# Patient Record
Sex: Female | Born: 1980 | Hispanic: Yes | Marital: Married | Smoking: Never smoker
Health system: Southern US, Community
[De-identification: ages and names within clinical notes are randomized; demographics above are authoritative.]

## PROBLEM LIST (undated history)

## (undated) DIAGNOSIS — K219 Gastro-esophageal reflux disease without esophagitis: Secondary | ICD-10-CM

## (undated) DIAGNOSIS — D649 Anemia, unspecified: Secondary | ICD-10-CM

## (undated) DIAGNOSIS — D219 Benign neoplasm of connective and other soft tissue, unspecified: Secondary | ICD-10-CM

## (undated) DIAGNOSIS — O26649 Intrahepatic cholestasis of pregnancy, unspecified trimester: Secondary | ICD-10-CM

## (undated) DIAGNOSIS — B20 Human immunodeficiency virus [HIV] disease: Secondary | ICD-10-CM

## (undated) DIAGNOSIS — O24419 Gestational diabetes mellitus in pregnancy, unspecified control: Secondary | ICD-10-CM

## (undated) DIAGNOSIS — K831 Obstruction of bile duct: Secondary | ICD-10-CM

## (undated) DIAGNOSIS — Z5189 Encounter for other specified aftercare: Secondary | ICD-10-CM

## (undated) DIAGNOSIS — Z21 Asymptomatic human immunodeficiency virus [HIV] infection status: Secondary | ICD-10-CM

## (undated) DIAGNOSIS — O26619 Liver and biliary tract disorders in pregnancy, unspecified trimester: Secondary | ICD-10-CM

## (undated) HISTORY — PX: APPENDECTOMY: SHX54

## (undated) HISTORY — DX: Anemia, unspecified: D64.9

## (undated) HISTORY — DX: Gastro-esophageal reflux disease without esophagitis: K21.9

## (undated) HISTORY — DX: Benign neoplasm of connective and other soft tissue, unspecified: D21.9

## (undated) HISTORY — DX: Encounter for other specified aftercare: Z51.89

---
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
## 2019-01-14 ENCOUNTER — Ambulatory Visit: Payer: Self-pay | Attending: Internal Medicine

## 2019-01-14 DIAGNOSIS — Z20822 Contact with and (suspected) exposure to covid-19: Secondary | ICD-10-CM

## 2019-01-14 DIAGNOSIS — U071 COVID-19: Secondary | ICD-10-CM | POA: Insufficient documentation

## 2019-01-15 LAB — NOVEL CORONAVIRUS, NAA: SARS-CoV-2, NAA: DETECTED — AB

## 2019-03-17 ENCOUNTER — Emergency Department (HOSPITAL_COMMUNITY): Payer: Self-pay

## 2019-03-17 ENCOUNTER — Other Ambulatory Visit: Payer: Self-pay

## 2019-03-17 ENCOUNTER — Encounter (HOSPITAL_COMMUNITY): Payer: Self-pay

## 2019-03-17 ENCOUNTER — Emergency Department (HOSPITAL_COMMUNITY)
Admission: EM | Admit: 2019-03-17 | Discharge: 2019-03-17 | Disposition: A | Discharge status: Home / Self Care | Payer: Self-pay | Attending: Emergency Medicine | Admitting: Emergency Medicine

## 2019-03-17 DIAGNOSIS — Z79899 Other long term (current) drug therapy: Secondary | ICD-10-CM | POA: Insufficient documentation

## 2019-03-17 DIAGNOSIS — D259 Leiomyoma of uterus, unspecified: Secondary | ICD-10-CM | POA: Insufficient documentation

## 2019-03-17 DIAGNOSIS — R1030 Lower abdominal pain, unspecified: Secondary | ICD-10-CM | POA: Insufficient documentation

## 2019-03-17 LAB — URINALYSIS, ROUTINE W REFLEX MICROSCOPIC
Bilirubin Urine: NEGATIVE
Glucose, UA: NEGATIVE mg/dL
Hgb urine dipstick: NEGATIVE
Ketones, ur: NEGATIVE mg/dL
Leukocytes,Ua: NEGATIVE
Nitrite: POSITIVE — AB
Protein, ur: NEGATIVE mg/dL
Specific Gravity, Urine: 1.013 (ref 1.005–1.030)
pH: 5 (ref 5.0–8.0)

## 2019-03-17 LAB — CBC
HCT: 41.3 % (ref 36.0–46.0)
Hemoglobin: 12.9 g/dL (ref 12.0–15.0)
MCH: 26.3 pg (ref 26.0–34.0)
MCHC: 31.2 g/dL (ref 30.0–36.0)
MCV: 84.1 fL (ref 80.0–100.0)
Platelets: 233 10*3/uL (ref 150–400)
RBC: 4.91 MIL/uL (ref 3.87–5.11)
RDW: 14.6 % (ref 11.5–15.5)
WBC: 4.4 10*3/uL (ref 4.0–10.5)
nRBC: 0 % (ref 0.0–0.2)

## 2019-03-17 LAB — COMPREHENSIVE METABOLIC PANEL
ALT: 113 U/L — ABNORMAL HIGH (ref 0–44)
AST: 96 U/L — ABNORMAL HIGH (ref 15–41)
Albumin: 3.8 g/dL (ref 3.5–5.0)
Alkaline Phosphatase: 59 U/L (ref 38–126)
Anion gap: 8 (ref 5–15)
BUN: 11 mg/dL (ref 6–20)
CO2: 20 mmol/L — ABNORMAL LOW (ref 22–32)
Calcium: 8.9 mg/dL (ref 8.9–10.3)
Chloride: 108 mmol/L (ref 98–111)
Creatinine, Ser: 0.72 mg/dL (ref 0.44–1.00)
GFR calc Af Amer: >60 mL/min (ref >60)
GFR calc non Af Amer: >60 mL/min (ref >60)
Glucose, Bld: 106 mg/dL — ABNORMAL HIGH (ref 70–99)
Potassium: 4.9 mmol/L (ref 3.5–5.1)
Sodium: 136 mmol/L (ref 135–145)
Total Bilirubin: 0.9 mg/dL (ref 0.3–1.2)
Total Protein: 7.1 g/dL (ref 6.5–8.1)

## 2019-03-17 LAB — WET PREP, GENITAL
Clue Cells Wet Prep HPF POC: NONE SEEN
Sperm: NONE SEEN
Trich, Wet Prep: NONE SEEN
Yeast Wet Prep HPF POC: NONE SEEN

## 2019-03-17 LAB — LIPASE, BLOOD: Lipase: 32 U/L (ref 11–51)

## 2019-03-17 LAB — I-STAT BETA HCG BLOOD, ED (MC, WL, AP ONLY): I-stat hCG, quantitative: <5 m[IU]/mL (ref <5)

## 2019-03-17 LAB — HIV ANTIBODY (ROUTINE TESTING W REFLEX): HIV Screen 4th Generation wRfx: REACTIVE — AB

## 2019-03-17 MED ORDER — IOHEXOL 300 MG/ML  SOLN
100.0000 mL | Freq: Once | INTRAMUSCULAR | Status: AC | PRN
  Administered 2019-03-17: 100 mL via INTRAVENOUS

## 2019-03-17 MED ORDER — MORPHINE SULFATE (PF) 4 MG/ML IV SOLN
4.0000 mg | Freq: Once | INTRAVENOUS | Status: AC
  Administered 2019-03-17: 4 mg via INTRAVENOUS
  Filled 2019-03-17: qty 1

## 2019-03-17 MED ORDER — KETOROLAC TROMETHAMINE 30 MG/ML IJ SOLN
30.0000 mg | Freq: Once | INTRAMUSCULAR | Status: AC
  Administered 2019-03-17: 30 mg via INTRAVENOUS
  Filled 2019-03-17: qty 1

## 2019-03-17 NOTE — ED Provider Notes (Signed)
[place] EMERGENCY DEPARTMENT Provider Note   CSN: [id number] Arrival date & time: 03/17/19  1013     History Chief Complaint  Patient presents with  . Abdominal Pain    Miranda George is a 39 y.o. female presents for evaluation of acute onset, progressively worsening left lower quadrant abdominal pain.  Reports that symptoms began a couple of months ago but were intermittent, over the last 2 weeks the pain has been constant and severely worsened last night.  Pain is sharp, radiates to the lower back.  It worsens with ambulation and certain movements and bending.  Denies fevers, nausea, vomiting, chest pain, shortness of breath, diarrhea or constipation.  No urinary symptoms.  She denies any vaginal itching, bleeding, or discharge out of the ordinary.  Has been taking Tylenol without relief of symptoms.  She is primarily Spanish-speaking and a Optometrist was used throughout the encounter.  The history is provided by the patient. The history is limited by a language barrier. A language interpreter was used.       History reviewed. No pertinent past medical history.  There are no problems to display for this patient.   Past Surgical History:  Procedure Laterality Date  . APPENDECTOMY     18 years ago  . CESAREAN SECTION  2012     OB History   No obstetric history on file.     No family history on file.  Social History   Tobacco Use  . Smoking status: Not on file  Substance Use Topics  . Alcohol use: Not on file  . Drug use: Not on file    Home Medications Prior to Admission medications   Medication Sig Start Date End Date Taking? Authorizing Provider  acetaminophen (TYLENOL) 500 MG tablet Take 500-1,000 mg by mouth every 6 (six) hours as needed for mild pain.   Yes [provider]    Allergies    Patient has no known allergies.  Review of Systems   Review of Systems  Constitutional: Negative for chills and fever.  Respiratory:  Negative for shortness of breath.   Cardiovascular: Negative for chest pain.  Gastrointestinal: Positive for abdominal pain. Negative for constipation, diarrhea, nausea and vomiting.  Genitourinary: Negative for dysuria, frequency, hematuria, urgency, vaginal bleeding, vaginal discharge and vaginal pain.  All other systems reviewed and are negative.   Physical Exam Updated Vital Signs BP 118/66   Pulse 75   Temp (!) 97.5 F (36.4 C) (Oral)   Resp 20   Ht 5' (1.524 m)   Wt 74.8 kg   LMP 02/20/2019   SpO2 99%   BMI 32.22 kg/m   Physical Exam Vitals and nursing note reviewed.  Constitutional:      General: She is not in acute distress.    Appearance: She is well-developed. She is obese.  HENT:     Head: Normocephalic and atraumatic.  Eyes:     General:        Right eye: No discharge.        Left eye: No discharge.     Conjunctiva/sclera: Conjunctivae normal.  Neck:     Vascular: No JVD.     Trachea: No tracheal deviation.  Cardiovascular:     Rate and Rhythm: Normal rate and regular rhythm.  Pulmonary:     Effort: Pulmonary effort is normal.     Breath sounds: Normal breath sounds.  Abdominal:     General: Abdomen is flat. Bowel sounds are normal. There is  no distension.     Palpations: Abdomen is soft.     Tenderness: There is abdominal tenderness in the suprapubic area and left lower quadrant. There is right CVA tenderness, left CVA tenderness and guarding. There is no rebound. Negative signs include Murphy's sign and Rovsing's sign.     Comments: Voluntary guarding noted  Genitourinary:    Uterus: Enlarged. Not tender.      Comments: Examination performed in the presence of a chaperone.  No masses or lesions to the external genitalia.  Moderate amount of thin clear yellow discharge.  No cervical motion tenderness or adnexal tenderness.  Uterus does appear mildly enlarged palpation Skin:    General: Skin is warm and dry.     Findings: No erythema.  Neurological:      Mental Status: She is alert.  Psychiatric:        Behavior: Behavior normal.     ED Results / Procedures / Treatments   Labs (all labs ordered are listed, but only abnormal results are displayed) Labs Reviewed  WET PREP, GENITAL - Abnormal; Notable for the following components:      Result Value   WBC, Wet Prep HPF POC MANY (*)    All other components within normal limits  COMPREHENSIVE METABOLIC PANEL - Abnormal; Notable for the following components:   CO2 20 (*)    Glucose, Bld 106 (*)    AST 96 (*)    ALT 113 (*)    All other components within normal limits  URINALYSIS, ROUTINE W REFLEX MICROSCOPIC - Abnormal; Notable for the following components:   Nitrite POSITIVE (*)    Bacteria, UA RARE (*)    All other components within normal limits  URINE CULTURE  LIPASE, BLOOD  CBC  RPR  HIV ANTIBODY (ROUTINE TESTING W REFLEX)  I-STAT BETA HCG BLOOD, ED ([facility], [facility], [facility] ONLY)  GC/CHLAMYDIA PROBE AMP ([place]) NOT AT [facility]    EKG None  Radiology CT ABDOMEN PELVIS W CONTRAST  Result Date: 03/17/2019 CLINICAL DATA:  Acute on chronic left lower quadrant abdominal pain. EXAM: CT ABDOMEN AND PELVIS WITH CONTRAST TECHNIQUE: Multidetector CT imaging of the abdomen and pelvis was performed using the standard protocol following bolus administration of intravenous contrast. CONTRAST:  135mL OMNIPAQUE IOHEXOL 300 MG/ML  SOLN COMPARISON:  None. FINDINGS: Lower chest: No acute abnormality. Hepatobiliary: No focal liver abnormality is seen. No gallstones, gallbladder wall thickening, or biliary dilatation. Pancreas: Unremarkable. No pancreatic ductal dilatation or surrounding inflammatory changes. Spleen: Normal in size without focal abnormality. Adrenals/Urinary Tract: Adrenal glands are unremarkable. Kidneys are normal, without renal calculi, focal lesion, or hydronephrosis. Bladder is unremarkable. Stomach/Bowel: The stomach appears normal. There is no evidence of bowel obstruction or  inflammation. Patient is status post appendectomy. Vascular/Lymphatic: No significant vascular findings are present. No enlarged abdominal or pelvic lymph nodes. Reproductive: 4 cm uterine fibroid is noted. No significant adnexal abnormality is noted. Other: No abdominal wall hernia or abnormality. No abdominopelvic ascites. Musculoskeletal: No acute or significant osseous findings. IMPRESSION: 4 cm uterine fibroid. No other abnormality seen in the abdomen or pelvis. Electronically Signed   By: Marijo Conception M.D.   On: 03/17/2019 14:47    Procedures Procedures (including critical care time)  Medications Ordered in ED Medications  morphine 4 MG/ML injection 4 mg (4 mg Intravenous Given 03/17/19 1336)  iohexol (OMNIPAQUE) 300 MG/ML solution 100 mL (100 mLs Intravenous Contrast Given 03/17/19 1432)    ED Course  I have reviewed the triage  vital signs and the nursing notes.  Pertinent labs & imaging results that were available during my care of the patient were reviewed by me and considered in my medical decision making (see chart for details).    MDM Rules/Calculators/A&P                      Patient presenting for evaluation of lower abdominal pain intermittently for several months, worsening over the last 2 weeks and severe since yesterday.  She is afebrile, vital signs are stable.  She is nontoxic in appearance.  No peritoneal signs on examination of the abdomen.  Lab work reviewed and interpreted by myself shows no leukocytosis, no anemia, no metabolic derangements, no renal insufficiency.  Her LFTs are mildly elevated though she has no right upper quadrant abdominal pain and her CT scan shows no evidence of any hepatobiliary disease.  I am unsure of the clinical significance but I do not feel that it is contributing to her symptoms.  Her CT scan shows a 4 cm uterine fibroid could certainly be causing her symptoms.  Pelvic examination is not concerning for PID, TOA, ovarian torsion, or ectopic  pregnancy.  Wet prep does not show evidence of yeast or BV.  No evidence of acute surgical abdominal pathology including obstruction, perforation, appendicitis, cholecystitis, dissection, or diverticulitis.  On reevaluation patient resting comfortably in no apparent distress, reports that she has had improvement in her pain.  Serial abdominal examinations remain benign.  Recommend follow-up with OB/GYN for reevaluation of symptoms.  Discussed strict ED return precautions. Patient verbalized understanding of and agreement with plan and is safe for discharge home at this time.  A translator was used throughout the encounter.   Final Clinical Impression(s) / ED Diagnoses Final diagnoses:  Lower abdominal pain  Uterine leiomyoma, unspecified location    Rx / DC Orders ED Discharge Orders    None       Debroah Baller 03/17/19 1620    Truddie Hidden, MD 03/17/19 1622

## 2019-03-17 NOTE — ED Triage Notes (Signed)
Spanish interpreter used for triage:  Pt reports intermittent LLQ pain for weeks now. Denies n/v. Denies vaginal bleeding or discharge.  LMP 2/5.

## 2019-03-17 NOTE — Discharge Instructions (Addendum)
Su tomografa computarizada mostr evidencia de fibromas uterinos. Puede tomar de 1 a 2 tabletas de Tylenol (350 mg-1000 mg dependiendo de la dosis) cada 6 horas segn sea necesario para el dolor. No exceda los 4000 mg de Tylenol al da. Si el dolor persiste, puede tomar una dosis de ibuprofeno entre dosis de Tylenol. Por lo general, recomiendo de 400 a 600 mg de ibuprofeno cada 6 horas. Tmelo con comida para evitar problemas de estmago. Haga un seguimiento con un obstetra / gineclogo para reevaluar sus sntomas. Regrese al departamento de emergencias si se presentan signos o sntomas preocupantes, como fiebre alta, vmitos persistentes o dolor intenso que empeora.  Your CT scan showed evidence of uterine fibroids. You can take 1 to 2 tablets of Tylenol (350mg -1000mg  depending on the dose) every 6 hours as needed for pain.  Do not exceed 4000 mg of Tylenol daily.  If your pain persists you can take a dose of ibuprofen in between doses of Tylenol.  I usually recommend 400 to 600 mg of ibuprofen every 6 hours.  Take this with food to avoid upset stomach issues. Follow-up with OB/GYN for reevaluation of your symptoms.  Return to the emergency department if any concerning signs or symptoms develop such as high fevers, persistent vomiting, or severe worsening pain.

## 2019-03-18 LAB — RPR: RPR Ser Ql: NONREACTIVE

## 2019-03-18 LAB — GC/CHLAMYDIA PROBE AMP (~~LOC~~) NOT AT ARMC
Chlamydia: NEGATIVE
Neisseria Gonorrhea: NEGATIVE

## 2019-03-19 ENCOUNTER — Telehealth: Payer: Self-pay

## 2019-03-19 LAB — URINE CULTURE: Culture: >=100000 — AB

## 2019-03-19 NOTE — Telephone Encounter (Signed)
Spanish patient called , called [place] id# [id number] to assist, patient went to ED on Tuesday 05/17/19, diagnosed with Fibroids.Patioent request follow up appointment to be seen to manage Fibroids. Advised will send request to front desk to call her for appointment. Patient verbalized understanding.

## 2019-03-20 ENCOUNTER — Telehealth: Payer: Self-pay | Admitting: *Deleted

## 2019-03-20 NOTE — Telephone Encounter (Signed)
Post ED Visit - Positive Culture Follow-up  Culture report reviewed by antimicrobial stewardship pharmacist: [place] Team []  Elenor Quinones, Pharm.D. []  Heide Guile, Pharm.D., BCPS AQ-ID []  Parks Neptune, Pharm.D., BCPS []  Alycia Rossetti, Pharm.D., BCPS []  [place], Pharm.D., BCPS, AAHIVP []  Legrand Como, Pharm.D., BCPS, AAHIVP []  Salome Arnt, PharmD, BCPS []  Johnnette Gourd, PharmD, BCPS []  Hughes Better, PharmD, BCPS []  Leeroy Cha, PharmD []  Laqueta Linden, PharmD, BCPS []  Albertina Parr, PharmD  [place] Team []  Leodis Sias, PharmD []  Lindell Spar, PharmD []  Royetta Asal, PharmD []  Graylin Shiver, Rph []  Rema Fendt) Glennon Mac, PharmD []  Arlyn Dunning, PharmD []  Netta Cedars, PharmD []  Dia Sitter, PharmD []  Leone Haven, PharmD []  Gretta Arab, PharmD []  Theodis Shove, PharmD []  Peggyann Juba, PharmD []  Reuel Boom, PharmD   Positive urine culture, reviewed by Alyse Low, PA  Asymptomatic bacteriuria and no further patient follow-up is required at this time.  Harlon Flor [facility] 03/20/2019, 10:11 AM

## 2019-03-24 ENCOUNTER — Telehealth: Payer: Self-pay | Admitting: *Deleted

## 2019-03-24 NOTE — Telephone Encounter (Signed)
Received notice of reactive HIV screen 4th gen with reflex, confirmatory HIV 1/2 AB differentiation lab still pending (not received per [facility]).  Will send to [facility]. Landis Gandy, RN

## 2019-04-20 ENCOUNTER — Telehealth: Payer: Self-pay | Admitting: Family Medicine

## 2019-04-20 NOTE — Telephone Encounter (Signed)
Called patient with spanish interpreter Miranda George, she left patient a message to call our office back about appointment change.

## 2019-04-23 ENCOUNTER — Encounter: Payer: Self-pay | Admitting: Obstetrics and Gynecology

## 2019-05-05 ENCOUNTER — Telehealth: Payer: Self-pay | Admitting: Family Medicine

## 2019-05-05 NOTE — Telephone Encounter (Signed)
Attempted to contact patient w/ Spanish interpreter Verdis Frederickson to give her new appointment information. No answer, Verdis Frederickson left a voicemail for the patient with her new appointment information and address ( 5/6 @8 :15)

## 2019-05-18 ENCOUNTER — Encounter: Payer: Self-pay | Admitting: Obstetrics & Gynecology

## 2019-05-21 ENCOUNTER — Encounter: Payer: Self-pay | Admitting: Family Medicine

## 2019-05-21 NOTE — Progress Notes (Signed)
Patient did not keep appointment today. She may call to reschedule.  

## 2019-06-18 ENCOUNTER — Telehealth: Payer: Self-pay | Admitting: *Deleted

## 2019-06-18 NOTE — Telephone Encounter (Signed)
Re-referred to [facility].  Per Dolan Amen, unable to pull HIV confirmation labs.  DIS will contact patient for follow up testing and referral if appropriate. Landis Gandy, RN

## 2019-07-07 ENCOUNTER — Telehealth: Payer: Self-pay | Admitting: *Deleted

## 2019-07-07 NOTE — Telephone Encounter (Signed)
RN connected to [place] at [facility] to follow up on patient's status.  He states he will reach out to [facility], will update me. Landis Gandy, RN

## 2019-07-29 ENCOUNTER — Telehealth: Payer: Self-pay

## 2019-07-29 NOTE — Telephone Encounter (Signed)
Miranda George called with [facility] to let Miranda Lull, RN know that he could not get in contact with the patient. He states he has tried to call the number provided by our office several times and no answer.  Miranda George Miranda George [place]

## 2019-07-29 NOTE — Telephone Encounter (Signed)
RN spoke with Gwenlyn Perking. He states that they have tried repeatedly to contact this patient but all information that was supplied at her ER visit was inaccurate or no longer valid, including her emergency contact.  Patient and her emergency contact are not present at the reported address and the phone numbers documented in the chart no longer belong to the patient or her emergency contact.   Patient's confirmatory HIV lab was never processed, per Dolan Amen.  [facility] is unable to escalate the patient to [place] without confirmatory lab.  At best, all DIS can offer is surveillance for her name to come up in their local database and the state level database for another STI or HIV confirmation. Landis Gandy, RN

## 2019-11-05 ENCOUNTER — Telehealth: Payer: Self-pay | Admitting: *Deleted

## 2019-11-05 DIAGNOSIS — Z23 Encounter for immunization: Secondary | ICD-10-CM | POA: Diagnosis not present

## 2019-11-05 DIAGNOSIS — O34211 Maternal care for low transverse scar from previous cesarean delivery: Secondary | ICD-10-CM | POA: Diagnosis not present

## 2019-11-05 DIAGNOSIS — Z8759 Personal history of other complications of pregnancy, childbirth and the puerperium: Secondary | ICD-10-CM | POA: Diagnosis not present

## 2019-11-05 DIAGNOSIS — O99019 Anemia complicating pregnancy, unspecified trimester: Secondary | ICD-10-CM | POA: Diagnosis not present

## 2019-11-05 DIAGNOSIS — Z9049 Acquired absence of other specified parts of digestive tract: Secondary | ICD-10-CM | POA: Diagnosis not present

## 2019-11-05 DIAGNOSIS — R81 Glycosuria: Secondary | ICD-10-CM | POA: Diagnosis not present

## 2019-11-05 DIAGNOSIS — O0930 Supervision of pregnancy with insufficient antenatal care, unspecified trimester: Secondary | ICD-10-CM | POA: Diagnosis not present

## 2019-11-05 DIAGNOSIS — H11002 Unspecified pterygium of left eye: Secondary | ICD-10-CM | POA: Diagnosis not present

## 2019-11-05 DIAGNOSIS — L299 Pruritus, unspecified: Secondary | ICD-10-CM | POA: Diagnosis not present

## 2019-11-05 DIAGNOSIS — O09519 Supervision of elderly primigravida, unspecified trimester: Secondary | ICD-10-CM | POA: Diagnosis not present

## 2019-11-05 DIAGNOSIS — Z3482 Encounter for supervision of other normal pregnancy, second trimester: Secondary | ICD-10-CM | POA: Diagnosis not present

## 2019-11-05 DIAGNOSIS — K219 Gastro-esophageal reflux disease without esophagitis: Secondary | ICD-10-CM | POA: Diagnosis not present

## 2019-11-05 DIAGNOSIS — O98719 Human immunodeficiency virus [HIV] disease complicating pregnancy, unspecified trimester: Secondary | ICD-10-CM | POA: Diagnosis not present

## 2019-11-05 DIAGNOSIS — O26842 Uterine size-date discrepancy, second trimester: Secondary | ICD-10-CM | POA: Diagnosis not present

## 2019-11-05 DIAGNOSIS — Z789 Other specified health status: Secondary | ICD-10-CM | POA: Diagnosis not present

## 2019-11-05 LAB — CYTOLOGY - PAP
Drug Screen, Urine: NEGATIVE
Glucose 1 Hour: 107
Pap: NEGATIVE
Urine Culture, OB: NO GROWTH

## 2019-11-05 LAB — DRUG SCREEN, URINE: Drug Screen, Urine: NEGATIVE

## 2019-11-05 LAB — OB RESULTS CONSOLE HEPATITIS B SURFACE ANTIGEN: Hepatitis B Surface Ag: NEGATIVE

## 2019-11-05 LAB — OB RESULTS CONSOLE VARICELLA ZOSTER ANTIBODY, IGG: Varicella: IMMUNE

## 2019-11-05 LAB — OB RESULTS CONSOLE HGB/HCT, BLOOD
HCT: 37 (ref 29–41)
Hemoglobin: 12.2

## 2019-11-05 LAB — OB RESULTS CONSOLE ABO/RH: RH Type: POSITIVE

## 2019-11-05 LAB — OB RESULTS CONSOLE HIV ANTIBODY (ROUTINE TESTING): HIV: REACTIVE

## 2019-11-05 LAB — OB RESULTS CONSOLE GC/CHLAMYDIA
Chlamydia: NEGATIVE
Gonorrhea: NEGATIVE

## 2019-11-05 LAB — OB RESULTS CONSOLE ANTIBODY SCREEN: Antibody Screen: NEGATIVE

## 2019-11-05 LAB — OB RESULTS CONSOLE PLATELET COUNT: Platelets: 265

## 2019-11-05 LAB — HEPATITIS C ANTIBODY
HCV Ab: NEGATIVE
HCV Ab: NEGATIVE

## 2019-11-05 LAB — OB RESULTS CONSOLE RUBELLA ANTIBODY, IGM: Rubella: IMMUNE

## 2019-11-05 LAB — OB RESULTS CONSOLE RPR: RPR: NONREACTIVE

## 2019-11-05 NOTE — Telephone Encounter (Signed)
Patient found by Corene Cornea at [facility], came in to the health department today. Patient had presumptive rapid positive HIV test followed by confirmatory bloodwork (results available early next week).  Corene Cornea is on the phone with her, would like to schedule appointments while he is in contact with her.  RN scheduled appointments, will follow confirmatory test. Landis Gandy, RN

## 2019-11-06 ENCOUNTER — Telehealth: Payer: Self-pay

## 2019-11-06 NOTE — Telephone Encounter (Signed)
[facility] Patient Advocate Encounter ? ?Insurance verification completed.   ? ?The patient is uninsured and will need patient assistance for medication. ? ?We can complete the application and will need to meet with the patient for signatures and income documentation. ? ?Dessire Grimes, CPhT ?Specialty Pharmacy Patient Advocate ?[facility] ?Phone: [phone] ?Fax:  [phone]  ?

## 2019-11-09 ENCOUNTER — Encounter: Payer: Self-pay | Admitting: *Deleted

## 2019-11-09 DIAGNOSIS — O26843 Uterine size-date discrepancy, third trimester: Secondary | ICD-10-CM | POA: Diagnosis not present

## 2019-11-11 ENCOUNTER — Ambulatory Visit: Payer: Medicaid Other | Attending: Family | Admitting: Genetic Counselor

## 2019-11-11 ENCOUNTER — Other Ambulatory Visit: Payer: Self-pay | Admitting: *Deleted

## 2019-11-11 ENCOUNTER — Other Ambulatory Visit: Payer: Self-pay

## 2019-11-11 DIAGNOSIS — O09529 Supervision of elderly multigravida, unspecified trimester: Secondary | ICD-10-CM

## 2019-11-11 DIAGNOSIS — Z315 Encounter for genetic counseling: Secondary | ICD-10-CM

## 2019-11-11 DIAGNOSIS — Z113 Encounter for screening for infections with a predominantly sexual mode of transmission: Secondary | ICD-10-CM

## 2019-11-11 DIAGNOSIS — Z1371 Encounter for nonprocreative screening for genetic disease carrier status: Secondary | ICD-10-CM

## 2019-11-11 DIAGNOSIS — Z36 Encounter for antenatal screening for chromosomal anomalies: Secondary | ICD-10-CM | POA: Diagnosis not present

## 2019-11-11 DIAGNOSIS — B20 Human immunodeficiency virus [HIV] disease: Secondary | ICD-10-CM

## 2019-11-11 DIAGNOSIS — Z79899 Other long term (current) drug therapy: Secondary | ICD-10-CM

## 2019-11-11 NOTE — Progress Notes (Addendum)
11/11/2019  Miranda George 02-26-1980 MRN: [id number] DOV: 11/11/2019  Miranda George presented to the [facility] for a genetics consultation regarding advanced maternal age. Miranda George presented to her appointment alone. This session was facilitated by a [place] interpreter.   Indication for genetic counseling - Advanced maternal age  Prenatal history  Miranda George is a [id number], 39 y.o. female. Per records we originally received, her current pregnancy has completed [redacted]w[redacted]d (Estimated Date of Delivery: 02/04/20). Miranda George has a 37 year old son from her prior marriage. She also had a miscarriage with her ex-husband.   Of note, Miranda George informed me that she had a dating ultrasound at the [facility] Department this week that indicated that she is 12 weeks' gestation, not nearly 28 weeks. She was initially dated by LMP but reported a history of irregular periods. We will request a copy of the ultrasound report and update her EDD accordingly.  Miranda George denied exposure to environmental toxins or chemical agents. She denied the use of alcohol, tobacco or street drugs. She reported taking prenatal vitamins. She denied significant viral illnesses, fevers, and bleeding during the course of her pregnancy. Per records, her son was born via Cesarean section. She reported that she was recently diagnosed with HIV and that her husband has tested negative. Her medical and surgical histories were otherwise noncontributory.  Family History  A three generation pedigree was drafted and reviewed. Both family histories were reviewed and found to be noncontributory for birth defects, intellectual disability, recurrent pregnancy loss, and known genetic conditions. Miranda George had limited information about her partner's family history; thus, risk assessment was limited.  The patient's ancestry is [place]. The father of the pregnancy's ancestry is [place].  Ashkenazi Jewish ancestry and consanguinity were denied. Pedigree will be scanned under Media.  Discussion  Advanced maternal age:  Miranda George was referred to genetic counseling for advanced maternal age, as she will be 39 years old at the time of delivery. We discussed that as a woman ages, the risk for certain chromosomal conditions, such as trisomy 65 (Down syndrome), trisomy 83, and trisomy 18 increases. These conditions often are not inherited, but instead occur due to an error in chromosomal division during the formation of sperm and egg cells in a process called nondisjunction. At her age and during the first trimester, Miranda George has approximately a 1 in 20 (1.5%) chance of having a child with a chromosomal abnormality. Her age-related risk to have a child with Down syndrome specifically is 1 in 132 (0.8%) in the first trimester. We briefly reviewed features associated with Down syndrome, trisomy 18, and trisomy 68.    We reviewed noninvasive prenatal screening (NIPS) as an available screening option for chromosomal aneuploidies. Specifically, we discussed that NIPS analyzes cell free DNA originating from the placenta that is found in the maternal blood circulation during pregnancy. This test is not diagnostic for chromosome conditions, but can provide information regarding the presence or absence of extra fetal DNA for chromosomes 13, 18 21, and the sex chromosomes. Thus, it would not identify or rule out all fetal aneuploidy. The reported detection rate is 91-99% for trisomies 21, 18, 13, and sex chromosome aneuploidies. The false positive rate is reported to be less than 0.1% for any of these conditions. Miranda George indicated that she is interested in pursuing NIPS.    Carrier screening:  Per ACOG recommendation, carrier screening for hemoglobinopathies, cystic fibrosis (CF) and spinal muscular  atrophy (SMA) was discussed including information about the conditions, rationale for testing,  autosomal recessive inheritance, and the option of prenatal diagnosis. Per records, it appears that Miranda George may have had carrier screening for CF and hemoglobinopathies with her OBGYN provider. However, these reports were not available for my review. I offered additional carrier screening SMA, which Miranda George accepted at this time. Miranda George was informed that select hemoglobinopathies, CF, and SMA are included on [place]'s newborn screen. I will request a copy of her CF and hemoglobinopathy carrier screening results.  Diagnostic testing:  Miranda George was also counseled regarding the option of diagnostic testing via chorionic villus sampling (CVS) or amniocentesis. We discussed the technical aspects of each procedure and quoted up to a 1 in 500 (0.2%) risk for spontaneous pregnancy loss or other adverse pregnancy outcomes as a result of either procedure. Cultured cells from either a placental or amniotic fluid sample allow for the visualization of a fetal karyotype, which can detect >99% of large chromosomal aberrations. Chromosomal microarray can also be performed to identify smaller deletions or duplications of fetal chromosomal material. Miranda George was informed that diagnostic testing is the only way to definitively determine if a fetus has a chromosomal aneuploidy prenatally. After careful consideration, Miranda George declined diagnostic testing at this time. She understands that diagnostic testing is available at any point through the end of pregnancy and that she may opt to undergo the procedure at a later date should she change her mind.   Plan:  Miranda George had her blood drawn for MaterniT21 NIPS and SMA carrier screening through [facility] today. Results from [place] will take approximately one week to be returned. Results from carrier screening will take 2-3 weeks to be returned. I will call Miranda George when results become available. We also discussed that it is recommended that Ms.  Ramirez's OBGYN provider refer her to Maternal Fetal Medicine for her anatomy ultrasound around 18-20 weeks' gestation.   I counseled Miranda George regarding the above risks and available options. The approximate face-to-face time with the genetic counselor was 45 minutes.  In summary:  Discussed advanced maternal age options for follow-up testing  1 in 72 (1.5%) chance of having child with chromosomal aneuploidy  Had sample drawn for MaterniT21 NIPS. We will follow results  Discussed carrier screening for cystic fibrosis, spinal muscular atrophy, and hemoglobinopathies  May have had carrier screening for CF and hemoglobinopathies (result not available for my review). Will request copy of reports  Opted to undergo carrier screening for SMA. We will follow results  Offered additional testing and screening  Declined diagnostic testing  Recommend referral to MFM for anatomy ultrasound  Reviewed family history concerns   Buelah Manis, MS, [facility] Genetic Counselor

## 2019-11-11 NOTE — Telephone Encounter (Signed)
Cassie to follow with [facility] on proof of positivity, as this was not resulted before 10/27. Jason's cell - [phone] Landis Gandy, RN

## 2019-11-12 ENCOUNTER — Encounter: Payer: Self-pay | Admitting: *Deleted

## 2019-11-12 ENCOUNTER — Ambulatory Visit: Payer: Medicaid Other

## 2019-11-12 ENCOUNTER — Telehealth: Payer: Self-pay

## 2019-11-12 ENCOUNTER — Other Ambulatory Visit (HOSPITAL_COMMUNITY)
Admission: RE | Admit: 2019-11-12 | Discharge: 2019-11-12 | Disposition: A | Discharge status: Home / Self Care | Payer: Medicaid Other | Source: Ambulatory Visit | Attending: Internal Medicine | Admitting: Internal Medicine

## 2019-11-12 ENCOUNTER — Ambulatory Visit: Payer: Self-pay | Admitting: Genetic Counselor

## 2019-11-12 ENCOUNTER — Other Ambulatory Visit: Payer: Self-pay | Admitting: Pharmacist

## 2019-11-12 ENCOUNTER — Other Ambulatory Visit: Payer: Medicaid Other

## 2019-11-12 ENCOUNTER — Ambulatory Visit (INDEPENDENT_AMBULATORY_CARE_PROVIDER_SITE_OTHER): Payer: Medicaid Other | Admitting: Pharmacist

## 2019-11-12 DIAGNOSIS — B20 Human immunodeficiency virus [HIV] disease: Secondary | ICD-10-CM | POA: Insufficient documentation

## 2019-11-12 DIAGNOSIS — Z79899 Other long term (current) drug therapy: Secondary | ICD-10-CM

## 2019-11-12 DIAGNOSIS — Z113 Encounter for screening for infections with a predominantly sexual mode of transmission: Secondary | ICD-10-CM | POA: Diagnosis not present

## 2019-11-12 LAB — URINALYSIS
Bilirubin Urine: NEGATIVE
Glucose, UA: NEGATIVE
Hgb urine dipstick: NEGATIVE
Ketones, ur: NEGATIVE
Nitrite: NEGATIVE
Protein, ur: NEGATIVE
Specific Gravity, Urine: 1.013 (ref 1.001–1.03)
pH: 7 (ref 5.0–8.0)

## 2019-11-12 MED ORDER — EMTRICITABINE-TENOFOVIR DF 200-300 MG PO TABS: 1.0000 | ORAL_TABLET | Freq: Every day | ORAL | 5 refills | Status: DC

## 2019-11-12 MED ORDER — TIVICAY 50 MG PO TABS: 50.0000 mg | ORAL_TABLET | Freq: Every day | ORAL | 5 refills | Status: DC

## 2019-11-12 NOTE — Progress Notes (Addendum)
HPI: Miranda George is a 39 y.o. female who presents to the [facility] clinic today to initiate care for a newly diagnosed HIV infection.  There are no problems to display for this patient.   Patient's Medications  New Prescriptions   No medications on file  Previous Medications   ACETAMINOPHEN (TYLENOL) 500 MG TABLET    Take 500-1,000 mg by mouth every 6 (six) hours as needed for mild pain.  Modified Medications   No medications on file  Discontinued Medications   No medications on file    Allergies: No Known Allergies  Past Medical History: Past Medical History:  Diagnosis Date  . Fibroid     Social History: Social History   Socioeconomic History  . Marital status: Married    Spouse name: Not on file  . Number of children: Not on file  . Years of education: Not on file  . Highest education level: Not on file  Occupational History  . Not on file  Tobacco Use  . Smoking status: Not on file  Substance and Sexual Activity  . Alcohol use: Not on file  . Drug use: Not on file  . Sexual activity: Not on file  Other Topics Concern  . Not on file  Social History Narrative  . Not on file   Social Determinants of Health   Financial Resource Strain:   . Difficulty of Paying Living Expenses: Not on file  Food Insecurity:   . Worried About Charity fundraiser in the Last Year: Not on file  . Ran Out of Food in the Last Year: Not on file  Transportation Needs:   . Lack of Transportation (Medical): Not on file  . Lack of Transportation (Non-Medical): Not on file  Physical Activity:   . Days of Exercise per Week: Not on file  . Minutes of Exercise per Session: Not on file  Stress:   . Feeling of Stress : Not on file  Social Connections:   . Frequency of Communication with Friends and Family: Not on file  . Frequency of Social Gatherings with Friends and Family: Not on file  . Attends Religious Services: Not on file  . Active Member of Clubs or Organizations: Not on  file  . Attends Archivist Meetings: Not on file  . Marital Status: Not on file    Labs: No results found for: HIV1RNAQUANT, HIV1RNAVL, CD4TABS  RPR and STI Lab Results  Component Value Date   LABRPR NON REACTIVE 03/17/2019    STI Results GC CT  03/17/2019 Negative Negative    Hepatitis B No results found for: HEPBSAB, HEPBSAG, HEPBCAB Hepatitis C No results found for: HEPCAB, HCVRNAPCRQN Hepatitis A No results found for: HAV Lipids: No results found for: CHOL, TRIG, HDL, CHOLHDL, VLDL, LDLCALC  Current HIV Regimen: Treatment naive  Assessment: This visit was conducted with video interpreter Miranda George [phone].  Miranda George is newly diagnosed with HIV and here today to establish care with [facility] and get intake labs drawn. She was initially brought to [facility]'s attention in March 2021, but we were unable to reach her to bring her in. [facility] has been working to track her down and was finally successful.   I met with patient today to initiate medication as she is currently pregnant. She tells me that she is [redacted] weeks pregnant but a recent note states that she is [redacted] weeks pregnant.  I spent time reviewing HIV and the labs associated with the diagnosis. I answered several questions  she had about the disease. We will start her on Tivicay + Truvada.   Counseled to take Tivicay and Truvada together at the same time each day and to never take one without the other. She also takes a prenatal vitamin in the morning, so I asked her to take her HIV medications at night to avoid the drug interaction with Tivicay. Counseled on what to do if a dose is missed and explained any side effects that she may experience.  Discussed that these medications have been shown to be safe for both mother and baby during pregnancy. Counseled the importance of adherence especially as she nears delivery as to not pass on the virus to her newborn. Also discussed that once she delivers, we can simplify her regimen to a one  pill once daily regimen. She had her first OB appointment yesterday.   She states that she has [employer] but we could not get her card to work. Miranda George was able to get her patient assistance through [place] and we will mail her medications to her from [facility]. Hopefully she met with financial assistance today, but if not she will need to do so at her next visit.  She has an appointment scheduled with Dr. Gale Journey on 11/11, but will reschedule her for 4 weeks out with labs beforehand.  Plan: - Start Tivicay and Truvada at night - Mail from [facility] - F/u for repeat labs 11/30 at 945am - F/u with Dr. Gale Journey 12/8 at [facility]. Miranda George, PharmD, BCIDP, AAHIVP, CPP Clinical Pharmacist Practitioner Infectious Diseases [facility] 11/12/2019, 11:05 AM

## 2019-11-12 NOTE — Telephone Encounter (Signed)
[facility] Patient Advocate Encounter  Completed and sent [employer] Advancing Access application for Truvada for this patient who is uninsured.    Patient is approved 11/12/19.  BIN      [id number] PCN    [id number] GRP    [id number] ID        [id number]  Patient was Approved for [place] for temporary PAP for Tivicay.  SFS:[id number] PCN: [id number]  HF:[id number]   , CPhT Specialty Pharmacy Patient [facility] Phone: [phone] Fax:  [phone]

## 2019-11-13 ENCOUNTER — Encounter: Payer: Self-pay | Admitting: *Deleted

## 2019-11-13 DIAGNOSIS — Z21 Asymptomatic human immunodeficiency virus [HIV] infection status: Secondary | ICD-10-CM | POA: Insufficient documentation

## 2019-11-13 DIAGNOSIS — O98719 Human immunodeficiency virus [HIV] disease complicating pregnancy, unspecified trimester: Secondary | ICD-10-CM

## 2019-11-13 DIAGNOSIS — Z98891 History of uterine scar from previous surgery: Secondary | ICD-10-CM | POA: Insufficient documentation

## 2019-11-13 DIAGNOSIS — O0990 Supervision of high risk pregnancy, unspecified, unspecified trimester: Secondary | ICD-10-CM | POA: Insufficient documentation

## 2019-11-13 DIAGNOSIS — O09529 Supervision of elderly multigravida, unspecified trimester: Secondary | ICD-10-CM | POA: Insufficient documentation

## 2019-11-13 DIAGNOSIS — B20 Human immunodeficiency virus [HIV] disease: Secondary | ICD-10-CM | POA: Insufficient documentation

## 2019-11-13 DIAGNOSIS — Z789 Other specified health status: Secondary | ICD-10-CM

## 2019-11-13 LAB — URINE CYTOLOGY ANCILLARY ONLY
Chlamydia: NEGATIVE
Comment: NEGATIVE
Comment: NORMAL
Neisseria Gonorrhea: NEGATIVE

## 2019-11-13 LAB — T-HELPER CELL (CD4) - (RCID CLINIC ONLY)
CD4 % Helper T Cell: 32 % — ABNORMAL LOW (ref 33–65)
CD4 T Cell Abs: 346 /uL — ABNORMAL LOW (ref 400–1790)

## 2019-11-16 ENCOUNTER — Encounter: Payer: Self-pay | Admitting: *Deleted

## 2019-11-17 ENCOUNTER — Telehealth: Payer: Self-pay | Admitting: Lactation Services

## 2019-11-17 ENCOUNTER — Encounter: Payer: Self-pay | Admitting: Internal Medicine

## 2019-11-17 ENCOUNTER — Telehealth: Payer: Self-pay | Admitting: Genetic Counselor

## 2019-11-17 LAB — MATERNIT21 PLUS CORE+SCA
Fetal Fraction: 9
Monosomy X (Turner Syndrome): NOT DETECTED
Result (T21): NEGATIVE
Trisomy 13 (Patau syndrome): NEGATIVE
Trisomy 18 (Edwards syndrome): NEGATIVE
Trisomy 21 (Down syndrome): NEGATIVE
XXX (Triple X Syndrome): NOT DETECTED
XXY (Klinefelter Syndrome): NOT DETECTED
XYY (Jacobs Syndrome): NOT DETECTED

## 2019-11-17 NOTE — Telephone Encounter (Signed)
LVM for Ms. Miranda George with the help of [employer] ID# [phone] re: good news about screening results. Requested a call back to my direct line to discuss these in more detail, as no identifiers were provided in voicemail message.   Buelah Manis, MS, [facility] Genetic Counselor

## 2019-11-17 NOTE — Telephone Encounter (Signed)
Patient called and LM on voicemail that she would like a call back.

## 2019-11-18 ENCOUNTER — Telehealth: Payer: Self-pay | Admitting: Genetic Counselor

## 2019-11-18 NOTE — Telephone Encounter (Addendum)
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 Specifically, Miranda George had MaterniT21 testing through [facility] was offered because of advanced maternal age. These negative results demonstrated an expected representation of chromosome 21, 64, 49, and sex chromosome material, greatly reducing the likelihood of trisomies 20, 43, or 65 and sex chromosome aneuploidies for the pregnancy. Miranda George requested to know about the expected fetal sex, which is female.  NIPS analyzes placental (fetal) DNA in maternal circulation. NIPS is considered to be highly specific and sensitive, but is not considered to be a diagnostic test. We reviewed that this testing identifies 91-99% of pregnancies with trisomies 62, 22, and 36, as well as sex chromosome abnormalities, but does not test for all genetic conditions. Miranda George was reminded that diagnostic testing is available should she be interested in confirming this result.   Miranda George did report that she recently began a new medication that is causing her to vomit. I encouraged her to call her doctor to discuss her concerns with them. She has an appointment with her OBGYN provider tomorrow, so she is going to discuss this with them then. She confirmed that she had no further questions at this time.   Miranda Manis, MS, [facility] Genetic Counselor

## 2019-11-18 NOTE — Telephone Encounter (Signed)
I called Miranda George with Interpreter [place]. She states she wants to know why someone called her. I explained it was not our office that called ;but I would notify them you called back . She voices understanding and thanked me. Per chart review was called by [facility] genetic counselor yesterday - will forward message.  Kolleen Ochsner,RN

## 2019-11-19 ENCOUNTER — Encounter: Payer: Self-pay | Admitting: Internal Medicine

## 2019-11-19 ENCOUNTER — Ambulatory Visit: Payer: Self-pay | Admitting: Pharmacist

## 2019-11-19 ENCOUNTER — Ambulatory Visit (INDEPENDENT_AMBULATORY_CARE_PROVIDER_SITE_OTHER): Payer: Medicaid Other | Admitting: Obstetrics & Gynecology

## 2019-11-19 ENCOUNTER — Other Ambulatory Visit: Payer: Self-pay

## 2019-11-19 ENCOUNTER — Encounter: Payer: Self-pay | Admitting: Obstetrics & Gynecology

## 2019-11-19 ENCOUNTER — Encounter: Payer: Self-pay | Admitting: *Deleted

## 2019-11-19 DIAGNOSIS — O0990 Supervision of high risk pregnancy, unspecified, unspecified trimester: Secondary | ICD-10-CM

## 2019-11-19 DIAGNOSIS — O98719 Human immunodeficiency virus [HIV] disease complicating pregnancy, unspecified trimester: Secondary | ICD-10-CM

## 2019-11-19 DIAGNOSIS — Z98891 History of uterine scar from previous surgery: Secondary | ICD-10-CM

## 2019-11-19 DIAGNOSIS — O09529 Supervision of elderly multigravida, unspecified trimester: Secondary | ICD-10-CM

## 2019-11-19 DIAGNOSIS — Z789 Other specified health status: Secondary | ICD-10-CM

## 2019-11-19 MED ORDER — ONDANSETRON 4 MG PO TBDP: 4.0000 mg | ORAL_TABLET | Freq: Four times a day (QID) | ORAL | 0 refills | Status: DC | PRN

## 2019-11-19 NOTE — Progress Notes (Signed)
  Subjective:transfer from HD due to pos HIV    Miranda George is a [id number] Unknown being seen today for her first obstetrical visit.  Her obstetrical history is significant for AMA, HIV. Patient does not intend to breast feed. Pregnancy history fully reviewed.  Patient reports nausea and vomiting.  Vitals:   11/19/19 1414 11/19/19 1420  BP: 113/76   Pulse: 71   Weight: 199 lb 3.2 oz (90.4 kg)   Height:  4' 10.5" (1.486 m)    HISTORY: OB History  Gravida Para Term Preterm AB Living  3 1 1   1 1   SAB TAB Ectopic Multiple Live Births  1       1    # Outcome Date GA Lbr Len/2nd Weight Sex Delivery Anes PTL Lv  3 Current           2 Term 09/28/10 [redacted]w[redacted]d  9 lb (4.082 kg) M CS-Unspec EPI  LIV     Birth Comments: c/s Arrest of Descent; postpartum hemorrhage, IOL due to size; blood transfusion     Complications: Failure to Progress in Second Stage  1 SAB 2002           Past Medical History:  Diagnosis Date  . Anemia   . Blood transfusion without reported diagnosis   . Fibroid   . GERD (gastroesophageal reflux disease)    Past Surgical History:  Procedure Laterality Date  . APPENDECTOMY     20 years ago  . CESAREAN SECTION  2012   Family History  Problem Relation Age of Onset  . Hypertension Mother   . Diabetes Mother   . Thyroid disease Mother   . Hypertension Father   . Diabetes Father      Exam    Uterus:     Pelvic Exam:                                    Skin: normal coloration and turgor, no rashes    Neurologic: oriented, normal mood   Extremities: normal strength, tone, and muscle mass   HEENT PERRLA   Mouth/Teeth     Neck supple   Cardiovascular: regular rate and rhythm   Respiratory:  appears well, vitals normal, no respiratory distress, acyanotic, normal RR   Abdomen: soft, non-tender; bowel sounds normal; no masses,  no organomegaly          Assessment:    Pregnancy: G3P1011 Patient Active Problem List   Diagnosis Date Noted  .  Supervision of high risk pregnancy, antepartum 11/13/2019  . AMA (advanced maternal age) multigravida 35+ 11/13/2019  . Language barrier 11/13/2019  . HIV disease affecting pregnancy 11/13/2019  . History of C-section 11/13/2019        Plan:     Initial labs drawn at the HD, reviewed Prenatal vitamins. Problem list reviewed and updated. Genetic Screening discussed and were sent from [facility] genetic counseling  Ultrasound discussed; fetal survey: ordered.  Follow up in 4 weeks. 50 3% of 30 min visit spent on counseling and coordination of care.  Zofran for nausea  Adjusted EDC based on verbal report of [place] from the patient, need documentation   has f/u with ID Emeterio Reeve 11/19/2019

## 2019-11-19 NOTE — Patient Instructions (Signed)
Segundo trimestre de embarazo Second Trimester of Pregnancy  El segundo trimestre va desde la semana14 hasta la 27 (desde el mes 4 hasta el 6). Este suele ser el momento en el que mejor se siente. En general, las nuseas matutinas han disminuido o han desaparecido completamente. Tendr ms energa y podr aumentarle el apetito. El beb en gestacin se desarrolla rpidamente. Hacia el final del sexto mes, el beb mide aproximadamente 9 pulgadas (23 cm) y pesa alrededor de 1 libras (700 g). Es probable que sienta al beb moverse entre las 18 y 20 semanas del embarazo. Siga estas indicaciones en su casa: Medicamentos  Tome los medicamentos de venta libre y los recetados solamente como se lo haya indicado el mdico. Algunos medicamentos son seguros para tomar durante el embarazo y otros no lo son.  Tome vitaminas prenatales que contengan por lo menos 600microgramos (?g) de cido flico.  Si tiene dificultad para mover el intestino (estreimiento), tome un medicamento para ablandar las heces (laxante) si su mdico se lo autoriza. Comida y bebida   Ingiera alimentos saludables de manera regular.  No coma carne cruda ni quesos sin cocinar.  Si obtiene poca cantidad de calcio de los alimentos que ingiere, consulte a su mdico sobre la posibilidad de tomar un suplemento diario de calcio.  Evite el consumo de alimentos ricos en grasas y azcares, como los alimentos fritos y los dulces.  Si tiene malestar estomacal (nuseas) o devuelve (vomita): ? Ingiera 4 o 5comidas pequeas por da en lugar de 3abundantes. ? Intente comer algunas galletitas saladas. ? Beba lquidos entre las comidas, en lugar de hacerlo durante estas.  Para evitar el estreimiento: ? Consuma alimentos ricos en fibra, como frutas y verduras frescas, cereales integrales y frijoles. ? Beba suficiente lquido para mantener el pis (orina) claro o de color amarillo plido. Actividad  Haga ejercicios solamente como se lo haya  indicado el mdico. Interrumpa la actividad fsica si comienza a tener calambres.  No haga ejercicio si hace demasiado calor, hay demasiada humedad o se encuentra en un lugar de mucha altura (altitud alta).  Evite levantar pesos excesivos.  Use zapatos con tacones bajos. Mantenga una buena postura al sentarse y pararse.  Puede continuar teniendo relaciones sexuales, a menos que el mdico le indique lo contrario. Alivio del dolor y del malestar  Use un sostn que le brinde buen soporte si sus mamas estn sensibles.  Dese baos de asiento con agua tibia para aliviar el dolor o las molestias causadas por las hemorroides. Use una crema para las hemorroides si el mdico la autoriza.  Descanse con las piernas elevadas si tiene calambres o dolor de cintura.  Si desarrolla venas hinchadas y abultadas (vrices) en las piernas: ? Use medias de compresin o medias de descanso como se lo haya indicado el mdico. ? Levante (eleve) los pies durante 15minutos, 3 o 4veces por da. ? Limite el consumo de sal en sus alimentos. Cuidado prenatal  Escriba sus preguntas. Llvelas cuando concurra a las visitas prenatales.  Concurra a todas las visitas prenatales como se lo haya indicado el mdico. Esto es importante. Seguridad  Colquese el cinturn de seguridad cuando conduzca.  Haga una lista de los nmeros de telfono de emergencia, que incluya los nmeros de telfono de familiares, amigos, el hospital, as como los departamentos de polica y bomberos. Instrucciones generales  Consulte a su mdico sobre los alimentos que debe comer o pdale que la ayude a encontrar a quien pueda aconsejarla si necesita ese servicio.    Consulte a su mdico acerca de dnde se dictan clases prenatales cerca de donde vive. Comience las clases antes del mes 6 de embarazo.  No se d baos de inmersin en agua caliente, baos turcos ni saunas.  No se haga duchas vaginales ni use tampones o toallas higinicas perfumadas.   No mantenga las piernas cruzadas durante mucho tiempo.  Vaya al dentista si an no lo hizo. Use un cepillo de cerdas suaves para cepillarse los dientes. Psese el hilo dental suavemente.  No fume, no consuma hierbas ni beba alcohol. No tome frmacos que el mdico no haya autorizado.  No consuma ningn producto que contenga nicotina o tabaco, como cigarrillos y cigarrillos electrnicos. Si necesita ayuda para dejar de fumar, consulte al mdico.  Evite el contacto con las bandejas sanitarias de los gatos y la tierra que estos animales usan. Estos elementos contienen bacterias que pueden causar defectos congnitos al beb y la posible prdida del beb (aborto espontneo) o la muerte fetal. Comunquese con un mdico si:  Tiene clicos leves o siente presin en la parte baja del vientre.  Tiene dolor al hacer pis (orinar).  Advierte un lquido con olor ftido que proviene de la vagina.  Tiene malestar estomacal (nuseas), devuelve (vomita) o tiene deposiciones acuosas (diarrea).  Sufre un dolor persistente en el abdomen.  Siente mareos. Solicite ayuda de inmediato si:  Tiene fiebre.  Tiene una prdida de lquido por la vagina.  Tiene sangrado o pequeas prdidas vaginales.  Siente dolor intenso o clicos en el abdomen.  Sube o baja de peso rpidamente.  Tiene dificultades para recuperar el aliento y siente dolor en el pecho.  Sbitamente se le hinchan mucho el rostro, las manos, los tobillos, los pies o las piernas.  No ha sentido los movimientos del beb durante una hora.  Siente un dolor de cabeza intenso que no se alivia al tomar medicamentos.  Tiene dificultad para ver. Resumen  El segundo trimestre va desde la semana14 hasta la 27, desde el mes 4 hasta el 6. Este suele ser el momento en el que mejor se siente.  Para cuidarse y cuidar a su beb en gestacin, debe comer alimentos saludables, tomar medicamentos solamente si su mdico le indica que lo haga y hacer  actividades que sean seguras para usted y su beb.  Llame al mdico si se enferma o si nota algo inusual acerca de su embarazo. Tambin llame al mdico si necesita ayuda para saber qu alimentos debe comer o si quiere saber qu actividades puede realizar de forma segura. Esta informacin no tiene como fin reemplazar el consejo del mdico. Asegrese de hacerle al mdico cualquier pregunta que tenga. Document Revised: 09/26/2016 Document Reviewed: 09/26/2016 Elsevier Patient Education  2020 Elsevier Inc.  

## 2019-11-19 NOTE — Progress Notes (Signed)
Here for new ob- transfer from [facility] for HIV.  Hx irreglar periods. Said [place] told her 12 weeks 2 weeks ago. Unable to get FHR with doppler. Dr. Roselie Awkward in with handheld [place] and obtained fetal movement and fetal heart activity. Does have food insecurity- will take to food market today. Has elevated phq9 and gad 7 - offered referral to [facility] which she accepted. Patient and/or legal guardian verbally consented to [facility] services about presenting concerns and psychiatric consultation as appropriate.   Zaryiah Barz,RN

## 2019-11-21 LAB — COMPLETE METABOLIC PANEL WITH GFR
AG Ratio: 1.3 (calc) (ref 1.0–2.5)
ALT: 35 U/L — ABNORMAL HIGH (ref 6–29)
AST: 32 U/L — ABNORMAL HIGH (ref 10–30)
Albumin: 3.8 g/dL (ref 3.6–5.1)
Alkaline phosphatase (APISO): 42 U/L (ref 31–125)
BUN: 10 mg/dL (ref 7–25)
CO2: 23 mmol/L (ref 20–32)
Calcium: 9.6 mg/dL (ref 8.6–10.2)
Chloride: 105 mmol/L (ref 98–110)
Creat: 0.68 mg/dL (ref 0.50–1.10)
GFR, Est African American: 130 mL/min/{1.73_m2} (ref >=60)
GFR, Est Non African American: 112 mL/min/{1.73_m2} (ref >=60)
Globulin: 2.9 g/dL (calc) (ref 1.9–3.7)
Glucose, Bld: 105 mg/dL — ABNORMAL HIGH (ref 65–99)
Potassium: 4.1 mmol/L (ref 3.5–5.3)
Sodium: 136 mmol/L (ref 135–146)
Total Bilirubin: 0.3 mg/dL (ref 0.2–1.2)
Total Protein: 6.7 g/dL (ref 6.1–8.1)

## 2019-11-21 LAB — CBC WITH DIFFERENTIAL/PLATELET
Absolute Monocytes: 294 cells/uL (ref 200–950)
Basophils Absolute: 18 cells/uL (ref 0–200)
Basophils Relative: 0.4 %
Eosinophils Absolute: 143 cells/uL (ref 15–500)
Eosinophils Relative: 3.1 %
HCT: 35.2 % (ref 35.0–45.0)
Hemoglobin: 11.6 g/dL — ABNORMAL LOW (ref 11.7–15.5)
Lymphs Abs: 1067 cells/uL (ref 850–3900)
MCH: 28.8 pg (ref 27.0–33.0)
MCHC: 33 g/dL (ref 32.0–36.0)
MCV: 87.3 fL (ref 80.0–100.0)
MPV: 9.9 fL (ref 7.5–12.5)
Monocytes Relative: 6.4 %
Neutro Abs: 3077 cells/uL (ref 1500–7800)
Neutrophils Relative %: 66.9 %
Platelets: 242 10*3/uL (ref 140–400)
RBC: 4.03 10*6/uL (ref 3.80–5.10)
RDW: 15 % (ref 11.0–15.0)
Total Lymphocyte: 23.2 %
WBC: 4.6 10*3/uL (ref 3.8–10.8)

## 2019-11-21 LAB — LIPID PANEL
Cholesterol: 154 mg/dL (ref <200)
HDL: 51 mg/dL (ref >=50)
LDL Cholesterol (Calc): 73 mg/dL (calc)
Non-HDL Cholesterol (Calc): 103 mg/dL (calc) (ref <130)
Total CHOL/HDL Ratio: 3 (calc) (ref <5.0)
Triglycerides: 198 mg/dL — ABNORMAL HIGH (ref <150)

## 2019-11-21 LAB — HIV-1 RNA ULTRAQUANT REFLEX TO GENTYP+
HIV 1 RNA Quant: 32000 copies/mL — ABNORMAL HIGH
HIV-1 RNA Quant, Log: 4.51 Log copies/mL — ABNORMAL HIGH

## 2019-11-21 LAB — QUANTIFERON-TB GOLD PLUS
Mitogen-NIL: 9.17 IU/mL
NIL: 0.03 IU/mL
QuantiFERON-TB Gold Plus: NEGATIVE
TB1-NIL: 0 IU/mL
TB2-NIL: 0 IU/mL

## 2019-11-21 LAB — HIV ANTIBODY (ROUTINE TESTING W REFLEX): HIV 1&2 Ab, 4th Generation: REACTIVE — AB

## 2019-11-21 LAB — HEPATITIS B SURFACE ANTIBODY,QUALITATIVE: Hep B S Ab: NONREACTIVE

## 2019-11-21 LAB — HEPATITIS B CORE ANTIBODY, TOTAL: Hep B Core Total Ab: NONREACTIVE

## 2019-11-21 LAB — HEPATITIS C ANTIBODY
Hepatitis C Ab: NONREACTIVE
SIGNAL TO CUT-OFF: 0.04 (ref <1.00)

## 2019-11-21 LAB — HIV-1/2 AB - DIFFERENTIATION
HIV-1 antibody: POSITIVE — AB
HIV-2 Ab: NEGATIVE

## 2019-11-21 LAB — HEPATITIS A ANTIBODY, TOTAL: Hepatitis A AB,Total: REACTIVE — AB

## 2019-11-21 LAB — HEPATITIS B SURFACE ANTIGEN: Hepatitis B Surface Ag: NONREACTIVE

## 2019-11-21 LAB — RPR: RPR Ser Ql: NONREACTIVE

## 2019-11-21 LAB — HLA B*5701: HLA-B*5701 w/rflx HLA-B High: NEGATIVE

## 2019-11-21 LAB — HIV-1 GENOTYPE: HIV-1 Genotype: DETECTED — AB

## 2019-11-24 ENCOUNTER — Other Ambulatory Visit: Payer: Self-pay

## 2019-11-24 ENCOUNTER — Ambulatory Visit: Payer: Medicaid Other | Admitting: Registered"

## 2019-11-24 ENCOUNTER — Encounter: Payer: Medicaid Other | Attending: Family Medicine | Admitting: Registered"

## 2019-11-24 ENCOUNTER — Other Ambulatory Visit: Payer: Self-pay | Admitting: *Deleted

## 2019-11-24 DIAGNOSIS — B20 Human immunodeficiency virus [HIV] disease: Secondary | ICD-10-CM | POA: Insufficient documentation

## 2019-11-24 DIAGNOSIS — Z3A00 Weeks of gestation of pregnancy not specified: Secondary | ICD-10-CM | POA: Diagnosis not present

## 2019-11-24 DIAGNOSIS — O98719 Human immunodeficiency virus [HIV] disease complicating pregnancy, unspecified trimester: Secondary | ICD-10-CM

## 2019-11-24 NOTE — Progress Notes (Signed)
Medical Nutrition Therapy:  Appt start time: 9211 end time:  1400.  This patient is accompanied in the office by her Interpreter, Marianna Fuss from [facility].  Assessment:  Importance of healthy eating due to high risk pregnancy (HIV disease affecting pregnancy). Pt also c/o acid reflux  EDD 05/23/20, [redacted]w[redacted]d Weight: 40.9 kg Next MD visit: 12/18/19 [facility] visit: (future) 12/07/19  Patient reports typical gastrointestinal issues with first trimester such as nausea and sensitivity to smells that reduce desire to in the morning and to less desire to eat meat.  Patient states sometimes she makes juice in blender using celery, beets, oranges, carrots. Pt states she stopped using aloe in her juice.   Pt reports also drinking milk based drinks occasionally. Champurrado which is a corn-flavored base, made from masa harina, and dark chocolate.  Fruits and vegetables: 3-5 servings of a variety of vegetables.  Whole grains may be limited: Pt reports she likes old fashioned oatmeal and flavors it with brown sugar, green apples and raisins.  Pt states she only variety of bread and rice she likes is white. Pt states she has tried brown rice and does not like it.  Dairy: 1-3 servings per day Protein: Patient eats beans, some meats, and some dairy. Will likely be adequate intake as she is able to eat a more substantial breakfast again.  Preferred Learning Style:   No preference indicated   Learning Readiness:   Ready  MEDICATIONS: reviewed   DIETARY INTAKE:  Usual eating pattern includes 2-3 meals and 2 snacks per day.  Everyday foods include 1/2 c coffee.     24-hr recall:  B ( AM): coffee with 1 T sugar, small (estimates 1"x3") slice of sweet bread OR yogurt, strawberries, almonds Snk ( AM): none  L ( PM): lentils with tomatoes, bacon, sausage Snk ( PM): fruit D ( PM): Chicken salad with vegetables and mayo Snk ( PM): fruit Beverages: water, milk 2-3x/week, 1/2 c coffee  Usual physical  activity: not assessed  Estimated energy needs: ~1800 calories  Progress Towards Goal(s):  New goal.   Nutritional Diagnosis:  NB-1.1 Food and nutrition-related knowledge deficit As related to whole grain.  As evidenced by pt stated not knowing the nutritional difference between white and brown rice.    Intervention:  Nutrition Education. Nutritional goals for balanced eating in pregnancy. Need for variety of foods to boost immunity. Role of sun to get adequate vitamin D for immunity. Difference between whole grain and refined grain. Foods that can contribute to heart burn. Fish recommendations in pregnancy, avoidance of listeria.  Goals: Try cooking the rice provided today with the instructions translated into Spanish.  Teaching Method Utilized:  Visual Auditory Hands on  Handouts given during visit include:  MyPlate for Moms  Instructions for cooking brown basmati rice in Spanish  Barriers to learning/adherence to lifestyle change: none  Demonstrated degree of understanding via:  Teach Back   Monitoring/Evaluation:  Dietary intake, exercise, and body weight prn.

## 2019-11-26 ENCOUNTER — Encounter: Payer: Self-pay | Admitting: Internal Medicine

## 2019-11-26 ENCOUNTER — Other Ambulatory Visit: Payer: Medicaid Other

## 2019-11-30 ENCOUNTER — Other Ambulatory Visit: Payer: Self-pay

## 2019-12-01 NOTE — BH Specialist Note (Addendum)
Integrated Behavioral Health via Telemedicine Phone (Caregility) Visit  12/01/2019 [name] [id number]  Number of [place] visits: 1 Session Start time: 10:17  Session End time: 11:10 Total time: 53 minutes  Referring Provider: Emeterio Reeve, MD Type of Service: Individual Patient/Family location: Home [facility] Provider location: [facility] for [place] [facility] at [facility]  All persons participating in visit: Patient Miranda George and Miranda George   I connected with Reni Hausner and by a video enabled telemedicine application ([place]) and verified that I am speaking with the correct person using two identifiers.   Discussed confidentiality: Yes   Confirmed demographics & insurance:  Yes   I discussed that engaging in this virtual visit, they consent to the provision of behavioral healthcare and the services will be billed under their insurance.   Patient and/or legal guardian expressed understanding and consented to virtual visit: Yes   PRESENTING CONCERNS: Patient and/or family reports the following symptoms/concerns: Pt states her primary concern today is feeling sad, stress, fatigue and  worry  after her recent HIV diagnosis, along with continued nausea (says unable to start Zofran as her pharmacy did not receive prescription);  sisters are her greatest supports. Pt also concerned about pain in the back of her neck. Duration of problem: Since HIV diagnosis; Severity of problem: moderate  STRENGTHS (Protective Factors/Coping Skills): Supportive sisters  ASSESSMENT: Patient currently experiencing Adjustment disorder with depressed mood.    GOALS ADDRESSED: Patient will: 1.  Reduce symptoms of: anxiety, depression and stress  2.  Increase knowledge and/or ability of: healthy habits, self-management skills and stress reduction  3.  Demonstrate ability to: Increase healthy adjustment to current life circumstances    Progress of Goals: Ongoing  INTERVENTIONS: Interventions utilized:  Solution-Focused Strategies and Psychoeducation and/or Health Education Standardized Assessments completed & reviewed: Not Needed  OUTCOME: Patient Response: Pt agrees to treatment plan  PLAN: 1. Follow up with behavioral health clinician on : Two weeks 2. Behavioral recommendations:  -Continue taking prenatal vitamin as prescribed -Pick up Zofran for nausea to begin taking as prescribed -Consider taking 3 deep breaths when stress begins increasing (breathe in through nose, out through mouth) to help manage emotions -Continue talking to sisters daily as able 3. Referral(s): [place] (In Clinic)  I discussed the assessment and treatment plan with the patient and/or parent/guardian. They were provided an opportunity to ask questions and all were answered. They agreed with the plan and demonstrated an understanding of the instructions.   They were advised to call back or seek an in-person evaluation as appropriate.  I discussed that the purpose of this visit is to provide behavioral health care while limiting exposure to the novel coronavirus.  Discussed there is a possibility of technology failure and discussed alternative modes of communication if that failure occurs.  [name] Anmol Fleck  Depression screen [facility] 2/9 11/25/2019 11/19/2019  Decreased Interest 0 2  Down, Depressed, Hopeless 2 2  PHQ - 2 Score 2 4  Altered sleeping 1 0  Tired, decreased energy 1 3  Change in appetite 0 0  Feeling bad or failure about yourself  0 0  Trouble concentrating 0 0  Moving slowly or fidgety/restless 0 0  Suicidal thoughts 0 0  PHQ-9 Score 4 7   GAD 7 : Generalized Anxiety Score 11/25/2019 11/19/2019  Nervous, Anxious, on Edge 0 0  Control/stop worrying 1 3  Worry too much - different things 0 2  Trouble relaxing 0 0  Restless 0 0  Easily annoyed or irritable 3 3  Afraid - awful might  happen 2 3  Total GAD 7 Score 6 11

## 2019-12-03 ENCOUNTER — Telehealth: Payer: Self-pay | Admitting: Clinical

## 2019-12-03 NOTE — Telephone Encounter (Addendum)
Intern called Pt with interpreter: Miranda George [phone]) with a reminder of her appointment on 12/07/2019 at 10:15AM. Pt confirmed she will attend the appointment.  Gertha Calkin (Supervisor: Vesta Mixer)

## 2019-12-07 ENCOUNTER — Ambulatory Visit (INDEPENDENT_AMBULATORY_CARE_PROVIDER_SITE_OTHER): Payer: Medicaid Other | Admitting: Clinical

## 2019-12-07 DIAGNOSIS — O0990 Supervision of high risk pregnancy, unspecified, unspecified trimester: Secondary | ICD-10-CM

## 2019-12-07 DIAGNOSIS — O98719 Human immunodeficiency virus [HIV] disease complicating pregnancy, unspecified trimester: Secondary | ICD-10-CM

## 2019-12-07 DIAGNOSIS — F4321 Adjustment disorder with depressed mood: Secondary | ICD-10-CM

## 2019-12-07 NOTE — BH Specialist Note (Signed)
Integrated Behavioral Health via Telemedicine Video (Caregility) Visit  12/07/2019 Miranda George [id number]  Number of [place] visits: 2 Session Start time: 9:46  Session End time: 10:15 Total time: 29 minutes  Referring Provider: Emeterio Reeve, MD Type of Service: Individual Patient/Family location: Home [facility] Provider location: [place] [facility] [place] [facility]  All persons participating in visit: Patient Miranda George and Miranda George and Miranda George interpreter [place]    I connected with Miranda George by a video enabled telemedicine application ([place]) and verified that I am speaking with the correct person using two identifiers.   Discussed confidentiality: Yes   Confirmed demographics & insurance:  Yes   I discussed that engaging in this virtual visit, they consent to the provision of behavioral healthcare and the services will be billed under their insurance.   Patient and/or legal guardian expressed understanding and consented to virtual visit: Yes   PRESENTING CONCERNS: Patient and/or family reports the following symptoms/concerns: Pt states her primary concern today is learning to accept this diagnosis; primary symptoms are overeating (as nausea decreases) and irritability. Pt uses deep breathing and spending time outdoors to cope with irritability; open to learning additional coping strategy today; will pick up nausea medication today.  Duration of problem: Current pregnancy w new diagnosis; Severity of problem: moderate  STRENGTHS (Protective Factors/Coping Skills): Social connections and Supportive sisters  ASSESSMENT: Patient currently experiencing Adjustment disorder with mixed anxiety and depressed mood.    GOALS ADDRESSED: Patient will: 1.  Reduce symptoms of: anxiety, depression and stress  2.  Increase knowledge and/or ability of: healthy habits and self-management skills  3.   Demonstrate ability to: Increase healthy adjustment to current life circumstances   Progress of Goals: Revised  INTERVENTIONS: Interventions utilized:  Mindfulness or Psychologist, educational and Psychoeducation and/or Health Education Standardized Assessments completed & reviewed: GAD-7 and PHQ 9   OUTCOME: Patient Response: Pt using deep breathing and time outdoors to herself to cope; practiced additional breathing exercise that she agrees to add to daily routine   PLAN: 1. Follow up with behavioral health clinician on : Two weeks 2. Behavioral recommendations:  -Pick up Zofran today to take as prescribed for nausea -Take prenatal vitamin daily (may help reduce food cravings for any vitamins body is low in) now that nausea is decreasing -Begin CALM relaxation breathing exercise twice daily(morning; at bedtime), and at beginning of any headaches, for the next two weeks -Continue to use deep breathing and outdoor time as needed throughout the day 3. Referral(s): [place] (In Clinic)  I discussed the assessment and treatment plan with the patient and/or parent/guardian. They were provided an opportunity to ask questions and all were answered. They agreed with the plan and demonstrated an understanding of the instructions.   They were advised to call back or seek an in-person evaluation as appropriate.  I discussed that the purpose of this visit is to provide behavioral health care while limiting exposure to the novel coronavirus.  Discussed there is a possibility of technology failure and discussed alternative modes of communication if that failure occurs.  Miranda George Miranda George  Depression screen [facility] 2/9 12/21/2019 11/25/2019 11/19/2019  Decreased Interest 1 0 2  Down, Depressed, Hopeless 1 2 2   PHQ - 2 Score 2 2 4   Altered sleeping 0 1 0  Tired, decreased energy 1 1 3   Change in appetite 3 0 0  Feeling bad or failure about yourself  0 0 0  Trouble  concentrating 0 0 0  Moving slowly or fidgety/restless 0 0 0  Suicidal thoughts 0 0 0  PHQ-9 Score 6 4 7    GAD 7 : Generalized Anxiety Score 12/21/2019 11/25/2019 11/19/2019  Nervous, Anxious, on Edge 0 0 0  Control/stop worrying 1 1 3   Worry too much - different things 1 0 2  Trouble relaxing 0 0 0  Restless 0 0 0  Easily annoyed or irritable 3 3 3   Afraid - awful might happen 0 2 3  Total GAD 7 Score 5 6 11

## 2019-12-14 ENCOUNTER — Other Ambulatory Visit: Payer: Self-pay | Admitting: Obstetrics & Gynecology

## 2019-12-14 DIAGNOSIS — O98719 Human immunodeficiency virus [HIV] disease complicating pregnancy, unspecified trimester: Secondary | ICD-10-CM

## 2019-12-14 DIAGNOSIS — O0990 Supervision of high risk pregnancy, unspecified, unspecified trimester: Secondary | ICD-10-CM

## 2019-12-14 MED ORDER — ONDANSETRON 4 MG PO TBDP: 4.0000 mg | ORAL_TABLET | Freq: Four times a day (QID) | ORAL | 0 refills | Status: DC | PRN

## 2019-12-15 ENCOUNTER — Other Ambulatory Visit: Payer: Self-pay

## 2019-12-15 ENCOUNTER — Other Ambulatory Visit: Payer: Medicaid Other

## 2019-12-15 DIAGNOSIS — B20 Human immunodeficiency virus [HIV] disease: Secondary | ICD-10-CM | POA: Diagnosis not present

## 2019-12-18 ENCOUNTER — Encounter (HOSPITAL_COMMUNITY): Payer: Self-pay | Admitting: Obstetrics & Gynecology

## 2019-12-18 ENCOUNTER — Inpatient Hospital Stay (HOSPITAL_BASED_OUTPATIENT_CLINIC_OR_DEPARTMENT_OTHER): Payer: Medicaid Other

## 2019-12-18 ENCOUNTER — Other Ambulatory Visit: Payer: Self-pay

## 2019-12-18 ENCOUNTER — Ambulatory Visit (INDEPENDENT_AMBULATORY_CARE_PROVIDER_SITE_OTHER): Payer: Medicaid Other | Admitting: Obstetrics & Gynecology

## 2019-12-18 ENCOUNTER — Inpatient Hospital Stay (HOSPITAL_COMMUNITY)
Admission: AD | Admit: 2019-12-18 | Discharge: 2019-12-18 | Disposition: A | Discharge status: Home / Self Care | Payer: Medicaid Other | Attending: Obstetrics and Gynecology | Admitting: Obstetrics and Gynecology

## 2019-12-18 VITALS — BP 116/78 | HR 80 | Wt 197.0 lb

## 2019-12-18 DIAGNOSIS — O98712 Human immunodeficiency virus [HIV] disease complicating pregnancy, second trimester: Secondary | ICD-10-CM | POA: Diagnosis not present

## 2019-12-18 DIAGNOSIS — R1030 Lower abdominal pain, unspecified: Secondary | ICD-10-CM | POA: Insufficient documentation

## 2019-12-18 DIAGNOSIS — O418X20 Other specified disorders of amniotic fluid and membranes, second trimester, not applicable or unspecified: Secondary | ICD-10-CM

## 2019-12-18 DIAGNOSIS — O26892 Other specified pregnancy related conditions, second trimester: Secondary | ICD-10-CM | POA: Insufficient documentation

## 2019-12-18 DIAGNOSIS — O4692 Antepartum hemorrhage, unspecified, second trimester: Secondary | ICD-10-CM | POA: Diagnosis not present

## 2019-12-18 DIAGNOSIS — Z79899 Other long term (current) drug therapy: Secondary | ICD-10-CM | POA: Diagnosis not present

## 2019-12-18 DIAGNOSIS — B20 Human immunodeficiency virus [HIV] disease: Secondary | ICD-10-CM | POA: Insufficient documentation

## 2019-12-18 DIAGNOSIS — O09522 Supervision of elderly multigravida, second trimester: Secondary | ICD-10-CM | POA: Diagnosis not present

## 2019-12-18 DIAGNOSIS — O34219 Maternal care for unspecified type scar from previous cesarean delivery: Secondary | ICD-10-CM

## 2019-12-18 DIAGNOSIS — Z3A20 20 weeks gestation of pregnancy: Secondary | ICD-10-CM | POA: Diagnosis not present

## 2019-12-18 DIAGNOSIS — Z789 Other specified health status: Secondary | ICD-10-CM

## 2019-12-18 DIAGNOSIS — O0990 Supervision of high risk pregnancy, unspecified, unspecified trimester: Secondary | ICD-10-CM

## 2019-12-18 DIAGNOSIS — O98719 Human immunodeficiency virus [HIV] disease complicating pregnancy, unspecified trimester: Secondary | ICD-10-CM

## 2019-12-18 DIAGNOSIS — F4321 Adjustment disorder with depressed mood: Secondary | ICD-10-CM

## 2019-12-18 DIAGNOSIS — O26899 Other specified pregnancy related conditions, unspecified trimester: Secondary | ICD-10-CM

## 2019-12-18 DIAGNOSIS — R109 Unspecified abdominal pain: Secondary | ICD-10-CM

## 2019-12-18 DIAGNOSIS — Z98891 History of uterine scar from previous surgery: Secondary | ICD-10-CM

## 2019-12-18 HISTORY — DX: Asymptomatic human immunodeficiency virus (hiv) infection status: Z21

## 2019-12-18 HISTORY — DX: Human immunodeficiency virus (HIV) disease: B20

## 2019-12-18 LAB — URINALYSIS, ROUTINE W REFLEX MICROSCOPIC
Bilirubin Urine: NEGATIVE
Glucose, UA: 50 mg/dL — AB
Ketones, ur: NEGATIVE mg/dL
Nitrite: NEGATIVE
Protein, ur: NEGATIVE mg/dL
Specific Gravity, Urine: 1.02 (ref 1.005–1.030)
pH: 6 (ref 5.0–8.0)

## 2019-12-18 LAB — I-STAT BETA HCG BLOOD, ED (MC, WL, AP ONLY): I-stat hCG, quantitative: >2000 m[IU]/mL — ABNORMAL HIGH (ref <5)

## 2019-12-18 LAB — WET PREP, GENITAL
Sperm: NONE SEEN
Trich, Wet Prep: NONE SEEN
Yeast Wet Prep HPF POC: NONE SEEN

## 2019-12-18 LAB — HIV-1 RNA QUANT-NO REFLEX-BLD
HIV 1 RNA Quant: 36 Copies/mL — ABNORMAL HIGH
HIV-1 RNA Quant, Log: 1.56 Log cps/mL — ABNORMAL HIGH

## 2019-12-18 NOTE — Discharge Instructions (Signed)
Hemorragia vaginal durante el segundo trimestre de embarazo Vaginal Bleeding During Pregnancy, Second Trimester  Durante el Media planner, es comn tener una pequea hemorragia vaginal [place]). A veces, la hemorragia es normal y no es Therapist, nutritional. En algunos otros casos, es un signo de algo grave. Informe a su mdico de inmediato si tiene algn tipo de hemorragia vaginal. Siga estas instrucciones en su casa: Actividad Siga las instrucciones de su mdico con respecto al grado de actividad que puede Optometrist. De ser necesario, organcese para que alguien la ayude con las actividades habituales. No haga actividad fsica ni ninguna actividad que exija mucho esfuerzo hasta que el mdico le diga que es seguro. No levante ningn objeto que pese ms de 10libras (4.5kg) hasta que el mdico le diga que es seguro. No tenga relaciones sexuales ni orgasmos hasta que el mdico le diga que es seguro. Medicamentos Use los medicamentos de venta libre y los recetados solamente como se lo haya indicado el mdico. No tome aspirina. Puede ocasionar hemorragias. Instrucciones generales Controle su afeccin para detectar cualquier cambio. [place] los siguientes datos: La cantidad de toallas higinicas que [place] cada da. La frecuencia con la que se cambia las toallas higinicas. Qu tan empapadas estn. No use tampones. No se haga duchas vaginales. Si elimina tejido por la vagina, gurdelo para mostrrselo al [employer]. Concurra a todas visitas de seguimiento como se lo haya indicado el mdico. Esto es importante. Comunquese con un mdico si: Tiene una hemorragia vaginal en cualquier momento durante el embarazo. Tiene clicos. Jaclynn Guarneri, y esta no se [place] con medicamentos. Solicite ayuda inmediatamente si: Siente clicos muy intensos en la espalda o en el vientre (abdomen). Siente contracciones. Tiene escalofros. Elimina cogulos grandes o mucho tejido por la vagina. La hemorragia  empeora. Siente que va a desvanecerse. Se siente dbil. Pierde el conocimiento (se desmaya). Tiene una prdida de lquido por la vagina. Tiene una prdida de lquido abundante por la vagina. Resumen A veces, la hemorragia durante el embarazo es normal y no representa un problema. A veces, puede ser una seal de algo grave. Informe a su mdico de inmediato si tiene algn tipo de hemorragia vaginal. Siga las instrucciones de su mdico con respecto al grado de actividad que puede Optometrist. Quiz necesite que alguien la ayude con las actividades habituales. Esta informacin no tiene Marine scientist el consejo del mdico. Asegrese de hacerle al mdico cualquier pregunta que tenga. Document Revised: 04/22/2018 Document Reviewed: 04/22/2018 Elsevier Patient Education  [place]. Hematoma subcorinico Subchorionic Hematoma  Un hematoma subcorinico es una acumulacin de sangre entre la pared externa del embrin (corion) y la pared interna de la matriz (tero). Esta afeccin puede causar hemorragia vaginal. Si causan poca o nada de hemorragia vaginal, generalmente, los hematomas pequeos que ocurren al principio del Media planner se reducen por su propia cuenta y no afectan al beb ni al [employer]. Cuando la hemorragia comienza ms tarde en el embarazo, o el hematoma es ms grande o se produce en una paciente de edad avanzada, la afeccin puede ser ms grave. Los hematomas ms grandes pueden agrandarse an ms, lo que [employer] de aborto espontneo. Esta afeccin tambin [employer] siguientes riesgos:  Separacin prematura de la placenta del tero.  Parto antes de trmino Public affairs consultant).  Muerte fetal. Cules son las causas? Se desconoce la causa exacta de esta afeccin. Ocurre cuando la sangre queda atrapada entre la placenta y la pared uterina porque la placenta se ha separado del Environmental consultant original  del implante. Qu incrementa el riesgo? Es ms probable que desarrolle esta  afeccin si:  Recibi tratamiento con medicamentos para la fertilidad.  La concepcin se realiz a travs de la fertilizacin in vitro (FIV). Cules son los signos o los sntomas? Los sntomas de esta afeccin [employer] siguientes:  Prdida o hemorragia vaginal.  Contracciones del tero. Estas contracciones provocan dolor abdominal. En ocasiones, puede no haber sntomas y Engineer, technical sales solo se puede ver cuando se toman imgenes ecogrficas (ecografa transvaginal). Cmo se diagnostica? Esta afeccin se diagnostica con un examen fsico. Es un examen plvico. Tambin pueden hacerle otros estudios, por ejemplo:  Anlisis de [place].  Anlisis de [place].  Ecografa del abdomen. Cmo se trata? El tratamiento de esta afeccin puede variar. El tratamiento puede incluir lo siguiente:  Observacin cautelosa. La observarn atentamente para detectar cualquier cambio en la hemorragia. Durante esta etapa: ? El hematoma puede reabsorberse en el cuerpo. ? El hematoma puede separar el espacio lleno de lquido que contiene al embrin (saco gestacional) de la pared del tero (endometrio).  Medicamentos.  Restriccin de [employer]. Puede ser necesaria hasta que se detenga la hemorragia. Siga estas indicaciones en su casa:  Haga reposo en cama si se lo indica el mdico.  No levante ningn objeto que pese ms de 10libras (4,5kg) o siga las indicaciones del mdico.  No consuma ningn producto que contenga nicotina o tabaco, como cigarrillos y Psychologist, sport and exercise. Si necesita ayuda para dejar de fumar, consulte al mdico.  Lleve un registro escrito de la cantidad de toallas higinicas que utiliza cada da y cun empapadas (saturadas) estn.  No use tampones.  Concurra a todas las visitas de control como se lo haya indicado el mdico. Esto es importante. El profesional podr pedirle que se realice anlisis de seguimiento, ecografas o [place]. Comunquese con un mdico si:  Tiene  una hemorragia vaginal.  Tiene fiebre. Solicite ayuda de inmediato si:  Siente calambres intensos en el estmago, en la espalda, en el abdomen o en la pelvis.  Elimina cogulos o tejidos grandes. Guarde los tejidos para que su mdico los vea.  Tiene ms hemorragia vaginal, y se desmaya o se siente mareada o dbil. Resumen  Un hematoma subcorinico es una acumulacin de sangre entre la pared externa de la placenta y [place].  Esta afeccin puede causar hemorragia vaginal.  En ocasiones, puede no haber sntomas y Engineer, technical sales solo se puede ver cuando se toman imgenes ecogrficas.  El tratamiento puede incluir una observacin cautelosa, medicamentos o restriccin de [place]. Esta informacin no tiene Marine scientist el consejo del mdico. Asegrese de hacerle al mdico cualquier pregunta que tenga. Document Revised: 10/11/2016 Document Reviewed: 10/11/2016 Elsevier Patient Education  2020 [employer].

## 2019-12-18 NOTE — MAU Provider Note (Signed)
  History    CSN: [id number]  Arrival date and time: 12/18/19 Miranda George is a 39 y.o. G2P1001 at [redacted]w[redacted]d with reports of vaginal bleeding on her underwear  that started this afternoon after her doctor's appointment. She reports that she is not wearing a pad and bleeding is only when she wipes now. Endorses some mild lower abdominal pain that is sharp and constant in nature. She has not tried anything to help with pain. She denies any recent intercourse or pelvic exams.    OB History    Gravida  2   Para  1   Term  1   Preterm      AB      Living  1     SAB      TAB      Ectopic      Multiple      Live Births  1           Past Medical History:  Diagnosis Date  . Anemia   . HIV (human immunodeficiency virus infection) ([place])     Past Surgical History:  Procedure Laterality Date  . APPENDECTOMY    . CESAREAN SECTION      No family history on file.  Social History   Tobacco Use  . Smoking status: Never Smoker  . Smokeless tobacco: Never Used  Substance Use Topics  . Alcohol use: Never  . Drug use: Never    Allergies: No Known Allergies  Medications Prior to Admission  Medication Sig Dispense Refill Last Dose  . emtricitabine-tenofovir (TRUVADA) 200-300 MG tablet Take 1 tablet by mouth daily.     . Prenatal Vit-Fe Fumarate-FA (MULTIVITAMIN-PRENATAL) 27-0.8 MG TABS tablet Take 1 tablet by mouth daily at 12 noon.   12/18/2019 at Unknown time    Review of Systems  Constitutional: Negative.   HENT: Negative.   Respiratory: Negative.   Cardiovascular: Negative.   Gastrointestinal: Positive for abdominal pain.  Genitourinary: Positive for vaginal bleeding.  Neurological: Negative.   Psychiatric/Behavioral: Negative.    Physical Exam   Blood pressure 118/70, pulse 75, temperature 97.8 F (36.6 C), temperature source Oral, resp. rate 18, weight 90.3 kg, SpO2 100 %.  Physical Exam Cardiovascular:     Rate and Rhythm: Normal rate.   Pulmonary:     Effort: Pulmonary effort is normal.  Abdominal:     General: Abdomen is flat.     Palpations: Abdomen is soft.     Tenderness: There is no abdominal tenderness.  Genitourinary:    Comments: Small amount of blood in vagina, cervix pink, smooth, appears visually closed Skin:    General: Skin is warm and dry.  Neurological:     General: No focal deficit present.     Mental Status: She is alert and oriented to person, place, and time.  Psychiatric:        Mood and Affect: Mood normal.        Behavior: Behavior normal.   FHR: 147 bpm  MAU Course  Procedures  MDM UA Wet prep GC/CT Limited OB U/S  Assessment and Plan  1. Abdominal pain during pregnancy, antepartum  2. Subchorionic hematoma in second trimester, single or unspecified fetus - Discharge patient - Bleeding precautions reviewed - Pelvic rest - Warning signs reviewed with patient - Follow up as scheduled for OB appointment - Spanish interpretor used for entire visit   Renee Harder, CNM 12/18/2019, 8:11 PM

## 2019-12-18 NOTE — Patient Instructions (Signed)
.  cwho

## 2019-12-18 NOTE — MAU Note (Signed)
Pt sent up from ER.  Is pregnant, having some bleeding(started today.was on her undergarments, now only when she wipes) and some cramping. Had a check up at [facility] this morning, everything was ok. (has d/c paperwork)

## 2019-12-18 NOTE — Progress Notes (Signed)
Pt states that she's having problems sleeping due to a lot of congestion.

## 2019-12-18 NOTE — ED Triage Notes (Signed)
Emergency Medicine Provider OB Triage Evaluation Note  Miranda George is a 39 y.o. female, No obstetric history on file., at Unknown gestation who presents to the emergency department with complaints of vaginal bleeding in pregnancy onset today when using the bathroom- wiping. Not using a pad. Reports abdominal pain in left lower abdomen and vagina, described as a dull pain that is constant. States [redacted] weeks pregnant G3P1, 1 miscarriage at 3 months  [facility] in [place]. LMP June or July, due date April 21, 22 (maybe).  Review of  Systems  Positive: vaginal bleeding, abdominal pain Negative: heavy bleeding  Physical Exam  BP (!) 121/51   Pulse 84   Temp 98.4 F (36.9 C)   Resp 16   SpO2 98%  General: Awake, no distress  HEENT: Atraumatic  Resp: Normal effort  Cardiac: Normal rate Abd: Nondistended, nontender  MSK: Moves all extremities without difficulty Neuro: Speech clear  Medical Decision Making  Pt evaluated for pregnancy concern and is stable for transfer to MAU. Pt is in agreement with plan for transfer.  3:48 PM Discussed with MAU APP, Threasa Beards, who accepts patient in transfer.  Clinical Impression  No diagnosis found.     Tacy Learn, PA-C 12/18/19 1556

## 2019-12-18 NOTE — Progress Notes (Addendum)
   PRENATAL VISIT NOTE  Subjective:  Miranda George is a 39 y.o. G3P1011 at [redacted]w[redacted]d for follow up of new HIV diagnosis in this pregnancy.  She is accompanied by her significant other today and he has been tested as well.  She is being followed by ID and has appt next week.  As well, has seen by Vesta Mixer and has virtual visit next week.  She is currently monitored for the following issues for this high-risk pregnancy and has Supervision of high risk pregnancy, antepartum; AMA (advanced maternal age) multigravida 105+; Language barrier; HIV disease affecting pregnancy; and History of C-section on their problem list.  Patient reports nasal congestion but no cough or SOB, fever.  Contractions: Not present. Vag. Bleeding: None.  Movement: Present. Denies leaking of fluid.   The following portions of the patient's history were reviewed and updated as appropriate: allergies, current medications, past family history, past medical history, past social history, past surgical history and problem list.   Objective:   Vitals:   12/18/19 0846  BP: 116/78  Pulse: 80  Weight: 197 lb (89.4 kg)    Fetal Status: Fetal Heart Rate (bpm): 153   Movement: Present     General:  Alert, oriented and cooperative. Patient is in no acute distress.  Skin: Skin is warm and dry. No rash noted.   Cardiovascular: Normal heart rate noted  Respiratory: Normal respiratory effort, no problems with respiration noted  Abdomen: Soft, gravid, appropriate for gestational age.  Pain/Pressure: Present     Pelvic: Cervical exam deferred        Extremities: Normal range of motion.  Edema: None  Mental Status: Normal mood and affect. Normal behavior. Normal judgment and thought content.   Assessment and Plan:  Pregnancy: G3P1011 at [redacted]w[redacted]d 1. Supervision of high risk pregnancy, antepartum - AFP ordered today - [place] MFM OB DETAIL +14 WK; Future  2. HIV infection in mother during pregnancy, antepartum - follow up with ID  12/8 -She is taking medications -She is aware no breast feeding is recommended  3. History of C-section -Discussed c section for this pregnancy depending on viral load  4. Adjustment disorder with depressed mood -[facility] follow up scheduled for 12/21/2019  5.  Language barrier Production assistant, radio used today  Preterm labor symptoms and general obstetric precautions including but not limited to vaginal bleeding, contractions, leaking of fluid and fetal movement were reviewed in detail with the patient. Please refer to After Visit Summary for other counseling recommendations.   Return in about 4 weeks (around 01/15/2020) for Office Ob visit (MD only).   Future Appointments  Date Time Provider [place]  12/21/2019  9:45 AM [facility]  12/23/2019  9:00 AM Jabier Mutton, MD [facility]  01/01/2020  8:15 AM [facility] NURSE [facility]  01/01/2020  8:30 AM [facility] US3 [facility]    Megan Salon, MD

## 2019-12-20 LAB — AFP, SERUM, OPEN SPINA BIFIDA
AFP MoM: 1.23
AFP Value: 40.1 ng/mL
Gest. Age on Collection Date: 17 weeks
Maternal Age At EDD: 38.1 yr
OSBR Risk 1 IN: 5926
Test Results:: NEGATIVE
Weight: 197 [lb_av]

## 2019-12-21 ENCOUNTER — Ambulatory Visit (INDEPENDENT_AMBULATORY_CARE_PROVIDER_SITE_OTHER): Payer: Medicaid Other | Admitting: Clinical

## 2019-12-21 ENCOUNTER — Telehealth: Payer: Self-pay

## 2019-12-21 DIAGNOSIS — F4321 Adjustment disorder with depressed mood: Secondary | ICD-10-CM

## 2019-12-21 DIAGNOSIS — F4323 Adjustment disorder with mixed anxiety and depressed mood: Secondary | ICD-10-CM

## 2019-12-21 LAB — GC/CHLAMYDIA PROBE AMP (~~LOC~~) NOT AT ARMC
Chlamydia: NEGATIVE
Comment: NEGATIVE
Comment: NORMAL
Neisseria Gonorrhea: NEGATIVE

## 2019-12-21 NOTE — Telephone Encounter (Addendum)
-----   Message from Megan Salon, MD sent at 12/21/2019  8:17 AM EST ----- Please let pt know her AFP was normal.  Thanks.  Edwinna Areola, MD   Called pt with interpreter Raquel. Explained results are normal. Pt reports going to MAU over the weekend, cannot find this encounter in chart. Pt has 2 medical records; front office notified. Pt denies any vaginal bleeding or pain at this time. Encouraged pt to follow up with office as needed.

## 2019-12-22 ENCOUNTER — Encounter: Payer: Self-pay | Admitting: Obstetrics & Gynecology

## 2019-12-22 NOTE — BH Specialist Note (Addendum)
Integrated Behavioral Health via Telemedicine Visit  12/22/2019 Miranda George [id number]  Number of [place] visits: 3 Session Start time: 11:04  Session End time: 11:27 Total time: 23  Referring Provider: Emeterio Reeve, MD Patient/Family location: Home [facility] Provider location: [facility]  All persons participating in visit: Patient Miranda George and Miranda George   Types of Service: Individual psychotherapy  I connected with Miranda George [id number] [place] and/or Miranda George's n/a by Video enabled telemedicine application [employer] and verified that I am speaking with the correct person using two identifiers.    Discussed confidentiality: at previous visit  I discussed the limitations of telemedicine and the availability of in person appointments.  Discussed there is a possibility of technology failure and discussed alternative modes of communication if that failure occurs.  I discussed that engaging in this telemedicine visit, they consent to the provision of behavioral healthcare and the services will be billed under their insurance.  Patient and/or legal guardian expressed understanding and consented to Telemedicine visit: Yes   Presenting Concerns: Patient and/or family reports the following symptoms/concerns: Pt states her nausea is resolving, is using self-coping strategies (positive self-talk ,deep breathing, talking to sisters) to manage emotions and has decided to accept her diagnosis, with some elevated stress at home.  Duration of problem: Current pregnancy after new diagnosis; Severity of problem: mild  Patient and/or Family's Strengths/Protective Factors: Supportive family  Goals Addressed: Patient will: 1.  Maintain reduction of symptoms of: anxiety, depression and stress  2.  Demonstrate ability to: Increase healthy adjustment to  current life circumstances  Progress towards Goals: Ongoing  Interventions: Interventions utilized:  Supportive Counseling Standardized Assessments completed: GAD-7 and PHQ 9  Patient and/or Family Response: Pt agrees to revised treatment plan  Assessment: Patient currently experiencing Psychosocial stress.   Patient may benefit from continued psychoeducation and brief therapeutic interventions regarding maintaining reduction of  Anxiety, depression and life stress .  Plan: 1. Follow up with behavioral health clinician on : As needed 2. Behavioral recommendations:  -Continue taking prenatal vitamins as recommended by medical provider -Continue using self-coping strategies to manage emotions during remainder of pregnancy (positive self-talk, deep breathing, talking to sisters, focus on healthy sleeping and eating) 3. Referral(s): [place] (In Clinic)  I discussed the assessment and treatment plan with the patient and/or parent/guardian. They were provided an opportunity to ask questions and all were answered. They agreed with the plan and demonstrated an understanding of the instructions.   They were advised to call back or seek an in-person evaluation if the symptoms worsen or if the condition fails to improve as anticipated.  Miranda Fair, LCSW   Depression screen [facility] 2/9 01/04/2020 12/23/2019 12/21/2019 11/25/2019 11/19/2019  Decreased Interest 0 1 1 0 2  Down, Depressed, Hopeless 0 0 1 2 2   PHQ - 2 Score 0 1 2 2 4   Altered sleeping 2 2 0 1 0  Tired, decreased energy 1 2 1 1 3   Change in appetite 0 0 3 0 0  Feeling bad or failure about yourself  0 0 0 0 0  Trouble concentrating 0 0 0 0 0  Moving slowly or fidgety/restless 0 0 0 0 0  Suicidal thoughts 0 0 0 0 0  PHQ-9 Score 3 5 6 4 7    GAD 7 : Generalized Anxiety Score 01/04/2020 12/21/2019 11/25/2019 11/19/2019  Nervous, Anxious, on Edge 0  0 0 0  Control/stop worrying 1 1 1 3   Worry too much  - different things 1 1 0 2  Trouble relaxing 0 0 0 0  Restless 1 0 0 0  Easily annoyed or irritable 1 3 3 3   Afraid - awful might happen 1 0 2 3  Total GAD 7 Score 5 5 6  11

## 2019-12-23 ENCOUNTER — Encounter: Payer: Self-pay | Admitting: Internal Medicine

## 2019-12-23 ENCOUNTER — Ambulatory Visit: Payer: Medicaid Other | Admitting: Internal Medicine

## 2019-12-23 ENCOUNTER — Other Ambulatory Visit: Payer: Self-pay

## 2019-12-23 VITALS — BP 104/67 | HR 81 | Resp 16 | Ht <= 58 in | Wt 200.1 lb

## 2019-12-23 DIAGNOSIS — Z23 Encounter for immunization: Secondary | ICD-10-CM | POA: Diagnosis not present

## 2019-12-23 DIAGNOSIS — O98719 Human immunodeficiency virus [HIV] disease complicating pregnancy, unspecified trimester: Secondary | ICD-10-CM

## 2019-12-23 NOTE — Addendum Note (Signed)
Addended by: Theresia Majors A on: 12/23/2019 05:23 PM   Modules accepted: Orders

## 2019-12-23 NOTE — Patient Instructions (Addendum)
Thank you for seeing [place] today  You are doing very well with your HIV Will continue the tivicay and truvada medications. Please do not miss any dose   Will also vaccinate you for hepatitis B (heplisav) and pneumonia (prevnar) today  Other vaccination you will need include meningitis which we can do in the near future  Please see [place] again in 8 weeks with preclinic labs  Please continue to have routine prenatal care with your obstetrician  Please have your husband retested

## 2019-12-23 NOTE — Progress Notes (Signed)
[place] for Infectious Disease  Patient Active Problem List   Diagnosis Date Noted  . Supervision of high risk pregnancy, antepartum 11/13/2019  . AMA (advanced maternal age) multigravida 35+ 11/13/2019  . Language barrier 11/13/2019  . HIV disease affecting pregnancy 11/13/2019  . History of C-section 11/13/2019      Subjective:    Patient ID: Miranda George, female    DOB: 1980/11/19, 39 y.o.   MRN: [id number]  Chief Complaint  Patient presents with  . Follow-up    b20 new dx    HPI:  Miranda George is a 39 y.o. female recently dx'ed hiv, G3p1011, currently [redacted] week pregnant, referred here for new HIV ART start  She was seen in the ED on 12/03 for abd pain and vaginal bleeding, briefly admitted and dx'ed with subchorionic hematoma. Gc/chlamydia, wet mount negative. The vaginal bleeding had stopped yesterday, and the abdominal pain had also gone  #HIV Dx'ed 10/2019; heterosexual; denies ivdu/promiscuity; patient is married Patient had some blood transfusion during her 2nd pregnancy Husband tested and told her he was negative 2 months ago although there is some question as he tried to call back to the clinic and haven't heard Patient married 1 year; together for 4 years She was seen in rcid clinic on 10/28 with our pharmacist Cassie and started on tivicay and truvada. She had some nausea at the beginning but no longer. She doesn't miss any dose. 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, v179I/V, R211K PR mutation -- I62v, L63p, v77I/V, I93L There were no phenotypic resistance to NRTI, nnrti, or PI Baseline HIV labs: [id number] negative 11/12/2019 hepatitis b sAb, sAg, cAb; hep cAb negative; hep A Ab positive 11/12/2019 quant gold negative  She has fatigue since pregnancy and that hasn't changed She also endorsed some mild depression but no hi/si (because of positive hiv)  No fever, chill,  nightsweat No headache, visual change, blurriness No cough, chest pain, sob No sore throat No diarrhea No urinary urgency, dysuria, flank pain No bruising No joint pain, myalgia, muscle weakness No focal weakness No rash   No Known Allergies    Outpatient Medications Prior to Visit  Medication Sig Dispense Refill  . acetaminophen (TYLENOL) 500 MG tablet Take 500-1,000 mg by mouth every 6 (six) hours as needed for mild pain. (Patient not taking: Reported on 11/19/2019)    . dolutegravir (TIVICAY) 50 MG tablet Take 1 tablet (50 mg total) by mouth daily. 30 tablet 5  . dolutegravir (TIVICAY) 50 MG tablet Take 50 mg by mouth daily.    Marland Kitchen emtricitabine-tenofovir (TRUVADA) 200-300 MG tablet Take 1 tablet by mouth daily. 30 tablet 5  . emtricitabine-tenofovir (TRUVADA) 200-300 MG tablet Take 1 tablet by mouth daily.    . ondansetron (ZOFRAN ODT) 4 MG disintegrating tablet Take 1 tablet (4 mg total) by mouth every 6 (six) hours as needed for nausea. (Patient not taking: Reported on 12/18/2019) 20 tablet 0  . Prenatal Vit-Fe Fumarate-FA (MULTIVITAMIN-PRENATAL) 27-0.8 MG TABS tablet Take 1 tablet by mouth daily at 12 noon.    . Prenatal Vit-Fe Fumarate-FA (PRENATAL VITAMIN PO) Take 1 tablet by mouth daily.     No facility-administered medications prior to visit.     Past Medical History:  Diagnosis Date  . Anemia   . Blood transfusion without reported diagnosis   . Fibroid   . GERD (gastroesophageal reflux disease)   .  HIV (human immunodeficiency virus infection) ([place])       Past Surgical History:  Procedure Laterality Date  . APPENDECTOMY     18 years ago  . APPENDECTOMY    . CESAREAN SECTION  2012  . CESAREAN SECTION        Family History  Problem Relation Age of Onset  . Hypertension Mother   . Diabetes Mother   . Thyroid disease Mother   . Hypertension Father   . Diabetes Father       Social History   Socioeconomic History  . Marital status: Married     Spouse name: Not on file  . Number of children: Not on file  . Years of education: Not on file  . Highest education level: Not on file  Occupational History  . Not on file  Tobacco Use  . Smoking status: Never Smoker  . Smokeless tobacco: Never Used  Vaping Use  . Vaping Use: Never used  Substance and Sexual Activity  . Alcohol use: Never    Comment: occasionally  . Drug use: Never  . Sexual activity: Yes    Birth control/protection: None  Other Topics Concern  . Not on file  Social History Narrative   ** Merged History Encounter **       Social Determinants of Health   Financial Resource Strain:   . Difficulty of Paying Living Expenses: Not on file  Food Insecurity: Food Insecurity Present  . Worried About Charity fundraiser in the Last Year: Sometimes true  . Ran Out of Food in the Last Year: Sometimes true  Transportation Needs: No Transportation Needs  . Lack of Transportation (Medical): No  . Lack of Transportation (Non-Medical): No  Physical Activity:   . Days of Exercise per Week: Not on file  . Minutes of Exercise per Session: Not on file  Stress:   . Feeling of Stress : Not on file  Social Connections:   . Frequency of Communication with Friends and Family: Not on file  . Frequency of Social Gatherings with Friends and Family: Not on file  . Attends Religious Services: Not on file  . Active Member of Clubs or Organizations: Not on file  . Attends Archivist Meetings: Not on file  . Marital Status: Not on file  Intimate Partner Violence:   . Fear of Current or Ex-Partner: Not on file  . Emotionally Abused: Not on file  . Physically Abused: Not on file  . Sexually Abused: Not on file           Objective:    LMP 04/30/2019  Nursing note and vital signs reviewed.  Physical Exam Gravid, obese, no distress, pleasant HEENT: normocephalic, per, eomi, conj clear Neck supple Lymph no cervical, axillary, inguinal adenopathy Skin no rash cv  rrr no mrg Lungs clear, normal respiratory effort abd s/nt; gravid Ext no edema Neuro cn2-12 function intact, strength/reflex symmetric, gait normal Psych alert/oriented  msk no cva tenderness      Labs: Reviewed  Hiv                cd4 (%)      /    hiv vl 12/28               340 (32)    /     32k  Serology: 12/18/19 urine gc/chlam and wetmount negative 10/28 quant gold negative; hep b/c negative; hep a reactive; rpr nonreactive 10/28 hla [id number] negative 10/28 hiv  genotype no phenotypic resistance    Imaging: None   Assessment & Plan:   Patient Active Problem List   Diagnosis Date Noted  . Supervision of high risk pregnancy, antepartum 11/13/2019  . AMA (advanced maternal age) multigravida 35+ 11/13/2019  . Language barrier 11/13/2019  . HIV disease affecting pregnancy 11/13/2019  . History of C-section 11/13/2019     Problem List Items Addressed This Visit      Other   HIV disease affecting pregnancy - Primary   Relevant Orders   CBC w/Diff   Comp Met (CMET)   HIV 1 RNA quant-no reflex-bld     #hiv dx'ed during pregnancy. Suspect transmission from husband. No other risk factors As of this visit she is not sure of her husband's hiv status. I have advised her to have an open discussion with her husband to get retested. 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 No evidence IRIS. Baseline cd4 prior to med 340 (32%)  -continue tivicay/truvada -discussed u=u -encourage her husband to f/u with testing result and get retested as necessary -repeat hiv labs in 8 weeks  #gravid #vaginal bleed  Subchorionic hematoma dx'ed. Improving -f/u obstetrician  #hcm -vaccination Will start prevnar today 10/28 and pneumovax in 8 weeks Will review meningococcal vaccine and tdap -hepatitis Start heplisav series today 10/28 -annual 10/2019  quant gold negative; rpr negative -woman's health Patient will continue to have routine care with her obstetrician    I have discontinued Lyan Rosendo Gros Santos's acetaminophen, Prenatal Vit-Fe Fumarate-FA (PRENATAL VITAMIN PO), and ondansetron. I am also having her maintain her Tivicay, emtricitabine-tenofovir, and multivitamin-prenatal.   No orders of the defined types were placed in this encounter.    Follow-up: Return in about 8 weeks (around 02/17/2020).      Jabier Mutton, [place] for Infectious Disease [place] -- -- pager   [phone] cell 12/23/2019, 8:45 AM

## 2020-01-01 ENCOUNTER — Ambulatory Visit: Payer: Medicaid Other | Attending: Obstetrics & Gynecology

## 2020-01-01 ENCOUNTER — Ambulatory Visit: Payer: Medicaid Other | Admitting: *Deleted

## 2020-01-01 ENCOUNTER — Other Ambulatory Visit: Payer: Self-pay

## 2020-01-01 ENCOUNTER — Other Ambulatory Visit: Payer: Self-pay | Admitting: *Deleted

## 2020-01-01 ENCOUNTER — Encounter: Payer: Self-pay | Admitting: *Deleted

## 2020-01-01 DIAGNOSIS — Z789 Other specified health status: Secondary | ICD-10-CM

## 2020-01-01 DIAGNOSIS — O09522 Supervision of elderly multigravida, second trimester: Secondary | ICD-10-CM

## 2020-01-01 DIAGNOSIS — Z98891 History of uterine scar from previous surgery: Secondary | ICD-10-CM

## 2020-01-01 DIAGNOSIS — B20 Human immunodeficiency virus [HIV] disease: Secondary | ICD-10-CM

## 2020-01-01 DIAGNOSIS — O0990 Supervision of high risk pregnancy, unspecified, unspecified trimester: Secondary | ICD-10-CM | POA: Insufficient documentation

## 2020-01-01 DIAGNOSIS — Z3A19 19 weeks gestation of pregnancy: Secondary | ICD-10-CM

## 2020-01-01 DIAGNOSIS — O99212 Obesity complicating pregnancy, second trimester: Secondary | ICD-10-CM

## 2020-01-01 DIAGNOSIS — Z363 Encounter for antenatal screening for malformations: Secondary | ICD-10-CM | POA: Diagnosis not present

## 2020-01-01 DIAGNOSIS — E669 Obesity, unspecified: Secondary | ICD-10-CM

## 2020-01-01 DIAGNOSIS — O0992 Supervision of high risk pregnancy, unspecified, second trimester: Secondary | ICD-10-CM | POA: Diagnosis not present

## 2020-01-01 DIAGNOSIS — O98712 Human immunodeficiency virus [HIV] disease complicating pregnancy, second trimester: Secondary | ICD-10-CM

## 2020-01-04 ENCOUNTER — Ambulatory Visit (INDEPENDENT_AMBULATORY_CARE_PROVIDER_SITE_OTHER): Payer: Medicaid Other | Admitting: Clinical

## 2020-01-04 DIAGNOSIS — Z658 Other specified problems related to psychosocial circumstances: Secondary | ICD-10-CM

## 2020-01-04 DIAGNOSIS — F438 Other reactions to severe stress: Secondary | ICD-10-CM | POA: Diagnosis not present

## 2020-01-18 ENCOUNTER — Other Ambulatory Visit: Payer: Self-pay

## 2020-01-18 ENCOUNTER — Encounter: Payer: Self-pay | Admitting: Obstetrics and Gynecology

## 2020-01-18 ENCOUNTER — Ambulatory Visit (INDEPENDENT_AMBULATORY_CARE_PROVIDER_SITE_OTHER): Payer: Medicaid Other | Admitting: Obstetrics and Gynecology

## 2020-01-18 VITALS — BP 112/36 | HR 83 | Wt 200.8 lb

## 2020-01-18 DIAGNOSIS — O0990 Supervision of high risk pregnancy, unspecified, unspecified trimester: Secondary | ICD-10-CM

## 2020-01-18 DIAGNOSIS — O09522 Supervision of elderly multigravida, second trimester: Secondary | ICD-10-CM

## 2020-01-18 DIAGNOSIS — O98712 Human immunodeficiency virus [HIV] disease complicating pregnancy, second trimester: Secondary | ICD-10-CM

## 2020-01-18 DIAGNOSIS — Z789 Other specified health status: Secondary | ICD-10-CM

## 2020-01-18 DIAGNOSIS — Z98891 History of uterine scar from previous surgery: Secondary | ICD-10-CM

## 2020-01-18 NOTE — Progress Notes (Signed)
Subjective:  Miranda George is a 40 y.o. [phone] at [redacted]w[redacted]d being seen today for ongoing prenatal care.  She is currently monitored for the following issues for this high-risk pregnancy and has Supervision of high risk pregnancy, antepartum; AMA (advanced maternal age) multigravida 35+; Language barrier; HIV disease affecting pregnancy; and History of C-section on their problem list.  Patient reports no complaints.  Contractions: Not present. Vag. Bleeding: None.  Movement: Present. Denies leaking of fluid.   The following portions of the patient's history were reviewed and updated as appropriate: allergies, current medications, past family history, past medical history, past social history, past surgical history and problem list. Problem list updated.  Objective:   Vitals:   01/18/20 0841  BP: (!) 112/36  Pulse: 83  Weight: 200 lb 12.8 oz (91.1 kg)    Fetal Status: Fetal Heart Rate (bpm): 146   Movement: Present     General:  Alert, oriented and cooperative. Patient is in no acute distress.  Skin: Skin is warm and dry. No rash noted.   Cardiovascular: Normal heart rate noted  Respiratory: Normal respiratory effort, no problems with respiration noted  Abdomen: Soft, gravid, appropriate for gestational age. Pain/Pressure: Present     Pelvic:  Cervical exam deferred        Extremities: Normal range of motion.  Edema: None  Mental Status: Normal mood and affect. Normal behavior. Normal judgment and thought content.   Urinalysis:      Assessment and Plan:  Pregnancy: [id number] at [redacted]w[redacted]d  1. Supervision of high risk pregnancy, antepartum Stable Glucola next visit F/U growth scan 01/27/20  2. Multigravida of advanced maternal age in second trimester AFP negative  3. HIV disease affecting pregnancy in second trimester Followed by ID, reports taking medications  4. History of C-section Will need to discuss mode of delivery at later appts, pending viral load  5. Language  barrier Video interrupter used during today's visit  Preterm labor symptoms and general obstetric precautions including but not limited to vaginal bleeding, contractions, leaking of fluid and fetal movement were reviewed in detail with the patient. Please refer to After Visit Summary for other counseling recommendations.  Return in about 4 weeks (around 02/15/2020) for face to face, MD only, OB visit and fasting for Glucola.   Hermina Staggers, MD

## 2020-01-18 NOTE — Patient Instructions (Signed)
Segundo trimestre de embarazo Second Trimester of Pregnancy  El segundo trimestre va desde la semana14 hasta la 27 (desde el mes 4 hasta el 6). Este suele ser el momento en el que mejor se siente. En general, las nuseas matutinas han disminuido o han desaparecido completamente. Tendr ms energa y podr aumentarle el apetito. El beb en gestacin se desarrolla rpidamente. Hacia el final del sexto mes, el beb mide aproximadamente 9 pulgadas (23 cm) y pesa alrededor de 1 libras (700 g). Es probable que sienta al beb moverse entre las 18 y 20 semanas del embarazo. Siga estas indicaciones en su casa: Medicamentos  Tome los medicamentos de venta libre y los recetados solamente como se lo haya indicado el mdico. Algunos medicamentos son seguros para tomar durante el embarazo y otros no lo son.  Tome vitaminas prenatales que contengan por lo menos 600microgramos (?g) de cido flico.  Si tiene dificultad para mover el intestino (estreimiento), tome un medicamento para ablandar las heces (laxante) si su mdico se lo autoriza. Comida y bebida   Ingiera alimentos saludables de manera regular.  No coma carne cruda ni quesos sin cocinar.  Si obtiene poca cantidad de calcio de los alimentos que ingiere, consulte a su mdico sobre la posibilidad de tomar un suplemento diario de calcio.  Evite el consumo de alimentos ricos en grasas y azcares, como los alimentos fritos y los dulces.  Si tiene malestar estomacal (nuseas) o devuelve (vomita): ? Ingiera 4 o 5comidas pequeas por da en lugar de 3abundantes. ? Intente comer algunas galletitas saladas. ? Beba lquidos entre las comidas, en lugar de hacerlo durante estas.  Para evitar el estreimiento: ? Consuma alimentos ricos en fibra, como frutas y verduras frescas, cereales integrales y frijoles. ? Beba suficiente lquido para mantener el pis (orina) claro o de color amarillo plido. Actividad  Haga ejercicios solamente como se lo haya  indicado el mdico. Interrumpa la actividad fsica si comienza a tener calambres.  No haga ejercicio si hace demasiado calor, hay demasiada humedad o se encuentra en un lugar de mucha altura (altitud alta).  Evite levantar pesos excesivos.  Use zapatos con tacones bajos. Mantenga una buena postura al sentarse y pararse.  Puede continuar teniendo relaciones sexuales, a menos que el mdico le indique lo contrario. Alivio del dolor y del malestar  Use un sostn que le brinde buen soporte si sus mamas estn sensibles.  Dese baos de asiento con agua tibia para aliviar el dolor o las molestias causadas por las hemorroides. Use una crema para las hemorroides si el mdico la autoriza.  Descanse con las piernas elevadas si tiene calambres o dolor de cintura.  Si desarrolla venas hinchadas y abultadas (vrices) en las piernas: ? Use medias de compresin o medias de descanso como se lo haya indicado el mdico. ? Levante (eleve) los pies durante 15minutos, 3 o 4veces por da. ? Limite el consumo de sal en sus alimentos. Cuidado prenatal  Escriba sus preguntas. Llvelas cuando concurra a las visitas prenatales.  Concurra a todas las visitas prenatales como se lo haya indicado el mdico. Esto es importante. Seguridad  Colquese el cinturn de seguridad cuando conduzca.  Haga una lista de los nmeros de telfono de emergencia, que incluya los nmeros de telfono de familiares, amigos, el hospital, as como los departamentos de polica y bomberos. Instrucciones generales  Consulte a su mdico sobre los alimentos que debe comer o pdale que la ayude a encontrar a quien pueda aconsejarla si necesita ese servicio.    Consulte a su mdico acerca de dnde se dictan clases prenatales cerca de donde vive. Comience las clases antes del mes 6 de embarazo.  No se d baos de inmersin en agua caliente, baos turcos ni saunas.  No se haga duchas vaginales ni use tampones o toallas higinicas perfumadas.   No mantenga las piernas cruzadas durante mucho tiempo.  Vaya al dentista si an no lo hizo. Use un cepillo de cerdas suaves para cepillarse los dientes. Psese el hilo dental suavemente.  No fume, no consuma hierbas ni beba alcohol. No tome frmacos que el mdico no haya autorizado.  No consuma ningn producto que contenga nicotina o tabaco, como cigarrillos y cigarrillos electrnicos. Si necesita ayuda para dejar de fumar, consulte al mdico.  Evite el contacto con las bandejas sanitarias de los gatos y la tierra que estos animales usan. Estos elementos contienen bacterias que pueden causar defectos congnitos al beb y la posible prdida del beb (aborto espontneo) o la muerte fetal. Comunquese con un mdico si:  Tiene clicos leves o siente presin en la parte baja del vientre.  Tiene dolor al hacer pis (orinar).  Advierte un lquido con olor ftido que proviene de la vagina.  Tiene malestar estomacal (nuseas), devuelve (vomita) o tiene deposiciones acuosas (diarrea).  Sufre un dolor persistente en el abdomen.  Siente mareos. Solicite ayuda de inmediato si:  Tiene fiebre.  Tiene una prdida de lquido por la vagina.  Tiene sangrado o pequeas prdidas vaginales.  Siente dolor intenso o clicos en el abdomen.  Sube o baja de peso rpidamente.  Tiene dificultades para recuperar el aliento y siente dolor en el pecho.  Sbitamente se le hinchan mucho el rostro, las manos, los tobillos, los pies o las piernas.  No ha sentido los movimientos del beb durante una hora.  Siente un dolor de cabeza intenso que no se alivia al tomar medicamentos.  Tiene dificultad para ver. Resumen  El segundo trimestre va desde la semana14 hasta la 27, desde el mes 4 hasta el 6. Este suele ser el momento en el que mejor se siente.  Para cuidarse y cuidar a su beb en gestacin, debe comer alimentos saludables, tomar medicamentos solamente si su mdico le indica que lo haga y hacer  actividades que sean seguras para usted y su beb.  Llame al mdico si se enferma o si nota algo inusual acerca de su embarazo. Tambin llame al mdico si necesita ayuda para saber qu alimentos debe comer o si quiere saber qu actividades puede realizar de forma segura. Esta informacin no tiene como fin reemplazar el consejo del mdico. Asegrese de hacerle al mdico cualquier pregunta que tenga. Document Revised: 09/26/2016 Document Reviewed: 09/26/2016 Elsevier Patient Education  2020 Elsevier Inc.  

## 2020-01-19 ENCOUNTER — Telehealth: Payer: Self-pay | Admitting: *Deleted

## 2020-01-19 NOTE — Telephone Encounter (Signed)
Patient left a message today on nurse voicemail in Spanish which nurse cannot interpret. I called patient back with [facility] [phone] and explained we got her message but did not have interpreter and am calling to see what her question or concern is.  She states she is having cold symptoms including cough, throat pain, throat itching. She denies fever. She denies being around someone with known covid.  I instructed her since she is pregnant she is high risk for covid and recommended she get tested for covid. Information given for how to get covid testing given. Also informed her of safe otc medications for cough and sore throat per our protocol. She voices understanding. Pj Zehner,RN

## 2020-02-05 ENCOUNTER — Other Ambulatory Visit: Payer: Self-pay

## 2020-02-05 ENCOUNTER — Encounter: Payer: Self-pay | Admitting: *Deleted

## 2020-02-05 ENCOUNTER — Ambulatory Visit: Payer: Medicaid Other | Attending: Obstetrics and Gynecology

## 2020-02-05 ENCOUNTER — Ambulatory Visit: Payer: Medicaid Other | Admitting: *Deleted

## 2020-02-05 ENCOUNTER — Other Ambulatory Visit: Payer: Self-pay | Admitting: *Deleted

## 2020-02-05 DIAGNOSIS — O98712 Human immunodeficiency virus [HIV] disease complicating pregnancy, second trimester: Secondary | ICD-10-CM

## 2020-02-05 DIAGNOSIS — O0990 Supervision of high risk pregnancy, unspecified, unspecified trimester: Secondary | ICD-10-CM

## 2020-02-05 DIAGNOSIS — O322XX0 Maternal care for transverse and oblique lie, not applicable or unspecified: Secondary | ICD-10-CM | POA: Diagnosis not present

## 2020-02-05 DIAGNOSIS — Z789 Other specified health status: Secondary | ICD-10-CM

## 2020-02-05 DIAGNOSIS — Z3A24 24 weeks gestation of pregnancy: Secondary | ICD-10-CM

## 2020-02-05 DIAGNOSIS — Z363 Encounter for antenatal screening for malformations: Secondary | ICD-10-CM

## 2020-02-05 DIAGNOSIS — O99212 Obesity complicating pregnancy, second trimester: Secondary | ICD-10-CM | POA: Diagnosis not present

## 2020-02-05 DIAGNOSIS — Z98891 History of uterine scar from previous surgery: Secondary | ICD-10-CM | POA: Diagnosis present

## 2020-02-05 DIAGNOSIS — B20 Human immunodeficiency virus [HIV] disease: Secondary | ICD-10-CM | POA: Diagnosis not present

## 2020-02-05 DIAGNOSIS — O09522 Supervision of elderly multigravida, second trimester: Secondary | ICD-10-CM

## 2020-02-08 ENCOUNTER — Telehealth: Payer: Self-pay

## 2020-02-08 NOTE — Telephone Encounter (Signed)
Received call in triage from patient. Stating she needs refills. Patient made aware that she has refills on file and confirmed that patient knew which pharmacy to pick up medication.  Also confirmed with pharmacy that patient has 3 refills on file [facility]

## 2020-02-15 ENCOUNTER — Encounter: Payer: Self-pay | Admitting: Obstetrics and Gynecology

## 2020-02-15 ENCOUNTER — Ambulatory Visit (INDEPENDENT_AMBULATORY_CARE_PROVIDER_SITE_OTHER): Payer: Medicaid Other | Admitting: Obstetrics and Gynecology

## 2020-02-15 ENCOUNTER — Other Ambulatory Visit: Payer: Self-pay

## 2020-02-15 ENCOUNTER — Other Ambulatory Visit: Payer: Medicaid Other

## 2020-02-15 VITALS — BP 119/77 | HR 74 | Wt 206.6 lb

## 2020-02-15 DIAGNOSIS — O09522 Supervision of elderly multigravida, second trimester: Secondary | ICD-10-CM

## 2020-02-15 DIAGNOSIS — O0990 Supervision of high risk pregnancy, unspecified, unspecified trimester: Secondary | ICD-10-CM | POA: Diagnosis not present

## 2020-02-15 DIAGNOSIS — Z789 Other specified health status: Secondary | ICD-10-CM

## 2020-02-15 DIAGNOSIS — Z98891 History of uterine scar from previous surgery: Secondary | ICD-10-CM

## 2020-02-15 DIAGNOSIS — Z23 Encounter for immunization: Secondary | ICD-10-CM

## 2020-02-15 DIAGNOSIS — O98712 Human immunodeficiency virus [HIV] disease complicating pregnancy, second trimester: Secondary | ICD-10-CM

## 2020-02-15 NOTE — Addendum Note (Signed)
Addended by: Bethanne Ginger on: 02/15/2020 11:14 AM   Modules accepted: Orders

## 2020-02-15 NOTE — Patient Instructions (Signed)
AREA PEDIATRIC/FAMILY PRACTICE PHYSICIANS  [place] ([address]) . [facility] o Chambliss, MD; Eniola, MD; Hale, MD; Hensel, MD; McDiarmid, MD; McIntyer, MD; Evangelina Delancey, MD; Walden, MD o [address] [place], [place] [address] o [phone] o Mon-Fri 8:30-12:30, 1:30-5:00 o Providers come to see babies at [facility] o Accepting Medicaid . [facility] o Limited providers who accept newborns: Koirala, MD; Morrow, MD; Wolters, MD o [address], [place], [place] [address] o [phone] o Mon-Fri 8:00-5:30 o Babies seen by providers at [facility] o Does NOT accept Medicaid o Please call early in hospitalization for appointment (limited availability)  . [facility] o Mulberry, MD o [address] [place], [place] [address] o [phone] o Mon, Tue, Thur, Fri 8:30-5:00, Wed 10:00-7:00 (closed 1-2pm) o Babies seen by [facility] providers o Accepting Medicaid . [facility] Pediatric[facility] o Rubin, MD o [address], [place], [place] [address] o [phone] o Mon-Fri 8:30-5:00, Sat 8:30-12:00 o Provider comes to see babies at [facility] o Accepting Medicaid o Must have been referred from current patients or contacted office prior to delivery . [facility] ([facility]) o Brown, MD; Chandler, MD; Ettefagh, MD; Grant, MD; Lester, MD; McCormick, MD; McQueen, MD; Prose, MD; Simha, MD; Stanley, MD; Stryffeler, NP; Tebben, NP o [address], [place], [place] [address] o [phone] o Mon, Tue, Thur, Fri 8:30-5:30, Wed 9:30-5:30, Sat 8:30-12:30 o Babies seen by [facility] providers o Accepting Medicaid o Only accepting infants of first-time parents or siblings of current patients o Hospital discharge coordinator will make follow-up appointment . Jack Amos o [address],  [place], [place]  [address] o [phone]   Fax - [phone] . [facility] o [address], [place], [place]  [address] o Phone - [phone]   Fax - [phone] . Shilpa Gosrani o [address], [place], [place]  [address] o [phone]  [place] ([address]) . [facility] o Bates, MD; [place]assfield, MD; Cooper, Cox, MD; MD; Davis, MD; Dovico, MD; Ettefaugh, MD; Little, MD; Lowe, MD; Keiffer, MD; Melvin, MD; Sumner, MD; Williams, MD o [address], [place], [place] [address] o [phone] o Mon-Fri 8:30-5:00 (extended evenings Mon-Thur as needed), Sat-Sun 10:00-1:00 o Providers come to see babies at [facility] o Accepting Medicaid for families of first-time babies and families with all children in the household age 3 and under. Must register with office prior to making appointment (M-F only). . [facility] o Henson, NP; Knapp, MD; Lalonde, MD; Tysinger, PA o [address] [place], [place] [address] o [phone] o Mon-Fri 8:00-5:00 o Babies seen by providers at [facility] o Does NOT accept Medicaid/Commercial Insurance Only . [facility] ([facility])  o Artis, MD; Barnes, MD; Bratton, MD; Coccaro, MD; Lockett Gardner, MD; Kramer, MD; Marshall, MD; Netherton, MD; Poleto, MD; Skinner, MD o [address] [place], [place] [address] o [phone] o Mon-Fri 8:30-5:30, Sat (Oct.-Mar.) 9:00-1:00 o Babies seen by providers at [facility] o Accepting Medicaid  [place] ([address]) . [facility] o Reid, MD; Warner, MD o [address], ,  [address] o [phone] o Mon-Fri 8:30-5:00, Sat 8:30-12:00 o Providers come to see babies at [facility] o Does NOT accept Medicaid . [facility] o Becker, PA; Hagler, MD; Scifres, PA; Sun, MD; Swayne, MD o [address],  [place], [place] [address] o [phone] o Mon-Fri 8:00-5:00 o Babies seen by providers at [facility] o Does NOT accept Medicaid o Only accepting babies of parents who are patients o Please call early in hospitalization for appointment (limited availability) . [facility] o Clark, MD; Frye, MD; Kelleher, MD; Mack, NP; Miller, MD; O'Keller, MD; Patterson, NP; Pudlo, MD; Puzio, MD; Thomas, MD; Tucker, MD; Twiselton, MD o [address], [place], [place] [address] o [phone] o Mon-Fri 8:00-5:00, Sat 9:00-12:00 o Providers come to see babies at [facility] o Does NOT accept Medicaid  [place] ([address]) . [facility] o Limited providers accepting new patients: Brake, NP; Wharton, PA o [address], [place], [place] [address] o [phone] o Mon-Fri 8:00-5:00 o Babies seen by providers at [facility] o Does NOT accept Medicaid o Only accepting babies of parents who are patients o Please call early in hospitalization for appointment (limited availability) . [facility] o Gay, MD; Quinlan, MD o [address] [place], [place] [address] o [phone] (press 1 to schedule appointment) o Mon-Fri 8:00-5:00 o Providers come to see babies at [facility] o Does NOT accept Medicaid . [facility] o Mazer, MD o [address] [place], [place] [address] o [phone] o Mon-Fri 8:30-5:00 (lunch 12:30-1:00), extended hours by appointment only Wed 5:00-6:30 o Babies seen by [facility] providers o Accepting Medicaid . [facility] o Banks, MD; Jordan, MD; Koberlein, MD o [address], [place], [place] [address] o [phone] o Mon-Fri 8:00-5:00 o Babies seen by [facility] providers o Does NOT accept Medicaid .  [facility] o Parker, MD; Hunter, MD; Wallace, DO o [address] [place], [place]  [address] o [phone] o Mon-Fri 8:00-5:00 o Babies seen by [facility] providers o Does NOT accept Medicaid . [facility] o Brandon, PA; Brecken, PA; Christy, NP; Dees, MD; DeClaire, MD; DeWeese, MD; Hansen, NP; Mills, NP; Parrish, NP; Smoot, NP; Summer, MD; Vapne, MD o [address] [place], [place] [address] o [phone] o Mon-Fri 8:30-5:00, Sat 10:00-1:00 o Providers come to see babies at [facility] o Does NOT accept Medicaid o Free prenatal information session Tuesdays at 4:45pm . [facility] o Bouska, MD; Gordon, PA; Jeffery, PA; Weber, PA o [address] [place] [address] o [phone] o Mon-Fri 7:30-5:30 o Babies seen by [facility] providers . [facility] o [address], [place], [place]  [address] o [phone]   Fax - [phone]  [place] ([address] & [address]) . [facility] o Reese, MD o [address] [place], [place] [address] o [phone] o Mon-Thur 8:00-6:00 o Providers come to see babies at [facility] o Accepting Medicaid . [facility] o Anderson, NP; Badger, MD; Beal, PA; Spencer, PA o [address] [place], [place] [address] o [phone] o Mon-Thur 7:30-7:30, Fri 7:30-4:30 o Babies seen by [facility] providers o Accepting Medicaid . [facility] o Agbuya, MD; Klett, NP; Romgoolam, MD o [address], [place], [place] [address] o [phone] o Mon-Fri 8:30-5:00, Sat 8:30-12:00 o Providers come to see babies at [facility] o Accepting Medicaid o Must have "Meet & Greet" appointment at office prior to delivery . [facility] ([facility] o McCord,   MD; Wallace, MD; Wood, MD o [address], [place], [place] [address] o [phone] o Mon-Wed 8:00-6:00, Thur-Fri 8:00-5:00, Sat 9:00-12:00 o Providers come to  see babies at [facility] o Does NOT accept Medicaid o Only accepting siblings of current patients . [facility]  o [address], [place], [place]  [address] o [phone]   Fax - [phone] . [facility] o [address], [place], [place]  [address] o [phone]   Fax - [phone]  [place] ([address] & [address]) . [facility] o Cirigliano, DO; Matthews, DO o [address] [place], [place] [address] o [phone] o Mon-Fri 7:00-5:00 o Babies seen by [facility] providers o Does NOT accept Medicaid . [facility] o Briscoe, MD; Howley, PA; Moreira, PA o [address], [place], [place] [address] o [phone] o Mon-Fri 8:00-5:00 o Babies seen by [facility] providers o Accepting Medicaid . [facility] o Boyd, MD; Church, PA; Jones, NP; Osborn, PA o [address], [place], [place] [address] o [phone] o Mon-Fri 8:00-5:00 o Babies seen by providers at [facility] o Accepting Medicaid  [place] ([address]) .  [facility] [place] o Wendling, DO o [address] [place], [place] [address] o [phone] o Mon-Fri 8:00-5:00 o Babies seen by [facility] providers o Does NOT accept Medicaid o Limited availability, please call early in hospitalization to schedule follow-up . [facility] o Calderon, PA; Cummings, MD; Dillard, MD; Martin, PA; Olson, MD; VanDeven, PA o [address], [place], [place] [address] o [phone] o Mon-Fri 8:30-5:00, Sat 9:00-12:00 o Babies seen by providers at [facility] o Accepting Medicaid o Please register online then schedule online or call office o [email or web address] . [facility] ([facility] o Hunter, NP; Kumar, MD; Martin Rogers, PA o [address], [place], [place] [address] o [phone] o Mon-Fri 8:00-5:00 o Babies seen by providers at [facility] o Accepting Medicaid . [facility] ([facility] o Caspar, MD; Kristi Fleenor, NP; West, MD o [address], [place], [place] [address] o [phone] o Mon-Fri 8:00-5:30, Sat&Sun by appointment (phones open at 8:30) o Babies seen by [facility] providers o Accepting Medicaid o Must be a first-time baby or sibling of current patient . [facility] [place]  o [address], [place], [place]  [address] o [phone]   Fax - [phone]  [place] ([address]) . [facility] o Brown, PA; Cowen, PA; Rice, MD; Helton, PA; Spry, MD o [address] [place], [place] [address] o [phone] o Mon-Thur 8:00-7:00, Fri 8:00-5:00, Sat 8:00-12:00, Sun 9:00-12:00 o Babies seen by [facility] providers o Accepting Medicaid . [facility] o Coe-Goins, MD; Marshall, MD; Pierre-Louis, MD o [address] [place] [address], [place], [place] [address] o [phone] o Mon-Thur 8:00-5:00 o Babies seen by providers at [facility] o Accepting Medicaid . [facility] o Bratton, MD; Coe-Goins, MD; Hayes, MD; Lewis, MD; List, MD; Lott, MD; Marshall, MD; Moran, MD; O'Reginal Wojcicki, MD; Pierre-Louis, MD; Pitonzo, MD; Scholer, MD; Spangle, MD o [address] [place],    [address] o [phone] o Mon-Fri 8:00-5:30, Sat (Oct.-Mar.) 9:00-1:00 o Babies seen by providers at [facility] o Accepting Medicaid o Must fill out new patient packet, available online at [email or web address] . [facility] ([facility] o Friddle, NP; Harris, NP; Kelly, NP; Logan, MD;  Melvin, PA; Poth, MD; Ramadoss, MD; Stanton, NP o [address], [place], [place] [address] o [phone] o Mon-Thur 8:00-5:30, Fri 8:00-5:00 o Babies seen by providers at [facility] o Accepting Medicaid  [place] ([address]) . [facility] o Dixon, PA; Hamlet, MD; Pickard, MD; Tapia, PA o [address], [place], [place] [address] o [phone] o Mon-Fri 8:00-5:00 o Babies seen by providers at [facility] o Accepting Medicaid   [place] ([address]) . [facility] [place] o Masneri, DO; Meyers, MD; Nelson, PA o [address], [place], [place] [address] o [phone] o Mon-Fri 8:00-5:00 o Babies seen by providers at [facility] o Does NOT accept Medicaid o Limited appointment availability, please call early in hospitalization  . [facility] [place] o Kunedd, DO; McGowen, MD o [address], [place], [place] [address] o [phone] o Mon-Fri 8:00-5:00 o Babies seen by [facility] providers o Does NOT accept Medicaid . [facility] [place] o Cameron, MD; MacDonald, MD; Michaels, PA; Nayak, MD o [address] [place] [address], [place], [place] [address] o [phone] o Mon-Fri 8:00-5:00 o After hours clinic ([address] [place], [place] [address]) [phone] Mon-Fri 5:00-8:00, Sat 12:00-6:00, Sun 10:00-4:00 o Babies seen by [facility] providers o Accepting Medicaid . [facility] [place] o [address], [place], [place]  [address] o [phone]   Fax - [phone]  [place] ([address]) . [facility] [place] [facility] o Andy, MD o [address], [place], [place] [address] o [phone] o Mon-Fri 8:00-5:00 o Babies seen by [facility] providers o Does NOT accept Medicaid . [facility] [place] ([facility] [place]) o Eksir, MD o [address], [place], [place]  [address] o [phone] o Mon-Thur 8:00-7:00, Fri 8:00-5:00, Sat 8:00-12:00 o Babies seen by providers at [facility] o Accepting Medicaid - but does not have vaccinations in office (must be received elsewhere) o Limited availability, please call early in hospitalization  [place] ([address]) . [facility]  o Charlene Flemming, MD o [address],  [place] [address] o [phone]  Fax [phone]   

## 2020-02-15 NOTE — Progress Notes (Signed)
Subjective:  Miranda George is a 40 y.o. [phone] at [redacted]w[redacted]d being seen today for ongoing prenatal care.  She is currently monitored for the following issues for this high-risk pregnancy and has Supervision of high risk pregnancy, antepartum; AMA (advanced maternal age) multigravida 76+; Language barrier; HIV disease affecting pregnancy; and History of C-section on their problem list.  Patient reports general discomforts of pregnancy.  Contractions: Irritability. Vag. Bleeding: None.  Movement: Present. Denies leaking of fluid.   The following portions of the patient's history were reviewed and updated as appropriate: allergies, current medications, past family history, past medical history, past social history, past surgical history and problem list. Problem list updated.  Objective:   Vitals:   02/15/20 0830  BP: 119/77  Pulse: 74  Weight: 206 lb 9.6 oz (93.7 kg)    Fetal Status: Fetal Heart Rate (bpm): 155   Movement: Present     General:  Alert, oriented and cooperative. Patient is in no acute distress.  Skin: Skin is warm and dry. No rash noted.   Cardiovascular: Normal heart rate noted  Respiratory: Normal respiratory effort, no problems with respiration noted  Abdomen: Soft, gravid, appropriate for gestational age. Pain/Pressure: Present     Pelvic:  Cervical exam deferred        Extremities: Normal range of motion.  Edema: None  Mental Status: Normal mood and affect. Normal behavior. Normal judgment and thought content.   Urinalysis:      Assessment and Plan:  Pregnancy: [id number] at [redacted]w[redacted]d  1. Supervision of high risk pregnancy, antepartum Stable Glucola today Growth 44 % 02/05/20  2. HIV disease affecting pregnancy in second trimester Stable Continue with serial growth scans Followed by ID  3. Multigravida of advanced maternal age in second trimester Negative tetsing  4. History of C-section Mode of delivery TBD by viral load  5. Language barrier Live  interrupter used during today's visit  Preterm labor symptoms and general obstetric precautions including but not limited to vaginal bleeding, contractions, leaking of fluid and fetal movement were reviewed in detail with the patient. Please refer to After Visit Summary for other counseling recommendations.  Return in about 4 weeks (around 03/14/2020) for virtual, OB visit, any provider.   Chancy Milroy, MD

## 2020-02-16 LAB — COMPREHENSIVE METABOLIC PANEL
ALT: 37 IU/L — ABNORMAL HIGH (ref 0–32)
AST: 52 IU/L — ABNORMAL HIGH (ref 0–40)
Albumin/Globulin Ratio: 1 — ABNORMAL LOW (ref 1.2–2.2)
Albumin: 3.4 g/dL — ABNORMAL LOW (ref 3.8–4.8)
Alkaline Phosphatase: 114 IU/L (ref 44–121)
BUN/Creatinine Ratio: 15 (ref 9–23)
BUN: 9 mg/dL (ref 6–20)
Bilirubin Total: 0.2 mg/dL (ref 0.0–1.2)
CO2: 14 mmol/L — ABNORMAL LOW (ref 20–29)
Calcium: 8.8 mg/dL (ref 8.7–10.2)
Chloride: 109 mmol/L — ABNORMAL HIGH (ref 96–106)
Creatinine, Ser: 0.59 mg/dL (ref 0.57–1.00)
GFR calc Af Amer: 135 mL/min/{1.73_m2} (ref >59)
GFR calc non Af Amer: 117 mL/min/{1.73_m2} (ref >59)
Globulin, Total: 3.3 g/dL (ref 1.5–4.5)
Glucose: 113 mg/dL — ABNORMAL HIGH (ref 65–99)
Potassium: 4.3 mmol/L (ref 3.5–5.2)
Sodium: 138 mmol/L (ref 134–144)
Total Protein: 6.7 g/dL (ref 6.0–8.5)

## 2020-02-16 LAB — CBC
Hematocrit: 33.8 % — ABNORMAL LOW (ref 34.0–46.6)
Hemoglobin: 11.3 g/dL (ref 11.1–15.9)
MCH: 29.7 pg (ref 26.6–33.0)
MCHC: 33.4 g/dL (ref 31.5–35.7)
MCV: 89 fL (ref 79–97)
Platelets: 298 x10E3/uL (ref 150–450)
RBC: 3.81 x10E6/uL (ref 3.77–5.28)
RDW: 12.1 % (ref 11.7–15.4)
WBC: 6 x10E3/uL (ref 3.4–10.8)

## 2020-02-16 LAB — GLUCOSE TOLERANCE, 2 HOURS W/ 1HR
Glucose, 1 hour: 225 mg/dL — ABNORMAL HIGH (ref 65–179)
Glucose, 2 hour: 192 mg/dL — ABNORMAL HIGH (ref 65–152)
Glucose, Fasting: 107 mg/dL — ABNORMAL HIGH (ref 65–91)

## 2020-02-16 LAB — HIV 1/2 AB DIFFERENTIATION
HIV 1 Ab: REACTIVE
HIV 2 Ab: NONREACTIVE
NOTE (HIV CONF MULTIP: POSITIVE — AB

## 2020-02-16 LAB — HIV ANTIBODY (ROUTINE TESTING W REFLEX): HIV Screen 4th Generation wRfx: REACTIVE

## 2020-02-19 ENCOUNTER — Telehealth: Payer: Self-pay

## 2020-02-19 DIAGNOSIS — O24419 Gestational diabetes mellitus in pregnancy, unspecified control: Secondary | ICD-10-CM

## 2020-02-19 MED ORDER — GLUCOSE BLOOD VI STRP: ORAL_STRIP | 12 refills | Status: DC

## 2020-02-19 MED ORDER — ACCU-CHEK SOFTCLIX LANCETS MISC: 12 refills | Status: DC

## 2020-02-19 MED ORDER — ACCU-CHEK GUIDE W/DEVICE KIT: 1.0000 | PACK | 0 refills | Status: DC | PRN

## 2020-02-19 NOTE — Telephone Encounter (Addendum)
-----   Message from Chancy Milroy, MD sent at 02/18/2020  1:16 PM EST ----- Please let pt know that she failed her Glucola. Please refer for DM education and sent in Rx for diabetic supplies. Make next OB appt face to face with MD if not already.  Thanks Legrand Como    Notified pt of results with Spanish Interpreter Raquel M that we have scheduled her for diabetes education on 02/25/20 @ 0915.  I have also e-prescribed diabetes supplies to her [place] on [employer].  Pt advised to bring testing supplies to appt.  Pt verbalized understanding with no further questions.  Diabetes supplies e-prescribed.    Mel Almond, RN  02/19/20

## 2020-02-25 ENCOUNTER — Other Ambulatory Visit: Payer: Self-pay

## 2020-02-25 ENCOUNTER — Ambulatory Visit: Payer: Medicaid Other | Admitting: Registered"

## 2020-02-25 ENCOUNTER — Encounter: Payer: Medicaid Other | Attending: Obstetrics & Gynecology | Admitting: Registered"

## 2020-02-25 DIAGNOSIS — O24419 Gestational diabetes mellitus in pregnancy, unspecified control: Secondary | ICD-10-CM

## 2020-02-25 DIAGNOSIS — O98719 Human immunodeficiency virus [HIV] disease complicating pregnancy, unspecified trimester: Secondary | ICD-10-CM | POA: Insufficient documentation

## 2020-02-25 DIAGNOSIS — Z21 Asymptomatic human immunodeficiency virus [HIV] infection status: Secondary | ICD-10-CM | POA: Insufficient documentation

## 2020-02-25 DIAGNOSIS — Z3A00 Weeks of gestation of pregnancy not specified: Secondary | ICD-10-CM | POA: Insufficient documentation

## 2020-02-25 NOTE — Progress Notes (Signed)
Interpreter services provided by Clovis Fredrickson [phone] from [employer]  Patient was seen for weight gain in pregnanacy on 11/24/2019.   Returns 02/25/20 for Gestational Diabetes self-management. EDD 05/23/20. Patient states no history of GDM. Diet history obtained. Patient eats variety of all food groups. Beverages include milk, water, juice?  Patient visit was 30 min due to delayed [place] Popper ride will return to complete education:  The following learning objectives were met by the patient :   States the definition of Gestational Diabetes  States why dietary management is important in controlling blood glucose  Describes the effects of carbohydrates on blood glucose levels  States when to check blood glucose levels  Demonstrates proper blood glucose monitoring techniques    Follow-up visit to cover:  Demonstrates ability to create a balanced meal plan  Demonstrates carbohydrate counting   States the effect of stress and exercise on blood glucose levels  States the importance of limiting caffeine and abstaining from alcohol and smoking  Plan:  Begin checking Blood Glucose before breakfast and 2 hours after first bite of breakfast, lunch and dinner as directed by MD  Bring Log Book/Sheet and meter to every medical appointment  Baby Scripts: (BS 2.0 not capable of glucose management at this time.) Patient to record blood sugar on glucose log sheet  Take medication if directed by MD  Patient already has a meter Reading during visit 172 mg/dL ~ 1 hr post meal of soup with chicken and non-starch vegetables.  PMH: Pt states tested for PCOS long time ago in [place] and [place]. Then told she had myomas in uterus but did not have follow-up, pain symptoms stopped and was told myomas were gone, but patient she doesn't understand how. Pt also states that she when withou a period x8 months, back in hopsital d/t pain.  Patient instructed to monitor glucose levels: FBS: 60 - 95 mg/dl 2 hour: <120  mg/dl  Patient received the following handouts:  Nutrition Diabetes and Pregnancy  Carbohydrate Counting List  Blood glucose Log Sheet  Patient will be seen for follow-up in 1 week or as needed.

## 2020-02-29 ENCOUNTER — Other Ambulatory Visit: Payer: Self-pay

## 2020-02-29 ENCOUNTER — Other Ambulatory Visit: Payer: Medicaid Other

## 2020-02-29 DIAGNOSIS — O98719 Human immunodeficiency virus [HIV] disease complicating pregnancy, unspecified trimester: Secondary | ICD-10-CM | POA: Diagnosis not present

## 2020-03-03 LAB — CBC WITH DIFFERENTIAL/PLATELET
Absolute Monocytes: 403 cells/uL (ref 200–950)
Basophils Absolute: 22 cells/uL (ref 0–200)
Basophils Relative: 0.4 %
Eosinophils Absolute: 73 cells/uL (ref 15–500)
Eosinophils Relative: 1.3 %
HCT: 33.5 % — ABNORMAL LOW (ref 35.0–45.0)
Hemoglobin: 11.3 g/dL — ABNORMAL LOW (ref 11.7–15.5)
Lymphs Abs: 1226 cells/uL (ref 850–3900)
MCH: 29.4 pg (ref 27.0–33.0)
MCHC: 33.7 g/dL (ref 32.0–36.0)
MCV: 87 fL (ref 80.0–100.0)
MPV: 10.6 fL (ref 7.5–12.5)
Monocytes Relative: 7.2 %
Neutro Abs: 3875 cells/uL (ref 1500–7800)
Neutrophils Relative %: 69.2 %
Platelets: 311 10*3/uL (ref 140–400)
RBC: 3.85 10*6/uL (ref 3.80–5.10)
RDW: 11.9 % (ref 11.0–15.0)
Total Lymphocyte: 21.9 %
WBC: 5.6 10*3/uL (ref 3.8–10.8)

## 2020-03-03 LAB — COMPREHENSIVE METABOLIC PANEL
AG Ratio: 1 (calc) (ref 1.0–2.5)
ALT: 40 U/L — ABNORMAL HIGH (ref 6–29)
AST: 57 U/L — ABNORMAL HIGH (ref 10–30)
Albumin: 3.3 g/dL — ABNORMAL LOW (ref 3.6–5.1)
Alkaline phosphatase (APISO): 124 U/L (ref 31–125)
BUN: 11 mg/dL (ref 7–25)
CO2: 20 mmol/L (ref 20–32)
Calcium: 9.3 mg/dL (ref 8.6–10.2)
Chloride: 108 mmol/L (ref 98–110)
Creat: 0.69 mg/dL (ref 0.50–1.10)
Globulin: 3.3 g/dL (calc) (ref 1.9–3.7)
Glucose, Bld: 104 mg/dL — ABNORMAL HIGH (ref 65–99)
Potassium: 4.2 mmol/L (ref 3.5–5.3)
Sodium: 136 mmol/L (ref 135–146)
Total Bilirubin: 0.3 mg/dL (ref 0.2–1.2)
Total Protein: 6.6 g/dL (ref 6.1–8.1)

## 2020-03-03 LAB — HIV-1 RNA QUANT-NO REFLEX-BLD
HIV 1 RNA Quant: <20 Copies/mL
HIV-1 RNA Quant, Log: <1.3 Log cps/mL

## 2020-03-04 ENCOUNTER — Other Ambulatory Visit: Payer: Self-pay

## 2020-03-04 ENCOUNTER — Other Ambulatory Visit: Payer: Self-pay | Admitting: *Deleted

## 2020-03-04 ENCOUNTER — Ambulatory Visit: Payer: Medicaid Other | Admitting: *Deleted

## 2020-03-04 ENCOUNTER — Encounter: Payer: Self-pay | Admitting: *Deleted

## 2020-03-04 ENCOUNTER — Ambulatory Visit: Payer: Medicaid Other | Attending: Obstetrics

## 2020-03-04 DIAGNOSIS — Z789 Other specified health status: Secondary | ICD-10-CM | POA: Diagnosis present

## 2020-03-04 DIAGNOSIS — O99212 Obesity complicating pregnancy, second trimester: Secondary | ICD-10-CM | POA: Diagnosis not present

## 2020-03-04 DIAGNOSIS — O0990 Supervision of high risk pregnancy, unspecified, unspecified trimester: Secondary | ICD-10-CM | POA: Diagnosis present

## 2020-03-04 DIAGNOSIS — O98719 Human immunodeficiency virus [HIV] disease complicating pregnancy, unspecified trimester: Secondary | ICD-10-CM

## 2020-03-04 DIAGNOSIS — E669 Obesity, unspecified: Secondary | ICD-10-CM

## 2020-03-04 DIAGNOSIS — Z98891 History of uterine scar from previous surgery: Secondary | ICD-10-CM | POA: Diagnosis present

## 2020-03-04 DIAGNOSIS — O98712 Human immunodeficiency virus [HIV] disease complicating pregnancy, second trimester: Secondary | ICD-10-CM | POA: Insufficient documentation

## 2020-03-04 DIAGNOSIS — Z3A28 28 weeks gestation of pregnancy: Secondary | ICD-10-CM | POA: Diagnosis not present

## 2020-03-04 DIAGNOSIS — O09522 Supervision of elderly multigravida, second trimester: Secondary | ICD-10-CM | POA: Diagnosis not present

## 2020-03-04 DIAGNOSIS — O24419 Gestational diabetes mellitus in pregnancy, unspecified control: Secondary | ICD-10-CM | POA: Diagnosis not present

## 2020-03-07 ENCOUNTER — Ambulatory Visit (INDEPENDENT_AMBULATORY_CARE_PROVIDER_SITE_OTHER): Payer: Medicaid Other | Admitting: Internal Medicine

## 2020-03-07 ENCOUNTER — Encounter: Payer: Self-pay | Admitting: Internal Medicine

## 2020-03-07 ENCOUNTER — Other Ambulatory Visit: Payer: Self-pay

## 2020-03-07 VITALS — BP 117/81 | HR 72 | Temp 97.5°F | Ht 59.0 in | Wt 211.0 lb

## 2020-03-07 DIAGNOSIS — B20 Human immunodeficiency virus [HIV] disease: Secondary | ICD-10-CM

## 2020-03-07 DIAGNOSIS — Z3490 Encounter for supervision of normal pregnancy, unspecified, unspecified trimester: Secondary | ICD-10-CM

## 2020-03-07 NOTE — Patient Instructions (Addendum)
You are doing very well with your HIV  Call your pharmacy today to get refill on  truvada tivicay  Continue taking these medications everyday  If you have any question please give our clinic a call.  Please have your gynecologist check another viral load within a month before you give birth   Please follow up in 3 months with 1 week preclinic blood tests

## 2020-03-07 NOTE — Progress Notes (Signed)
[facility]  Patient Active Problem List   Diagnosis Date Noted  . Gestational diabetes 02/19/2020  . Supervision of high risk pregnancy, antepartum 11/13/2019  . AMA (advanced maternal age) multigravida 35+ 11/13/2019  . Language barrier 11/13/2019  . HIV disease affecting pregnancy 11/13/2019  . History of C-section 11/13/2019      Subjective:    Patient ID: Miranda George, female    DOB: [birth year], 40 y.o.   MRN: [id number]  Chief Complaint  Patient presents with  . Follow-up    Declined condoms     HPI:  Miranda George is a 40 y.o. female recently dx'ed hiv, [id number], currently [redacted] week pregnant, referred here for new HIV ART start  03/07/20 id f/u Doing well Good prenatal care Gestational diabetes -- advised diet/exercise; no oral or insulin yet No f/c No vaginal bleeding/discharge No rash Husband still not getting hiv testing repeat/care yet; patient tried to call [facility] but no information yet Compliant with truvada/tivicay Reviewed labs; hiv undetectable   background -------------  She was seen in the ED on 12/03 for abd pain and vaginal bleeding, briefly admitted and dx'ed with subchorionic hematoma. Gc/chlamydia, wet mount negative. The vaginal bleeding had stopped yesterday, and the abdominal pain had also gone  #HIV Dx'ed 10/2019; heterosexual; denies ivdu/promiscuity; patient is married Patient had some blood transfusion during her 2nd pregnancy Husband tested and told her he was negative 2 months ago although there is some question as he tried to call back to the clinic and haven't heard Patient married 1 year; together for 4 years She was seen in [facility] clinic on 10/28 with our pharmacist Cassie and started on tivicay and truvada. She had some nausea at the beginning but no longer. She doesn't miss any dose. 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,  v179I/V, R211K PR mutation -- I62v, L63p, v77I/V, I93L There were no phenotypic resistance to NRTI, nnrti, or PI Baseline HIV labs: [id number] negative 11/12/2019 hepatitis b sAb, sAg, cAb; hep cAb negative; hep A Ab positive 11/12/2019 quant gold negative  She has fatigue since pregnancy and that hasn't changed She also endorsed some mild depression but no hi/si (because of positive hiv)  No fever, chill, nightsweat No headache, visual change, blurriness No cough, chest pain, sob No sore throat No diarrhea No urinary urgency, dysuria, flank pain No bruising No joint pain, myalgia, muscle weakness No focal weakness No rash   No Known Allergies    Outpatient Medications Prior to Visit  Medication Sig Dispense Refill  . Accu-Chek Softclix Lancets lancets Use as instructed 100 each 12  . Blood Glucose Monitoring Suppl (ACCU-CHEK GUIDE) w/Device KIT 1 Device by Does not apply route as needed. 1 kit 0  . dolutegravir (TIVICAY) 50 MG tablet Take 1 tablet (50 mg total) by mouth daily. 30 tablet 5  . emtricitabine-tenofovir (TRUVADA) 200-300 MG tablet Take 1 tablet by mouth daily. 30 tablet 5  . glucose blood test strip Use as instructed 100 each 12  . Prenatal Vit-Fe Fumarate-FA (MULTIVITAMIN-PRENATAL) 27-0.8 MG TABS tablet Take 1 tablet by mouth daily at 12 noon.     No facility-administered medications prior to visit.     Past Medical History:  Diagnosis Date  . Anemia   . Blood transfusion without reported diagnosis   . Fibroid   . GERD (gastroesophageal reflux disease)   . HIV (human immunodeficiency  virus infection) [facility])       Past Surgical History:  Procedure Laterality Date  . APPENDECTOMY     18 years ago  . APPENDECTOMY    . CESAREAN SECTION  2012  . CESAREAN SECTION        Family History  Problem Relation Age of Onset  . Hypertension Mother   . Diabetes Mother   . Thyroid disease Mother   . Hypertension Father   . Diabetes Father        Social History   Socioeconomic History  . Marital status: Married    Spouse name: Not on file  . Number of children: Not on file  . Years of education: Not on file  . Highest education level: Not on file  Occupational History  . Not on file  Tobacco Use  . Smoking status: Never Smoker  . Smokeless tobacco: Never Used  Vaping Use  . Vaping Use: Never used  Substance and Sexual Activity  . Alcohol use: Never    Comment: occasionally  . Drug use: Never  . Sexual activity: Yes    Birth control/protection: None  Other Topics Concern  . Not on file  Social History Narrative   ** Merged History Encounter **       Social Determinants of Health   Financial Resource Strain: Not on file  Food Insecurity: Food Insecurity Present  . Worried About Charity fundraiser in the Last Year: Sometimes true  . Ran Out of Food in the Last Year: Sometimes true  Transportation Needs: No Transportation Needs  . Lack of Transportation (Medical): No  . Lack of Transportation (Non-Medical): No  Physical Activity: Not on file  Stress: Not on file  Social Connections: Not on file  Intimate Partner Violence: Not on file           Objective:    BP 117/81   Pulse 72   Temp (!) 97.5 F (36.4 C) (Oral)   Ht 4' 11" (1.499 m)   Wt 211 lb (95.7 kg)   LMP 07/24/2019   SpO2 99%   BMI 42.62 kg/m  Nursing note and vital signs reviewed.  Physical Exam Gravid, obese, no distress, pleasant HEENT: normocephalic, per, eomi, conj clear Neck supple Lymph no cervical, axillary, inguinal adenopathy Skin no rash cv rrr no mrg Lungs clear, normal respiratory effort abd s/nt; gravid Ext no edema Neuro nonfocal Psych alert/oriented  msk no cva tenderness      Labs: Reviewed 02/29/20 cr 0.7; lft 57/40/124/0.3  02/15/20 2hr glucose test --> 225 --> 192 (fasting 107)  Hiv                cd4 (%)      /    hiv vl 02/14                                /    <20 12/28               340  (32)    /     32k  Serology: 12/18/19 urine gc/chlam and wetmount negative 10/28 quant gold negative; hep b/c negative; hep a reactive; rpr nonreactive 10/28 hla [id number] negative 10/28 hiv genotype no phenotypic resistance    Imaging: None   Assessment & Plan:   Patient Active Problem List   Diagnosis Date Noted  . Gestational diabetes 02/19/2020  . Supervision of high risk pregnancy,  antepartum 11/13/2019  . AMA (advanced maternal age) multigravida 35+ 11/13/2019  . Language barrier 11/13/2019  . HIV disease affecting pregnancy 11/13/2019  . History of C-section 11/13/2019     Problem List Items Addressed This Visit   None   Visit Diagnoses    Gravida 2, currently pregnant    -  Primary   Relevant Orders   T-helper cell (CD4)- ([facility] clinic only)   HIV-1 RNA quant-no reflex-bld   CBC   Comprehensive metabolic panel   HIV disease ([place])       Relevant Orders   T-helper cell (CD4)- ([facility] clinic only)   HIV-1 RNA quant-no reflex-bld   CBC   Comprehensive metabolic panel     #hiv dx'ed during pregnancy. Suspect transmission from husband. No other risk factors As of this visit she is not sure of her husband's hiv status. I have advised her to have an open discussion with her husband to get retested. 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 No evidence IRIS. Baseline cd4 prior to med 340 (32%)  -dicussed with our nursing staff to get her information and husband's information to be referred to rcid -continue tivicay/truvada (post-partum plan to transition to biktarvy) -discussed u=u -f/u 3 months preclinic labs (post pregnancy); advise to get viral load tested within 1 month prior to delivery  #gravid -- estimated delivery 05/2020 #vaginal bleed  Subchorionic hematoma dx'ed. resolved -f/u obstetrician -viral load testing 1 month prior to  delivery by obgyn team  #hcm -vaccination prevnar 10/28 and pneumovax in 8 weeks Will review meningococcal vaccine and tdap -hepatitis heplisav started 10/28 -annual 10/2019 quant gold negative; rpr negative -woman's health Patient will continue to have routine care with her obstetrician      Follow-up: Return in about 3 months (around 06/04/2020).  I spent more than 20 minute reviewing data/chart, and coordinating care and >50% direct face to face time providing counseling/discussing diagnostics/treatment plan with patient     Jabier Mutton, [facility] -- -- pager   [phone] cell 03/07/2020, 9:11 AM

## 2020-03-08 ENCOUNTER — Other Ambulatory Visit: Payer: Medicaid Other

## 2020-03-14 ENCOUNTER — Other Ambulatory Visit: Payer: Medicaid Other

## 2020-03-14 ENCOUNTER — Other Ambulatory Visit: Payer: Self-pay | Admitting: *Deleted

## 2020-03-14 ENCOUNTER — Other Ambulatory Visit: Payer: Self-pay

## 2020-03-14 ENCOUNTER — Telehealth (INDEPENDENT_AMBULATORY_CARE_PROVIDER_SITE_OTHER): Payer: Medicaid Other | Admitting: Obstetrics & Gynecology

## 2020-03-14 DIAGNOSIS — O09523 Supervision of elderly multigravida, third trimester: Secondary | ICD-10-CM | POA: Diagnosis not present

## 2020-03-14 DIAGNOSIS — Z3A30 30 weeks gestation of pregnancy: Secondary | ICD-10-CM

## 2020-03-14 DIAGNOSIS — O09529 Supervision of elderly multigravida, unspecified trimester: Secondary | ICD-10-CM

## 2020-03-14 DIAGNOSIS — L299 Pruritus, unspecified: Secondary | ICD-10-CM

## 2020-03-14 DIAGNOSIS — O0990 Supervision of high risk pregnancy, unspecified, unspecified trimester: Secondary | ICD-10-CM

## 2020-03-14 DIAGNOSIS — O98713 Human immunodeficiency virus [HIV] disease complicating pregnancy, third trimester: Secondary | ICD-10-CM

## 2020-03-14 DIAGNOSIS — Z21 Asymptomatic human immunodeficiency virus [HIV] infection status: Secondary | ICD-10-CM

## 2020-03-14 DIAGNOSIS — Z98891 History of uterine scar from previous surgery: Secondary | ICD-10-CM

## 2020-03-14 DIAGNOSIS — O24410 Gestational diabetes mellitus in pregnancy, diet controlled: Secondary | ICD-10-CM | POA: Diagnosis not present

## 2020-03-14 DIAGNOSIS — O34219 Maternal care for unspecified type scar from previous cesarean delivery: Secondary | ICD-10-CM | POA: Diagnosis not present

## 2020-03-14 DIAGNOSIS — Z789 Other specified health status: Secondary | ICD-10-CM | POA: Diagnosis not present

## 2020-03-14 MED ORDER — METFORMIN HCL 500 MG PO TABS: 500.0000 mg | ORAL_TABLET | Freq: Every day | ORAL | 2 refills | Status: DC

## 2020-03-14 NOTE — Progress Notes (Signed)
    TELEHEALTH OBSTETRICS VISIT ENCOUNTER NOTE  Provider location: [facility] [employer] [facility]   I connected with Miranda George on 03/14/20 at  8:15 AM EST by telephone at home and verified that I am speaking with the correct person using two identifiers. Of note, unable to do video encounter due to technical difficulties.    I discussed the limitations, risks, security and privacy concerns of performing an evaluation and management service by telephone and the availability of in person appointments. I also discussed with the patient that there may be a patient responsible charge related to this service. The patient expressed understanding and agreed to proceed.  Subjective:  Miranda George is a 40 y.o. [phone] at [redacted]w[redacted]d being followed for ongoing prenatal care.  She is currently monitored for the following issues for this high-risk pregnancy and has Supervision of high risk pregnancy, antepartum; AMA (advanced maternal age) multigravida 32+; Language barrier; HIV disease affecting pregnancy; History of C-section; and Gestational diabetes on their problem list.  Patient reports no complaints. Reports fetal movement. Denies any contractions, bleeding or leaking of fluid.   The following portions of the patient's history were reviewed and updated as appropriate: allergies, current medications, past family history, past medical history, past social history, past surgical history and problem list.   Objective:  Last menstrual period 07/24/2019. General:  Alert, oriented and cooperative.   Mental Status: Normal mood and affect perceived. Normal judgment and thought content.  Rest of physical exam deferred due to type of encounter  Assessment and Plan:  Pregnancy: [id number] at [redacted]w[redacted]d 1. Diet controlled gestational diabetes mellitus (GDM) in third trimester FBS >100, PP 110-130 - metFORMIN (GLUCOPHAGE) 500 MG tablet; Take 1 tablet (500 mg total) by mouth  at bedtime.  Dispense: 30 tablet; Refill: 2  2. History of C-section Needs consent VBAC vs. RCS  3. HIV disease affecting pregnancy in third trimester Low viral load  4. Supervision of high risk pregnancy, antepartum C/o body itching, possible cholestasis - Bile acids, total  5. Antepartum multigravida of advanced maternal age F/u US is ordered  6. Language barrier Spanish interpreter Raquel  Preterm labor symptoms and general obstetric precautions including but not limited to vaginal bleeding, contractions, leaking of fluid and fetal movement were reviewed in detail with the patient.  I discussed the assessment and treatment plan with the patient. The patient was provided an opportunity to ask questions and all were answered. The patient agreed with the plan and demonstrated an understanding of the instructions. The patient was advised to call back or seek an in-person office evaluation/go to MAU at [facility] for any urgent or concerning symptoms. Please refer to After Visit Summary for other counseling recommendations.   I provided 15 minutes of non-face-to-face time during this encounter.  Return in about 2 weeks (around 03/28/2020) for All visits need to be in person, was unable to do virtual.  Future Appointments  Date Time Provider [place]  04/01/2020 10:45 AM [facility] NURSE [facility]  04/01/2020 11:00 AM [facility] US1 [facility]  04/08/2020 10:30 AM [facility] NURSE [facility]  04/08/2020 10:45 AM [facility] US4 [facility]  06/06/2020  9:30 AM [facility]  06/20/2020  9:30 AM Vu, Rockey Situ, MD [facility]    Emeterio Reeve, MD [facility] [place] [place]

## 2020-03-14 NOTE — Progress Notes (Signed)
Called patient in order to connect with her also sent text message with invite in order to start the virtual visit. Was as not able to connect with patient so decided to do a phone visit.  Patient does not have a blood pressure cuff to record blood pressure at home  I connected with  Mayford Knife on 03/14/20 at  8:15 AM EST by telephone and verified that I am speaking with the correct person using two identifiers.   I discussed the limitations, risks, security and privacy concerns of performing an evaluation and management service by telephone and the availability of in person appointments. I also discussed with the patient that there may be a patient responsible charge related to this service. The patient expressed understanding and agreed to proceed. Call lasted about 10 minutes  Mena Goes, CMA 03/14/2020  8:40 AM

## 2020-03-16 ENCOUNTER — Other Ambulatory Visit: Payer: Self-pay | Admitting: Obstetrics & Gynecology

## 2020-03-16 ENCOUNTER — Telehealth: Payer: Self-pay | Admitting: *Deleted

## 2020-03-16 DIAGNOSIS — O26619 Liver and biliary tract disorders in pregnancy, unspecified trimester: Secondary | ICD-10-CM

## 2020-03-16 DIAGNOSIS — K831 Obstruction of bile duct: Secondary | ICD-10-CM | POA: Insufficient documentation

## 2020-03-16 LAB — BILE ACIDS, TOTAL: Bile Acids Total: 61.2 umol/L (ref 0.0–10.0)

## 2020-03-16 MED ORDER — URSODIOL 300 MG PO CAPS: 300.0000 mg | ORAL_CAPSULE | Freq: Two times a day (BID) | ORAL | 2 refills | Status: DC

## 2020-03-16 NOTE — Telephone Encounter (Addendum)
-----   Message from Woodroe Mode, MD sent at 03/16/2020  1:17 PM EST ----- Elevated labs c/w cholestasis. She will need to start Actigall, Rx sent, and fetal testing with BPP weekly.   3/2  1335 Called pt w/interpreter [employer]. She did not answer and a message was left stating that I am calling with test results. Attempt will be made later today to reach pt.    3/2 1630  Spoke with pt and informed her of abnormal lab result. She will need to be seen in office weekly for testing on the baby. Pt agreed to appt tomorrow @ 0915. Pt was also informed of need for new medication. This will be discussed in more detail tomorrow during her appt. She voiced understanding of all information and instructions given.

## 2020-03-16 NOTE — Progress Notes (Signed)
Elevated labs c/w cholestasis. She will need to start Actigall, Rx sent, and fetal testing with BPP weekly.

## 2020-03-17 ENCOUNTER — Other Ambulatory Visit: Payer: Medicaid Other

## 2020-03-17 ENCOUNTER — Ambulatory Visit (INDEPENDENT_AMBULATORY_CARE_PROVIDER_SITE_OTHER): Payer: Medicaid Other | Admitting: *Deleted

## 2020-03-17 ENCOUNTER — Ambulatory Visit: Payer: Self-pay

## 2020-03-17 VITALS — BP 115/80 | HR 76 | Wt 211.0 lb

## 2020-03-17 DIAGNOSIS — O26619 Liver and biliary tract disorders in pregnancy, unspecified trimester: Secondary | ICD-10-CM | POA: Diagnosis not present

## 2020-03-17 DIAGNOSIS — K831 Obstruction of bile duct: Secondary | ICD-10-CM | POA: Diagnosis not present

## 2020-03-17 MED ORDER — URSODIOL 500 MG PO TABS: 500.0000 mg | ORAL_TABLET | Freq: Two times a day (BID) | ORAL | 2 refills | Status: DC

## 2020-03-17 NOTE — Progress Notes (Signed)
Interpreter Miranda George present for encounter. Pt was informed of plan of care to include weekly visits for fetal testing due to abnormal lab result indicating Cholestasis. She should pick up a new prescription @ her pharmacy later today. Pt voiced understanding of information given.   Pt informed that the ultrasound is considered a limited OB ultrasound and is not intended to be a complete ultrasound exam.  Patient also informed that the ultrasound is not being completed with the intent of assessing for fetal or placental anomalies or any pelvic abnormalities.  Explained that the purpose of today's ultrasound is to assess for presentation, BPP and amniotic fluid volume.  Patient acknowledges the purpose of the exam and the limitations of the study.

## 2020-03-18 ENCOUNTER — Inpatient Hospital Stay (HOSPITAL_COMMUNITY)
Admission: AD | Admit: 2020-03-18 | Discharge: 2020-03-18 | Disposition: A | Discharge status: Home / Self Care | Payer: Medicaid Other | Attending: Obstetrics and Gynecology | Admitting: Obstetrics and Gynecology

## 2020-03-18 ENCOUNTER — Encounter (HOSPITAL_COMMUNITY): Payer: Self-pay | Admitting: Obstetrics and Gynecology

## 2020-03-18 ENCOUNTER — Other Ambulatory Visit: Payer: Self-pay

## 2020-03-18 DIAGNOSIS — Z21 Asymptomatic human immunodeficiency virus [HIV] infection status: Secondary | ICD-10-CM | POA: Diagnosis not present

## 2020-03-18 DIAGNOSIS — R109 Unspecified abdominal pain: Secondary | ICD-10-CM | POA: Insufficient documentation

## 2020-03-18 DIAGNOSIS — O26893 Other specified pregnancy related conditions, third trimester: Secondary | ICD-10-CM | POA: Diagnosis not present

## 2020-03-18 DIAGNOSIS — N898 Other specified noninflammatory disorders of vagina: Secondary | ICD-10-CM | POA: Diagnosis not present

## 2020-03-18 DIAGNOSIS — O98713 Human immunodeficiency virus [HIV] disease complicating pregnancy, third trimester: Secondary | ICD-10-CM | POA: Insufficient documentation

## 2020-03-18 DIAGNOSIS — Z3A30 30 weeks gestation of pregnancy: Secondary | ICD-10-CM | POA: Diagnosis not present

## 2020-03-18 DIAGNOSIS — O26899 Other specified pregnancy related conditions, unspecified trimester: Secondary | ICD-10-CM

## 2020-03-18 LAB — URINALYSIS, ROUTINE W REFLEX MICROSCOPIC
Bilirubin Urine: NEGATIVE
Glucose, UA: 50 mg/dL — AB
Ketones, ur: 5 mg/dL — AB
Leukocytes,Ua: NEGATIVE
Nitrite: NEGATIVE
Protein, ur: NEGATIVE mg/dL
Specific Gravity, Urine: 1.023 (ref 1.005–1.030)
pH: 6 (ref 5.0–8.0)

## 2020-03-18 LAB — WET PREP, GENITAL
Sperm: NONE SEEN
Trich, Wet Prep: NONE SEEN
Yeast Wet Prep HPF POC: NONE SEEN

## 2020-03-18 MED ORDER — METRONIDAZOLE 500 MG PO TABS: 500.0000 mg | ORAL_TABLET | Freq: Two times a day (BID) | ORAL | 0 refills | Status: DC

## 2020-03-18 NOTE — Discharge Instructions (Signed)
Vaginosis bacteriana Bacterial Vaginosis  La vaginosis bacteriana es una infeccin de la vagina. Se produce cuando crece una cantidad excesiva de grmenes normales (bacterias sanas) en la vagina. Esta infeccin puede facilitar contraer otras infecciones por las relaciones sexuales (infecciones de transmisin sexual, ITS). Es muy importante que las mujeres embarazadas reciban tratamiento. Esta infeccin puede hacer que los bebs nazcan antes de tiempo o tengan un bajo peso al nacer. Cules son las causas? La causa de esta infeccin es el aumento de ciertas bacterias que crecen en la vagina. No se puede contraer esta infeccin en los asientos de inodoros, en las sbanas, en las piscinas ni en los objetos que entran en contacto con la vagina. Qu incrementa el riesgo?  Tener relaciones sexuales con una persona nueva o con ms de Arts administrator.  Tener relaciones sexuales sin proteccin.  Hacerse duchas vaginales.  Tener colocado un dispositivo intrauterino(DIU).  Fumar.  Consumir drogas o beber alcohol. Esto puede llevarla a hacer cosas que son [place].  Tomar ciertos antibiticos.  Estar embarazada. Cules son los signos o sntomas? Algunas mujeres no tienen sntomas. Entre los sntomas, se pueden incluir los siguientes:  Secrecin de la vagina. Puede ser gris o blanca. Puede ser acuosa o espumosa.  Olor a pescado. Esto puede ocurrir despus de Office manager sexuales o durante el perodo menstrual.  Picazn en la vagina y a su alrededor.  Sensacin de ardor o dolor al hacer pis (orinar). Cmo se trata? Esta infeccin se trata con antibiticos. Estos pueden administrarse en las siguientes presentaciones:  [place].  Crema para la vagina.  Medicamento que se coloca dentro de la vagina (vulo vaginal). Si la infeccin reaparece despus del tratamiento, es posible que necesite ms antibiticos. Siga estas instrucciones en su casa: Medicamentos  Use los medicamentos de  venta libre y los recetados como se lo haya indicado el [place] o use el antibitico como se lo haya indicado el mdico. No deje de tomarlo o usarlo aunque comience a sentirse mejor. Instrucciones generales  Si la persona con la que tiene relaciones sexuales es Onycha, dgale que usted tiene esta infeccin. Ella tendr que concurrir a visitas de control con el mdico. Si tiene una pareja sexual hombre, l no necesita tratamiento.  No mantenga relaciones sexuales Database administrator.  Beba suficiente lquido como para Theatre manager la orina de color amarillo plido.  Mantenga la vagina y el ano limpios. ? Lave la zona con agua tibia cada da. ? Cuando vaya al bao, higiencese de adelante hacia atrs.  Si est amamantando a un beb, pregunte al mdico si debera seguir Camera operator.  Cumpla con todas las visitas de seguimiento. Cmo se previene? Autocuidado  No se haga duchas vaginales.  Use nicamente agua tibia para lavar la zona alrededor de la vagina.  Use ropa interior de algodn o cuya parte de adentro sea de algodn.  No use pantalones ajustados ni pantis; en especial, durante el verano. Sexo seguro  Use proteccin al [employer]. Esto puede comprender lo siguiente: ? Use preservativos. ? Use barreras bucales. Estas son capas delgadas de ltex que protegen la boca durante el sexo oral.  Limite la cantidad de personas con las que tiene Armed forces operational officer. Para prevenir esta infeccin, lo mejor es tener relaciones sexuales con una sola persona.  Hgase exmenes de ITS. Tambin debe [employer] la persona con quien tiene Armed forces operational officer. Drogas y alcohol  No fume ni consuma ningn producto que contenga nicotina o  tabaco. Si necesita ayuda para dejar de consumir estos productos, consulte al mdico.  No consuma drogas.  No beba alcohol si: ? El mdico le indica que no lo haga. ? Est embarazada, puede estar  embarazada o est tratando de quedar embarazada.  Si bebe alcohol: ? Limite la cantidad que consume de 0 a 1 medida por da. ? Sepa cunta cantidad de alcohol hay en las bebidas que toma. En los [place], una medida equivale a una botella de cerveza de 12oz (34ml), un vaso de vino de 5oz (156ml) o un vaso de una bebida alcohlica de alta graduacin de 1oz (90ml). Dnde buscar ms informacin  Centers for Disease Control and Prevention (Centros para el Control y la Prevencin de Arboriculturist): http://www.wolf.info/  American Sexual Health Association (Asociacin Estadounidense de la Salud Sexual): www.ashastd.org  Office on [employer] ([place]): LegalWarrants.gl Comunquese con un mdico si:  Los sntomas no mejoran, incluso despus de Chiropodist.  Tiene ms secrecin o siente dolor al hacer pis.  Tiene fiebre o escalofros.  Tiene dolor en el vientre (abdomen) o en la zona que se encuentra entre las caderas.  Siente dolor durante el sexo.  Le sangra la vagina entre perodos menstruales. Resumen  Esta infeccin puede ocurrir cuando crecen demasiados grmenes (bacterias) en la vagina.  Esta infeccin puede facilitar contraer infecciones por las relaciones sexuales (infecciones de transmisin sexual, ITS). El tratamiento puede disminuir esa posibilidad.  Reciba tratamiento si est embarazada. Esta infeccin puede hacer que los bebs nazcan antes de [place].  No deje de tomar ni de usar el antibitico aunque comience a sentirse mejor. Esta informacin no tiene Marine scientist el consejo del mdico. Asegrese de hacerle al mdico cualquier pregunta que tenga. Document Revised: 08/05/2019 Document Reviewed: 08/05/2019 Elsevier Patient Education  [place].

## 2020-03-18 NOTE — MAU Note (Signed)
Pt stated she started noticing brown discharge yesterday. Still having discharge and noticed some small clots. reports some mild occasional cramping and "burning in her vaginal area. Reports decreased fetal movement today as well

## 2020-03-18 NOTE — MAU Provider Note (Signed)
Chief Complaint:  Vaginal Discharge   Event Date/Time   First Provider Initiated Contact with Patient 03/18/20 1854     HPI: Miranda George is a 40 y.o. G3P1011 at 2w4dwho presents to maternity admissions reporting vaginal irritation with brown discharge and odor and abdominal pain. No recent IC (no sex since she got pregnant). Also feeling decreased fetal movement today, is feeling more now that she is in MAU on the monitor.  P[facility] Course: [facility] during this pregnancy  Past Medical History:  Diagnosis Date  . Anemia   . Blood transfusion without reported diagnosis   . Fibroid   . GERD (gastroesophageal reflux disease)   . HIV (human immunodeficiency virus infection) ([facility]    OB History  Gravida Para Term Preterm AB Living  '3 1 1 ' 0 1 1  SAB IAB Ectopic Multiple Live Births  1 0 0   1    # Outcome Date GA Lbr Len/2nd Weight Sex Delivery Anes PTL Lv  3 Current           2 Term 09/28/10 459w0d9 lb (4.082 kg) M CS-Unspec EPI  LIV     Birth Comments: c/s Arrest of Descent; postpartum hemorrhage, IOL due to size; blood transfusion     Complications: Failure to Progress in Second Stage  1 SAB 2002           Past Surgical History:  Procedure Laterality Date  . APPENDECTOMY     18 years ago  . APPENDECTOMY    . CESAREAN SECTION  2012  . CESAREAN SECTION     Family History  Problem Relation Age of Onset  . Hypertension Mother   . Diabetes Mother   . Thyroid disease Mother   . Hypertension Father   . Diabetes Father    Social History   Tobacco Use  . Smoking status: Never Smoker  . Smokeless tobacco: Never Used  Vaping Use  . Vaping Use: Never used  Substance Use Topics  . Alcohol use: Never    Comment: occasionally  . Drug use: Never   No Known Allergies Medications Prior to Admission  Medication Sig Dispense Refill Last Dose  . dolutegravir (TIVICAY) 50 MG tablet Take 1 tablet (50 mg total) by mouth daily. 30  tablet 5 03/18/2020 at Unknown time  . emtricitabine-tenofovir (TRUVADA) 200-300 MG tablet Take 1 tablet by mouth daily. 30 tablet 5 03/18/2020 at Unknown time  . metFORMIN (GLUCOPHAGE) 500 MG tablet Take 1 tablet (500 mg total) by mouth at bedtime. 30 tablet 2 03/18/2020 at Unknown time  . Prenatal Vit-Fe Fumarate-FA (MULTIVITAMIN-PRENATAL) 27-0.8 MG TABS tablet Take 1 tablet by mouth daily at 12 noon.   03/18/2020 at Unknown time  . Accu-Chek Softclix Lancets lancets Use as instructed 100 each 12   . Blood Glucose Monitoring Suppl (ACCU-CHEK GUIDE) w/Device KIT 1 Device by Does not apply route as needed. 1 kit 0   . glucose blood test strip Use as instructed 100 each 12   . ursodiol (ACTIGALL) 500 MG tablet Take 1 tablet (500 mg total) by mouth in the morning and at bedtime. 60 tablet 2    I have reviewed patient's Past Medical Hx, Surgical Hx, Family Hx, Social Hx, medications and allergies.   ROS:  Review of Systems  Constitutional: Negative for fatigue and fever.  HENT: Negative for congestion and sore throat.   Eyes: Negative for visual disturbance.  Respiratory: Negative for cough  and shortness of breath.   Gastrointestinal: Positive for abdominal pain. Negative for nausea and vomiting.  Genitourinary: Positive for vaginal bleeding (discharge is brown) and vaginal discharge.  Neurological: Negative for dizziness, syncope, light-headedness and headaches.   Physical Exam   Patient Vitals for the past 24 hrs:  BP Temp Pulse Resp Height Weight  03/18/20 1806 136/75 97.8 F (36.6 C) 75 18 '4\' 11"'  (1.499 m) 213 lb (96.6 kg)   Constitutional: Well-developed, well-nourished female in no acute distress.  Cardiovascular: normal rate & rhythm, no murmur Respiratory: normal effort, lung sounds clear throughout GI: Abd soft, non-tender, gravid appropriate for gestational age. Pos BS x 4 MS: Extremities nontender, no edema, normal ROM Neurologic: Alert and oriented x 4.  GU: no CVA  tenderness Pelvic: NEFG, brown discharge, cervix closed  Fetal Tracing: reactive Baseline: 150 Variability: moderate Accelerations: 15x15 Decelerations: one variable  Toco: relaxed   Labs: Results for orders placed or performed during the hospital encounter of 03/18/20 (from the past 24 hour(s))  Urinalysis, Routine w reflex microscopic Urine, Clean Catch     Status: Abnormal   Collection Time: 03/18/20  6:42 PM  Result Value Ref Range   Color, Urine YELLOW YELLOW   APPearance CLOUDY (A) CLEAR   Specific Gravity, Urine 1.023 1.005 - 1.030   pH 6.0 5.0 - 8.0   Glucose, UA 50 (A) NEGATIVE mg/dL   Hgb urine dipstick LARGE (A) NEGATIVE   Bilirubin Urine NEGATIVE NEGATIVE   Ketones, ur 5 (A) NEGATIVE mg/dL   Protein, ur NEGATIVE NEGATIVE mg/dL   Nitrite NEGATIVE NEGATIVE   Leukocytes,Ua NEGATIVE NEGATIVE   RBC / HPF 0-5 0 - 5 RBC/hpf   WBC, UA 0-5 0 - 5 WBC/hpf   Bacteria, UA RARE (A) NONE SEEN   Squamous Epithelial / LPF 11-20 0 - 5   Mucus PRESENT   Wet prep, genital     Status: Abnormal   Collection Time: 03/18/20  6:56 PM   Specimen: PATH Cytology Cervicovaginal Ancillary Only  Result Value Ref Range   Yeast Wet Prep HPF POC NONE SEEN NONE SEEN   Trich, Wet Prep NONE SEEN NONE SEEN   Clue Cells Wet Prep HPF POC PRESENT (A) NONE SEEN   WBC, Wet Prep HPF POC FEW (A) NONE SEEN   Sperm NONE SEEN    Imaging:  No results found.  MAU Course: Orders Placed This Encounter  Procedures  . Wet prep, genital  . Urinalysis, Routine w reflex microscopic Urine, Clean Catch  . Discharge patient   Meds ordered this encounter  Medications  . metroNIDAZOLE (FLAGYL) 500 MG tablet    Sig: Take 1 tablet (500 mg total) by mouth 2 (two) times daily.    Dispense:  14 tablet    Refill:  0    Order Specific Question:   Supervising Provider    Answer:   Merrily Pew   MDM: Wet prep positive for clue cells, with discharge, vaginal irritation and odor indicates BV. Explained  findings to pt and sent prescription for flagyl to pharmacy  Interpreter Eyvonne Mechanic) present for visits with patient.  Assessment: 1. Vaginal discharge during pregnancy in third trimester   2. Vaginal irritation   3. Abdominal pain affecting pregnancy    Plan: Discharge home in stable condition with bleeding and 3rd trimester precautions    [place] [facility]. Go to.   Specialty: Obstetrics and Gynecology Why: as  scheduled for ongoing prenatal care Contact information: [address] [place] [address] [phone]              Allergies as of 03/18/2020   No Known Allergies     Medication List    TAKE these medications   Accu-Chek Guide w/Device Kit 1 Device by Does not apply route as needed.   Accu-Chek Softclix Lancets lancets Use as instructed   emtricitabine-tenofovir 200-300 MG tablet Commonly known as: TRUVADA Take 1 tablet by mouth daily.   glucose blood test strip Use as instructed   metFORMIN 500 MG tablet Commonly known as: Glucophage Take 1 tablet (500 mg total) by mouth at bedtime.   metroNIDAZOLE 500 MG tablet Commonly known as: FLAGYL Take 1 tablet (500 mg total) by mouth 2 (two) times daily.   multivitamin-prenatal 27-0.8 MG Tabs tablet Take 1 tablet by mouth daily at 12 noon.   Tivicay 50 MG tablet Generic drug: dolutegravir Take 1 tablet (50 mg total) by mouth daily.   ursodiol 500 MG tablet Commonly known as: ACTIGALL Take 1 tablet (500 mg total) by mouth in the morning and at bedtime.      Gaylan Gerold, CNM, MSN, [place] Certified Nurse Midwife, [facility]

## 2020-03-21 LAB — GC/CHLAMYDIA PROBE AMP (~~LOC~~) NOT AT ARMC
Chlamydia: NEGATIVE
Comment: NEGATIVE
Comment: NORMAL
Neisseria Gonorrhea: NEGATIVE

## 2020-03-24 ENCOUNTER — Ambulatory Visit (INDEPENDENT_AMBULATORY_CARE_PROVIDER_SITE_OTHER): Payer: Medicaid Other | Admitting: Obstetrics and Gynecology

## 2020-03-24 ENCOUNTER — Other Ambulatory Visit: Payer: Self-pay

## 2020-03-24 ENCOUNTER — Ambulatory Visit: Payer: Medicaid Other | Admitting: Registered"

## 2020-03-24 ENCOUNTER — Ambulatory Visit: Payer: Self-pay

## 2020-03-24 ENCOUNTER — Encounter: Payer: Medicaid Other | Attending: Obstetrics & Gynecology | Admitting: Registered"

## 2020-03-24 ENCOUNTER — Ambulatory Visit (INDEPENDENT_AMBULATORY_CARE_PROVIDER_SITE_OTHER): Payer: Medicaid Other | Admitting: *Deleted

## 2020-03-24 VITALS — BP 134/74 | HR 68 | Wt 211.5 lb

## 2020-03-24 DIAGNOSIS — O98719 Human immunodeficiency virus [HIV] disease complicating pregnancy, unspecified trimester: Secondary | ICD-10-CM | POA: Diagnosis present

## 2020-03-24 DIAGNOSIS — O24419 Gestational diabetes mellitus in pregnancy, unspecified control: Secondary | ICD-10-CM

## 2020-03-24 DIAGNOSIS — K831 Obstruction of bile duct: Secondary | ICD-10-CM

## 2020-03-24 DIAGNOSIS — O26619 Liver and biliary tract disorders in pregnancy, unspecified trimester: Secondary | ICD-10-CM

## 2020-03-24 DIAGNOSIS — O26649 Intrahepatic cholestasis of pregnancy, unspecified trimester: Secondary | ICD-10-CM

## 2020-03-24 DIAGNOSIS — Z3A00 Weeks of gestation of pregnancy not specified: Secondary | ICD-10-CM | POA: Insufficient documentation

## 2020-03-24 DIAGNOSIS — O3663X0 Maternal care for excessive fetal growth, third trimester, not applicable or unspecified: Secondary | ICD-10-CM

## 2020-03-24 DIAGNOSIS — Z3A31 31 weeks gestation of pregnancy: Secondary | ICD-10-CM

## 2020-03-24 DIAGNOSIS — O0990 Supervision of high risk pregnancy, unspecified, unspecified trimester: Secondary | ICD-10-CM

## 2020-03-24 DIAGNOSIS — Z789 Other specified health status: Secondary | ICD-10-CM

## 2020-03-24 DIAGNOSIS — Z98891 History of uterine scar from previous surgery: Secondary | ICD-10-CM

## 2020-03-24 DIAGNOSIS — O98713 Human immunodeficiency virus [HIV] disease complicating pregnancy, third trimester: Secondary | ICD-10-CM

## 2020-03-24 DIAGNOSIS — O09523 Supervision of elderly multigravida, third trimester: Secondary | ICD-10-CM

## 2020-03-24 DIAGNOSIS — Z21 Asymptomatic human immunodeficiency virus [HIV] infection status: Secondary | ICD-10-CM | POA: Insufficient documentation

## 2020-03-24 DIAGNOSIS — Z758 Other problems related to medical facilities and other health care: Secondary | ICD-10-CM

## 2020-03-24 NOTE — Progress Notes (Signed)
Interpreter Eda Royal present for encounter.  [place] for growth and BPP scheduled on 3/18

## 2020-03-24 NOTE — Progress Notes (Signed)
Interpreter services provided by Wilhemena Durie [phone] from [place]  Patient was seen on 03/24/20 for follow-up assessment and education for Gestational Diabetes. EDD 05/23/20; [redacted]w[redacted]d. Patient states changes to diet/lifestyle including adjusting diet, eating frequently. Pt states she is not hungry at night and only eats a little bit in evening to take metformin.   Patient states it is complicated to take medications because she has to take with food or they make her sleepy. RD offered to have a RN speak with her about how to take medications, but patient states she is feels confident in how she is taking and doesn't need to consult a nurse.  Patient is testing blood glucose as directed pre breakfast and 2 hours after each meal.   The following learning objectives reviewed during follow-up visit:   Timing of taking metformin  Plan:  . If you prefer to take metformin with breakfast instead of dinner, that will be fine. Aim to take at the same meal each day   Patient instructed to monitor glucose levels: FBS: 60 - 95 mg/dl 2 hour: <120 mg/dl  Patient received the following handouts:  Blood sugar log  Patient states she would like to be seen for follow-up in 2 weeks.

## 2020-03-24 NOTE — Progress Notes (Signed)

## 2020-03-24 NOTE — Progress Notes (Signed)
Prenatal Visit Note Date: 03/24/2020 Clinic: [facility]  Subjective:  Miranda George is a 40 y.o. G3P1011 at [redacted]w[redacted]d being seen today for ongoing prenatal care.  She is currently monitored for the following issues for this high-risk pregnancy and has Supervision of high risk pregnancy, antepartum; AMA (advanced maternal age) multigravida 32+; Language barrier; HIV disease affecting pregnancy; History of C-section; Gestational diabetes; Cholestasis of pregnancy, antepartum; and Excessive fetal growth affecting management of mother in third trimester, antepartum on their problem list.  Patient reports no complaints.   Contractions: Irregular. Vag. Bleeding: None.  Movement: Present. Denies leaking of fluid.   The following portions of the patient's history were reviewed and updated as appropriate: allergies, current medications, past family history, past medical history, past social history, past surgical history and problem list. Problem list updated.  Objective:   Vitals:   03/24/20 1328  BP: 134/74  Pulse: 68  Weight: 211 lb 8 oz (95.9 kg)    Fetal Status: Fetal Heart Rate (bpm): NST   Movement: Present     General:  Alert, oriented and cooperative. Patient is in no acute distress.  Skin: Skin is warm and dry. No rash noted.   Cardiovascular: Normal heart rate noted  Respiratory: Normal respiratory effort, no problems with respiration noted  Abdomen: Soft, gravid, appropriate for gestational age. Pain/Pressure: Present     Pelvic:  Cervical exam deferred        Extremities: Normal range of motion.  Edema: None  Mental Status: Normal mood and affect. Normal behavior. Normal judgment and thought content.   Urinalysis:      Assessment and Plan:  Pregnancy: G3P1011 at [redacted]w[redacted]d  1. Supervision of high risk pregnancy, antepartum Routine care. No BTL  2. Cholestasis of pregnancy, antepartum bpp 10/10 today, continue qwk bpps and has rpt growth on 3/18.  Continue actigall. D/w her re: 37wk delivery. See below  3. Gestational diabetes mellitus (GDM), antepartum, gestational diabetes method of control unspecified Takes metformin 500 qhs with good control. See above  4. HIV disease affecting pregnancy in third trimester VL undetectable on 2/14. Followed by ID. Continue truvada and tivicay.   29. Multigravida of advanced maternal age in third trimester No issues  6. History of C-section Prior baby for failure to progress at 7 lbs. She did not have gdm in that pregnancy. R/b d/w her and she would like a rpt c/s.   email sent for 4/18 rpt c/s.  7. Language barrier Interpreter used  8. Excessive fetal growth affecting management of pregnancy in third trimester, single or unspecified fetus she has an lga baby currently (2/18: 90%, 1519gm, ac 97%)  9. [redacted] weeks gestation of pregnancy  Preterm labor symptoms and general obstetric precautions including but not limited to vaginal bleeding, contractions, leaking of fluid and fetal movement were reviewed in detail with the patient. Please refer to After Visit Summary for other counseling recommendations.  Return in about 8 days (around 04/01/2020) for in person, md visit; Also needs HOB and NST/BPP on 3/24 @ 1315, 1415, DM @ 1515.   Aletha Halim, MD

## 2020-03-26 ENCOUNTER — Other Ambulatory Visit: Payer: Self-pay | Admitting: Obstetrics and Gynecology

## 2020-03-28 ENCOUNTER — Other Ambulatory Visit: Payer: Self-pay

## 2020-03-28 ENCOUNTER — Encounter: Payer: Medicaid Other | Admitting: Obstetrics and Gynecology

## 2020-03-31 ENCOUNTER — Other Ambulatory Visit: Payer: Self-pay

## 2020-03-31 ENCOUNTER — Ambulatory Visit (INDEPENDENT_AMBULATORY_CARE_PROVIDER_SITE_OTHER): Payer: Medicaid Other | Admitting: Obstetrics and Gynecology

## 2020-03-31 VITALS — BP 126/79 | HR 75 | Wt 213.2 lb

## 2020-03-31 DIAGNOSIS — O0990 Supervision of high risk pregnancy, unspecified, unspecified trimester: Secondary | ICD-10-CM

## 2020-03-31 DIAGNOSIS — Z98891 History of uterine scar from previous surgery: Secondary | ICD-10-CM

## 2020-03-31 DIAGNOSIS — Z789 Other specified health status: Secondary | ICD-10-CM

## 2020-03-31 DIAGNOSIS — O98719 Human immunodeficiency virus [HIV] disease complicating pregnancy, unspecified trimester: Secondary | ICD-10-CM

## 2020-03-31 DIAGNOSIS — O26619 Liver and biliary tract disorders in pregnancy, unspecified trimester: Secondary | ICD-10-CM

## 2020-03-31 DIAGNOSIS — O09529 Supervision of elderly multigravida, unspecified trimester: Secondary | ICD-10-CM

## 2020-03-31 DIAGNOSIS — O24415 Gestational diabetes mellitus in pregnancy, controlled by oral hypoglycemic drugs: Secondary | ICD-10-CM

## 2020-03-31 DIAGNOSIS — O3663X0 Maternal care for excessive fetal growth, third trimester, not applicable or unspecified: Secondary | ICD-10-CM

## 2020-03-31 DIAGNOSIS — K831 Obstruction of bile duct: Secondary | ICD-10-CM

## 2020-03-31 DIAGNOSIS — Z3A32 32 weeks gestation of pregnancy: Secondary | ICD-10-CM | POA: Insufficient documentation

## 2020-03-31 NOTE — Patient Instructions (Signed)
Gabbe's obstetrics: Normal and problem pregnancies (8th ed., pp. [phone].e4). Elsevier.">  Colestasis del embarazo Cholestasis of Pregnancy  La colestasis del embarazo es una afeccin que hace que la bilis, un lquido digestivo producido por el hgado, se acumule en el hgado durante el embarazo. Esto causa problemas con la funcin del hgado. La colestasis del embarazo es ms frecuente en el tercer trimestre, pero puede presentarse en cualquier momento durante la gestacin. Con frecuencia, esta afeccin desaparece pronto tras el parto. La colestasis puede ser perjudicial tanto para usted como para el beb. La colestasis puede aumentar el riesgo de lo siguiente:  Que el beb nazca antes de tiempo (parto prematuro).  Que el beb elimine el meconio o sus primeras heces (material fecal), mientras est an embarazada.  Enfermedad grave o potencialmente mortal para usted Solicitor que requiere hospitalizacin. Esto incluye la preeclampsia.  Perder al beb antes del parto (muerte fetal). Cules son las causas? Se desconoce la causa precisa de esta afeccin, pero podra estar relacionada con lo siguiente:  Hormonas del embarazo. Estas son sustancias qumicas del cuerpo que pueden ralentizar el flujo de bilis y acumularse en el hgado. La acumulacin de bilis puede causar problemas con la funcin heptica.  Cambios en los genes o mutaciones genticas. Estos cambios pueden ocurrir [employer] genes que afectan la forma en que el hgado libera bilis. Qu incrementa el riesgo? Es ms probable que sufra esta afeccin si:  Tuvo colestasis del embarazo antes.  Tiene antecedentes familiares de colestasis del embarazo.  Tiene problemas de hgado.  Tiene un embarazo mltiple, como gemelos o trillizos. Cules son los signos o sntomas? El sntoma ms frecuente es la picazn intensa (prurito), especialmente en [place] y las plantas de los pies. La picazn puede extenderse al resto  del cuerpo y suele ser peor por la noche. Otros sntomas pueden incluir:  Cansancio.  Dolor en la parte superior derecha del abdomen.  Orina de color oscuro.  Heces de color claro.  Falta de apetito.  La piel o las partes blancas de los ojos se ponen amarillas (ictericia). Cmo se diagnostica? Esta afeccin se diagnostica en funcin de lo siguiente:  Sus antecedentes mdicos.  Un examen fsico.  Anlisis de sangre. Tambin podran realizarle un estudio gentico si tiene un riesgo por herencia de desarrollar esta afeccin. Un riesgo por herencia es algo que transmite un padre. Cmo se trata? Esta afeccin se trata con mtodos para mantenerla cmoda y mantener seguro al beb. El mdico puede:  Recetarle algn medicamento para disminuir la cantidad de cido biliar que hay en el torrente sanguneo y [employer].  Darle vitaminaK antes del parto para evitar el sangrado excesivo.  Controlar al beb con frecuencia (control fetal).  Realizar anlisis de sangre de forma regular para controlar la funcin heptica y el nivel de bilis hasta que el beb nazca.  Recomendar el inicio (induccin) del [employer] de parto y el parto en la semana36 o37 del [place], o apenas los pulmones del beb alcancen el desarrollo suficiente. Siga estas instrucciones en su casa:  Use los medicamentos de venta libre y los recetados solamente como se lo haya indicado el mdico.  Consuma una dieta saludable que incluya gran cantidad de frutas y verduras.  Use ropa cmoda, suelta y de algodn para disminuir la picazn.  Tome baos de inmersin en agua fra para aliviar la picazn de la piel.  Concurra a todas las visitas prenatales y [place]. Esto  es importante. Comunquese con un mdico si:  Los sntomas empeoran a pesar del tratamiento.  Comienza a Armed forces technical officer.  Tiene hinchazn en los pies, los tobillos, las piernas o el abdomen que no es  frecuente.  Tiene fiebre.  Comienza a tener nuseas o vmitos.  Las heces son de color anormal. Solicite ayuda de inmediato si:  Comienza el trabajo de parto antes de [place].  Tiene dolor de cabeza que no se va, o tiene dolor de [place] y presenta cambios en la visin.  No puede comer o beber durante 24horas debido a nuseas o vmitos.  Siente un dolor intenso en el abdomen.  Le falta el aire.  La piel y la parte blanca de los ojos se ponen amarillos. Resumen  La colestasis del embarazo es ms frecuente en el tercer trimestre, pero puede presentarse en cualquier momento durante la gestacin.  El sntoma ms frecuente de la colestasis del embarazo es la picazn intensa (prurito), especialmente, en [place] manos y las plantas de los pies.  Esta afeccin podra tratarse con medicamentos, control frecuente del beb, hemogramas o iniciando (induciendo) el trabajo de parto y el parto en las semanas36 o37 del [place].  Concurra a todas las visitas prenatales y [place]. Esto es importante. Esta informacin no tiene Marine scientist el consejo del mdico. Asegrese de hacerle al mdico cualquier pregunta que tenga. Document Revised: 10/27/2019 Document Reviewed: 10/27/2019 Elsevier Patient Education  2021 [place]. Diabetes mellitus gestacional, cuidados personales Gestational Diabetes Mellitus, Self-Care Las mujeres que tienen diabetes mellitus gestacional deben mantener su nivel de azcar en la sangre (glucosa) dentro de lmites saludables. Cules son los riesgos? Si no recibe tratamiento, esta afeccin puede causar problemas a usted y a su beb en gestacin. Para la mam  Dar a luz al beb de manera temprana.  Tener problemas durante el [place] de parto y al dar a Actuary.  Necesitar una ciruga para dar a luz al beb (parto por cesrea).  Tener problemas con la presin arterial.  Tener esta forma de diabetes de nuevo al estar  embarazada.  Desarrollar diabetes tipo2 en el futuro. Para el beb  [employer] de Dispensing optician.  Tamao corporal ms grande que lo normal.  Problemas respiratorios. Cmo International aid/development worker su nivel de azcar en la sangre todos los das durante el [place]. Contrlelo con la frecuencia que le haya indicado el mdico. Para hacer esto: 1. Lvese las manos con agua y [place] durante al menos 20segundos. 2. Pnchese el costado del dedo (no en la punta) con la lanceta. Use un dedo diferente cada vez. 3. Frote suavemente el dedo hasta que aparezca una pequea gota de [place]. 4. Siga las instrucciones que vienen con el medidor para: ? Materials engineer. ? Poner sangre en la tira reactiva. ? Obtener el resultado. 5. Registre su resultado y las observaciones que desee. En general, sus niveles de azcar en la sangre deben ser los siguientes:  95 mg/dl (5.3 mmol/l) si no ha comido.  140 mg/dl (7.8 mmol/l) 1 hora despus de una comida.  120 mg/dl (6.7 mmol/l) 2 horas despus de una comida.   Siga estas instrucciones en su casa: Medicamentos  Use los medicamentos de venta libre y los recetados solamente como se lo haya indicado el mdico.  Si el mdico le recet insulina u otros medicamentos para la diabetes, aplquesela o tmelos todos los das: ? Aplquese  o tome los [employer]. ? No se quede sin insulina o sin otros medicamentos. Planifique con antelacin para tenerlos siempre. Comida y bebida  Siga las instrucciones del mdico respecto de las restricciones en las comidas o las bebidas.  Consulte a un experto en alimentacin (nutricionista) que le ayude a crear un plan de alimentacin para Forensic scientist. Los alimentos de este plan deben incluir lo siguiente: ? Protenas con bajo contenido de [place]. ? Frijoles secos, frutos secos, y panes, cereales o pasta integrales. ? Lambert Mody y verduras frescas. ? Productos  lcteos con bajo contenido de grasa. ? Grasas saludables.  Ingiera refrigerios saludables entre comidas nutritivas.  Beba suficiente lquido para Contractor pis (orina) de color amarillo plido.  Lleve un registro de los hidratos de carbono que consume. Para hacer esto: ? Lea las etiquetas de los alimentos. ? Aprenda cules son los tamaos de las porciones de los alimentos.  Siga su plan para los das de enfermedad cuando no pueda comer ni beber normalmente. United Auto plan con el mdico, de modo que est listo para usarlo.   Actividad  Coca [employer]a ejercicios como se lo haya indicado el mdico.  Haga actividad fsica durante 17minutos o ms por da, o durante el tiempo que el Viacom recomiende. Puede ayudar a Environmental consultant de azcar en la sangre despus de una comida si: ? Hace 10 minutos de actividad fsica despus de cada comida. ? Comienza esa actividad fsica 30 minutos despus de la comida.  Hable con el mdico antes de comenzar una rutina de ejercicio nueva. Es posible que el mdico le diga que haga cambios en la [place], otros medicamentos o los alimentos. Estilo de vida  No beba alcohol.  No consuma ningn producto que contenga nicotina o tabaco, como cigarrillos, cigarrillos electrnicos y tabaco de Higher education careers adviser. Si necesita ayuda para dejar de consumir estos productos, consulte al mdico.  Aprenda cmo sobrellevar el estrs. Si necesita ayuda para lograrlo, consulte al [employer]aco. Cuidado del cuerpo  [place] vacunas al da.  U.S. [employer]ancorp. Para hacer esto: ? Cepllese los dientes y [place]. ? Psese hilo dental una o ms veces por da. ? Vaya al dentista una vez cada 42meses o con ms frecuencia.  Mantenga un peso Tax adviser. Indicaciones generales  Pregntele al Continental Airlines riesgos de la hipertensin arterial en el embarazo.  Comparta su plan de atencin de la diabetes con: ? Sus compaeros de trabajo  o de la escuela. ? [employer] Corporation con las que [place].  Hgase pruebas de orina para Product manager presencia de cetonas: ? Cuando est enferma. ? Como se lo haya indicado el mdico.  Lleve consigo una tarjeta, o use un brazalete que diga que tiene diabetes.  Cumpla con todas las visitas de seguimiento. Cuidados despus del parto  Hgase controlar el nivel de azcar en la sangre 4 a 12semanas despus del parto.  Hgase controlar si tiene diabetes una o ms veces cada 3 aos o segn le hayan indicado. Dnde buscar ms informacin  American Diabetes Association (ADA) (Asociacin Estadounidense de la Diabetes): diabetes.org  Association of Diabetes Care & Education Specialists (ADCES) (Asociacin de Especialistas en Atencin y Educacin sobre la Diabetes): diabeteseducator.org  Centers for Disease Control and Prevention (Centros para el Control y la Prevencin de [place], CDC): StoreMirror.com.cy  American Pregnancy Association (Asociacin Americana del [place]): americanpregnancy.org  U.S. Department of [place] representative (MyPlate del [place]  de Agricultura de los EE.UU.): http://www.wilson-mendoza.org/ Comunquese con un mdico si:  Su azcar en la sangre est por encima de su valor ideal en dospruebas consecutivas.  Tiene fiebre.  Ha estado enferma durante 2 o ms das y no mejora.  Tiene cualquiera de estos problemas durante ms de 6horas: ? Vomita cada vez que come o bebe. ? Presenta heces lquidas (diarrea). Solicite ayuda de inmediato si:  No puede pensar con claridad.  Tiene dificultad para respirar.  Tiene un nivel moderado o alto de cetonas en la [place].  Le comienza a salir lquido anmalo o sangre de la vagina.  Siente que el beb no se mueve tanto como es habitual.  Comienza a tener contracciones de [place] temprana. Siente que el vientre se endurece.  Tiene un dolor de cabeza muy intenso. Estos sntomas pueden Sales executive. Solicite ayuda de inmediato. Comunquese  con el servicio de emergencias de su localidad (911 en los [place] Unidos).  No espere a ver si los sntomas desaparecen.  No conduzca por sus propios medios [employer] hospital. Resumen  Controle su azcar en la sangre (glucosa) mientras est embarazada. Contrlelo con la frecuencia que le haya indicado el mdico.  Aplquese la insulina y tome los medicamentos para la diabetes como se lo hayan indicado.  Hgase controlar el nivel de azcar en la sangre 4 a 12semanas despus del parto.  Cumpla con todas las visitas de seguimiento. Esta informacin no tiene Marine scientist el consejo del mdico. Asegrese de hacerle al mdico cualquier pregunta que tenga. Document Revised: 08/04/2019 Document Reviewed: 08/04/2019 Elsevier Patient Education  [place].

## 2020-03-31 NOTE — Progress Notes (Signed)
   PRENATAL VISIT NOTE  Subjective:  Miranda George is a 40 y.o. G3P1011 at [redacted]w[redacted]d being seen today for ongoing prenatal care.  She is currently monitored for the following issues for this high-risk pregnancy and has Supervision of high risk pregnancy, antepartum; AMA (advanced maternal age) multigravida 77+; Language barrier; HIV disease affecting pregnancy; History of C-section; Gestational diabetes; Cholestasis of pregnancy, antepartum; Excessive fetal growth affecting management of mother in third trimester, antepartum; and [redacted] weeks gestation of pregnancy on their problem list.  Patient doing well with no acute concerns today. She reports occasional contractions.  Contractions: Irregular. Vag. Bleeding: None.  Movement: Present. Denies leaking of fluid.   The following portions of the patient's history were reviewed and updated as appropriate: allergies, current medications, past family history, past medical history, past social history, past surgical history and problem list. Problem list updated.  Objective:   Vitals:   03/31/20 1534  BP: 126/79  Pulse: 75  Weight: 213 lb 3.2 oz (96.7 kg)    Fetal Status: Fetal Heart Rate (bpm): 128 Fundal Height: 33 cm Movement: Present     General:  Alert, oriented and cooperative. Patient is in no acute distress.  Skin: Skin is warm and dry. No rash noted.   Cardiovascular: Normal heart rate noted  Respiratory: Normal respiratory effort, no problems with respiration noted  Abdomen: Soft, gravid, appropriate for gestational age.  Pain/Pressure: Present     Pelvic: Cervical exam deferred        Extremities: Normal range of motion.  Edema: None  Mental Status:  Normal mood and affect. Normal behavior. Normal judgment and thought content.  FBS: 85-90 PPBS: 113-115  Assessment and Plan:  Pregnancy: G3P1011 at [redacted]w[redacted]d  1. Supervision of high risk pregnancy, antepartum   2. Antepartum multigravida of advanced maternal age   61. HIV  disease affecting pregnancy, antepartum Pt continues antivirals and is followed by ID  4. Excessive fetal growth affecting management of pregnancy in third trimester, single or unspecified fetus Pt has growth and BPP on 3/18  5. Gestational diabetes mellitus (GDM) in third trimester controlled on oral hypoglycemic drug Verbal communication of blood sugars sounds appropriate; however, pt did not bring in blood sugars  6. Cholestasis of pregnancy, antepartum Continues actigall, delivery at 37 weeks  7. History of C-section Repeat c/s  8. Language barrier Interpreter present  9. [redacted] weeks gestation of pregnancy   Preterm labor symptoms and general obstetric precautions including but not limited to vaginal bleeding, contractions, leaking of fluid and fetal movement were reviewed in detail with the patient.  Please refer to After Visit Summary for other counseling recommendations.   Return in about 2 weeks (around 04/14/2020) for [facility], in person.   Lynnda Shields, MD Faculty Attending Center for [facility]

## 2020-03-31 NOTE — Progress Notes (Signed)
C/o contractions everyday 3-4 times. Did not bring glucose log today. Reminded to bring everytime.  Alanda Colton,RN

## 2020-04-01 ENCOUNTER — Other Ambulatory Visit: Payer: Self-pay | Admitting: *Deleted

## 2020-04-01 ENCOUNTER — Ambulatory Visit: Payer: Medicaid Other | Attending: Obstetrics and Gynecology

## 2020-04-01 ENCOUNTER — Encounter: Payer: Self-pay | Admitting: *Deleted

## 2020-04-01 ENCOUNTER — Ambulatory Visit: Payer: Medicaid Other | Admitting: *Deleted

## 2020-04-01 DIAGNOSIS — O0990 Supervision of high risk pregnancy, unspecified, unspecified trimester: Secondary | ICD-10-CM | POA: Diagnosis present

## 2020-04-01 DIAGNOSIS — Z98891 History of uterine scar from previous surgery: Secondary | ICD-10-CM | POA: Diagnosis present

## 2020-04-01 DIAGNOSIS — O99213 Obesity complicating pregnancy, third trimester: Secondary | ICD-10-CM

## 2020-04-01 DIAGNOSIS — O26613 Liver and biliary tract disorders in pregnancy, third trimester: Secondary | ICD-10-CM | POA: Diagnosis not present

## 2020-04-01 DIAGNOSIS — O98719 Human immunodeficiency virus [HIV] disease complicating pregnancy, unspecified trimester: Secondary | ICD-10-CM | POA: Insufficient documentation

## 2020-04-01 DIAGNOSIS — Z789 Other specified health status: Secondary | ICD-10-CM | POA: Diagnosis present

## 2020-04-01 DIAGNOSIS — O24419 Gestational diabetes mellitus in pregnancy, unspecified control: Secondary | ICD-10-CM

## 2020-04-01 DIAGNOSIS — E669 Obesity, unspecified: Secondary | ICD-10-CM | POA: Diagnosis not present

## 2020-04-01 DIAGNOSIS — Z603 Acculturation difficulty: Secondary | ICD-10-CM

## 2020-04-01 DIAGNOSIS — O24415 Gestational diabetes mellitus in pregnancy, controlled by oral hypoglycemic drugs: Secondary | ICD-10-CM

## 2020-04-01 DIAGNOSIS — O98713 Human immunodeficiency virus [HIV] disease complicating pregnancy, third trimester: Secondary | ICD-10-CM | POA: Diagnosis not present

## 2020-04-01 DIAGNOSIS — K831 Obstruction of bile duct: Secondary | ICD-10-CM | POA: Diagnosis not present

## 2020-04-01 DIAGNOSIS — Z3A32 32 weeks gestation of pregnancy: Secondary | ICD-10-CM | POA: Diagnosis not present

## 2020-04-07 ENCOUNTER — Ambulatory Visit: Payer: Medicaid Other | Admitting: Registered"

## 2020-04-07 ENCOUNTER — Ambulatory Visit: Payer: Self-pay

## 2020-04-07 ENCOUNTER — Ambulatory Visit (INDEPENDENT_AMBULATORY_CARE_PROVIDER_SITE_OTHER): Payer: Medicaid Other | Admitting: *Deleted

## 2020-04-07 ENCOUNTER — Encounter: Payer: Medicaid Other | Admitting: Registered"

## 2020-04-07 ENCOUNTER — Other Ambulatory Visit: Payer: Self-pay

## 2020-04-07 ENCOUNTER — Encounter: Payer: Self-pay | Admitting: Obstetrics and Gynecology

## 2020-04-07 ENCOUNTER — Other Ambulatory Visit: Payer: Medicaid Other

## 2020-04-07 ENCOUNTER — Ambulatory Visit (INDEPENDENT_AMBULATORY_CARE_PROVIDER_SITE_OTHER): Payer: Medicaid Other | Admitting: Obstetrics and Gynecology

## 2020-04-07 VITALS — BP 129/85 | HR 79 | Wt 214.7 lb

## 2020-04-07 DIAGNOSIS — O98719 Human immunodeficiency virus [HIV] disease complicating pregnancy, unspecified trimester: Secondary | ICD-10-CM | POA: Diagnosis not present

## 2020-04-07 DIAGNOSIS — O98713 Human immunodeficiency virus [HIV] disease complicating pregnancy, third trimester: Secondary | ICD-10-CM

## 2020-04-07 DIAGNOSIS — O24415 Gestational diabetes mellitus in pregnancy, controlled by oral hypoglycemic drugs: Secondary | ICD-10-CM

## 2020-04-07 DIAGNOSIS — O26619 Liver and biliary tract disorders in pregnancy, unspecified trimester: Secondary | ICD-10-CM

## 2020-04-07 DIAGNOSIS — K831 Obstruction of bile duct: Secondary | ICD-10-CM

## 2020-04-07 DIAGNOSIS — O09523 Supervision of elderly multigravida, third trimester: Secondary | ICD-10-CM

## 2020-04-07 DIAGNOSIS — O24419 Gestational diabetes mellitus in pregnancy, unspecified control: Secondary | ICD-10-CM

## 2020-04-07 DIAGNOSIS — O26649 Intrahepatic cholestasis of pregnancy, unspecified trimester: Secondary | ICD-10-CM

## 2020-04-07 DIAGNOSIS — Z98891 History of uterine scar from previous surgery: Secondary | ICD-10-CM

## 2020-04-07 DIAGNOSIS — O24410 Gestational diabetes mellitus in pregnancy, diet controlled: Secondary | ICD-10-CM

## 2020-04-07 DIAGNOSIS — O0990 Supervision of high risk pregnancy, unspecified, unspecified trimester: Secondary | ICD-10-CM

## 2020-04-07 NOTE — Progress Notes (Signed)
In-Interpreter services provided by Marianna Fuss from [facility]  Patient was seen on 04/07/20 for follow-up assessment and education for Gestational Diabetes. EDD 05/23/20; [redacted]w[redacted]d. Patient requested this follow-up appointment. Patient blood sugar readings are great and patient did not have any questions. Patient has been in the [facility] office for over 2 hours with 2 other appointment and wants to also speak with the [facility] representatives so RD did not keep patient in visit.   Review of Blood Glucose Log >90% of readings WNL    The following learning objectives reviewed during follow-up visit:   Blood sugar is in good control  Plan:  Continue with the current diet   Patient instructed to monitor glucose levels: FBS: 60 - 95 mg/dl 2 hour: <120 mg/dl  Patient received the following handouts: None  Patient will be seen for follow-up as needed.

## 2020-04-07 NOTE — Progress Notes (Signed)

## 2020-04-07 NOTE — Progress Notes (Signed)
Pt has Glucose paper log.

## 2020-04-07 NOTE — Progress Notes (Signed)
Subjective:  Miranda George is a 40 y.o. G3P1011 at [redacted]w[redacted]d being seen today for ongoing prenatal care.  She is currently monitored for the following issues for this high-risk pregnancy and has Supervision of high risk pregnancy, antepartum; AMA (advanced maternal age) multigravida 68+; Language barrier; HIV disease affecting pregnancy; History of C-section; Gestational diabetes; Cholestasis of pregnancy, antepartum; and Excessive fetal growth affecting management of mother in third trimester, antepartum on their problem list.  Patient reports general discomforts of pregnancy.  Contractions: Irritability. Vag. Bleeding: None.  Movement: Present. Denies leaking of fluid.   The following portions of the patient's history were reviewed and updated as appropriate: allergies, current medications, past family history, past medical history, past social history, past surgical history and problem list. Problem list updated.  Objective:   Vitals:   04/07/20 1333  BP: 129/85  Pulse: 79  Weight: 214 lb 11.2 oz (97.4 kg)    Fetal Status: Fetal Heart Rate (bpm): 149   Movement: Present     General:  Alert, oriented and cooperative. Patient is in no acute distress.  Skin: Skin is warm and dry. No rash noted.   Cardiovascular: Normal heart rate noted  Respiratory: Normal respiratory effort, no problems with respiration noted  Abdomen: Soft, gravid, appropriate for gestational age. Pain/Pressure: Present     Pelvic:  Cervical exam deferred        Extremities: Normal range of motion.  Edema: Trace  Mental Status: Normal mood and affect. Normal behavior. Normal judgment and thought content.   Urinalysis:      Assessment and Plan:  Pregnancy: G3P1011 at [redacted]w[redacted]d  1. Supervision of high risk pregnancy, antepartum Stable  2. HIV disease affecting pregnancy in third trimester Stable  3. Multigravida of advanced maternal age in third trimester Stable  4. Cholestasis of pregnancy,  antepartum Stable  5. Diet controlled gestational diabetes mellitus (GDM) in third trimester CBG's in goal range  BPP/NST today Growth 86% on 04/01/20, f/u scheduled  6. History of C-section For repeat at 39 weeks  Preterm labor symptoms and general obstetric precautions including but not limited to vaginal bleeding, contractions, leaking of fluid and fetal movement were reviewed in detail with the patient. Please refer to After Visit Summary for other counseling recommendations.  Return in about 2 weeks (around 04/21/2020) for OB visit, face to face, MD only, continue with weekly antenatal testing.   Chancy Milroy, MD

## 2020-04-08 ENCOUNTER — Ambulatory Visit: Payer: Medicaid Other

## 2020-04-11 ENCOUNTER — Other Ambulatory Visit: Payer: Self-pay

## 2020-04-12 ENCOUNTER — Telehealth: Payer: Self-pay | Admitting: Family Medicine

## 2020-04-12 NOTE — Telephone Encounter (Signed)
Gentleman calling from ' [employer]" and wanted info for pt. Asked for date of pregnancy, if delivered, when last appt, when next appt.  I advised due to HIPPA we will need a release of info form sent to use to release pt info. Caller satted he was exempt from [place], but he would fax form to [place]. I did not release pt info and advised I would wait on his release form. Called agreed to protocol

## 2020-04-12 NOTE — Telephone Encounter (Signed)
Collins [place] called from [facility] contact tracing for additional information on pt. Per chart review pt had new diagnosis of HIV during pregnancy. Will route to clinical pool for further management.

## 2020-04-14 ENCOUNTER — Other Ambulatory Visit (HOSPITAL_COMMUNITY): Payer: Self-pay

## 2020-04-14 ENCOUNTER — Ambulatory Visit (INDEPENDENT_AMBULATORY_CARE_PROVIDER_SITE_OTHER): Payer: Medicaid Other | Admitting: *Deleted

## 2020-04-14 ENCOUNTER — Other Ambulatory Visit: Payer: Self-pay

## 2020-04-14 ENCOUNTER — Ambulatory Visit: Payer: Self-pay

## 2020-04-14 VITALS — BP 120/81 | HR 74 | Wt 215.0 lb

## 2020-04-14 DIAGNOSIS — O24415 Gestational diabetes mellitus in pregnancy, controlled by oral hypoglycemic drugs: Secondary | ICD-10-CM

## 2020-04-14 DIAGNOSIS — K831 Obstruction of bile duct: Secondary | ICD-10-CM

## 2020-04-14 DIAGNOSIS — O26619 Liver and biliary tract disorders in pregnancy, unspecified trimester: Secondary | ICD-10-CM

## 2020-04-14 NOTE — Progress Notes (Signed)
Video interpreter Felicita Gage [phone] used for encounter.

## 2020-04-14 NOTE — Telephone Encounter (Signed)
Returned call to Dannielle Burn and provided information about patient's due date. He had no further questions.

## 2020-04-19 ENCOUNTER — Telehealth (HOSPITAL_COMMUNITY): Payer: Self-pay | Admitting: *Deleted

## 2020-04-19 NOTE — Pre-Procedure Instructions (Signed)
Interpreter number [phone]

## 2020-04-19 NOTE — Telephone Encounter (Signed)
Preadmission screen  

## 2020-04-21 ENCOUNTER — Ambulatory Visit (INDEPENDENT_AMBULATORY_CARE_PROVIDER_SITE_OTHER): Payer: Medicaid Other

## 2020-04-21 ENCOUNTER — Telehealth (HOSPITAL_COMMUNITY): Payer: Self-pay | Admitting: *Deleted

## 2020-04-21 ENCOUNTER — Ambulatory Visit (INDEPENDENT_AMBULATORY_CARE_PROVIDER_SITE_OTHER): Payer: Medicaid Other | Admitting: Obstetrics and Gynecology

## 2020-04-21 ENCOUNTER — Ambulatory Visit: Payer: Medicaid Other | Admitting: *Deleted

## 2020-04-21 ENCOUNTER — Other Ambulatory Visit: Payer: Self-pay

## 2020-04-21 ENCOUNTER — Encounter (HOSPITAL_COMMUNITY): Payer: Self-pay

## 2020-04-21 VITALS — BP 146/92 | Wt 218.3 lb

## 2020-04-21 DIAGNOSIS — K831 Obstruction of bile duct: Secondary | ICD-10-CM | POA: Diagnosis not present

## 2020-04-21 DIAGNOSIS — O98713 Human immunodeficiency virus [HIV] disease complicating pregnancy, third trimester: Secondary | ICD-10-CM

## 2020-04-21 DIAGNOSIS — O24415 Gestational diabetes mellitus in pregnancy, controlled by oral hypoglycemic drugs: Secondary | ICD-10-CM | POA: Diagnosis not present

## 2020-04-21 DIAGNOSIS — O0990 Supervision of high risk pregnancy, unspecified, unspecified trimester: Secondary | ICD-10-CM

## 2020-04-21 DIAGNOSIS — O133 Gestational [pregnancy-induced] hypertension without significant proteinuria, third trimester: Secondary | ICD-10-CM | POA: Insufficient documentation

## 2020-04-21 DIAGNOSIS — O26619 Liver and biliary tract disorders in pregnancy, unspecified trimester: Secondary | ICD-10-CM

## 2020-04-21 DIAGNOSIS — O3663X0 Maternal care for excessive fetal growth, third trimester, not applicable or unspecified: Secondary | ICD-10-CM

## 2020-04-21 DIAGNOSIS — Z789 Other specified health status: Secondary | ICD-10-CM

## 2020-04-21 DIAGNOSIS — O09523 Supervision of elderly multigravida, third trimester: Secondary | ICD-10-CM

## 2020-04-21 DIAGNOSIS — Z98891 History of uterine scar from previous surgery: Secondary | ICD-10-CM

## 2020-04-21 NOTE — Patient Instructions (Addendum)
Miranda George  04/21/2020                                         Instrucciones Para Antes de la Ciruga   Su ciruga est programada para 05/02/2020  (your procedure is scheduled on) Entre por la entrada [facility]  a las [address] -(enter through the main entrance at [facility], [place] [address] para informarnos de su llegada. (pick up phone, dial [phone] on arrival)     Por favor llame al [phone] si tiene algn problema en la maana de la ciruga (please call this number if you have any problems the morning of surgery.)                  Recuerde: (Remember)  No coma alimentos. (Do not eat food (After Midnight) Desps de medianoche)    No tome lquidos claros. (Do not drink clear liquids (After Midnight) Desps de medianoche)    No use joyas, maquillaje de ojos, lpiz labial, crema para el cuerpo o esmalte de uas oscuro. (Do not wear jewelry, eye makeup, lipstick, body lotion, or dark fingernail polish). Puede usar desodorante (you may wear deodorant)    No se afeite 48 horas de su ciruga. (Do not shave 48 hours before your surgery)    No traiga objetos de valor al hospital.  [place] no se hace responsable de ninguna pertenencia, ni objetos de valor que haya trado al hospital. (Do not bring valuable to the hospital.  [place] is not responsible for any belongings or valuables brought to the hospital)   [facility] medicinas en la maana de la ciruga con un SORBITO de agua urosidiol (take these meds the morning of surgery with a SIP of water)     Durante la ciruga no se pueden usar lentes de contacto, dentaduras o puentes. (Contacts, dentures or bridgework cannot be worn in surgery).   Si va a ser ingresado despus de la ciruga, deje la [employer] en el carro hasta que se le haya asignado una habitacin. (If you are to be admitted after surgery, leave suitcase in car until your room has been  assigned.)   A los pacientes que se les d de alta el mismo da no se les permitir manejar a casa.  (Patients discharged on the day of surgery will not be allowed to drive home)    [place] telfono del Teacher, adult education NA. (Name and telephone number of your driver)   Instrucciones especiales N/A (Special Instructions)   Por favor, lea las hojas informativas que le entregaron. (Please read over the following fact sheets that you were given) Surgical Site Infection Prevention

## 2020-04-21 NOTE — Progress Notes (Signed)
Interpreter Eda Royal present for encounter.  Pt denies H/A or visual disturbances.

## 2020-04-21 NOTE — Pre-Procedure Instructions (Signed)
Interpreter number [phone]

## 2020-04-21 NOTE — Telephone Encounter (Signed)
Preadmission screen  

## 2020-04-21 NOTE — Progress Notes (Signed)
   PRENATAL VISIT NOTE  Subjective:  Miranda George is a 40 y.o. G3P1011 at [redacted]w[redacted]d being seen today for ongoing prenatal care.  She is currently monitored for the following issues for this high-risk pregnancy and has Supervision of high risk pregnancy, antepartum; AMA (advanced maternal age) multigravida 78+; Language barrier; HIV disease affecting pregnancy; History of C-section; Gestational diabetes; Cholestasis of pregnancy, antepartum; Excessive fetal growth affecting management of mother in third trimester, antepartum; and Transient hypertension of pregnancy in third trimester on their problem list.  Patient reports no complaints.   . Vag. Bleeding: None.  Movement: Present. Denies leaking of fluid.   The following portions of the patient's history were reviewed and updated as appropriate: allergies, current medications, past family history, past medical history, past social history, past surgical history and problem list.   Objective:   Vitals:   04/21/20 1450 04/21/20 1531  BP: (!) 146/78 (!) 146/92  Weight: 218 lb 4.8 oz (99 kg)     Fetal Status: Fetal Heart Rate (bpm): RNST   Movement: Present     General:  Alert, oriented and cooperative. Patient is in no acute distress.  Skin: Skin is warm and dry. No rash noted.   Cardiovascular: Normal heart rate noted  Respiratory: Normal respiratory effort, no problems with respiration noted  Abdomen: Soft, gravid, appropriate for gestational age.  Pain/Pressure: Present     Pelvic: Cervical exam deferred        Extremities: Normal range of motion.     Mental Status: Normal mood and affect. Normal behavior. Normal judgment and thought content.   Assessment and Plan:  Pregnancy: G3P1011 at [redacted]w[redacted]d 1. Supervision of high risk pregnancy, antepartum Routine care. No BTL 3/18: 86%, 2351gm, ac 98% - Comprehensive metabolic panel - CBC - Protein / creatinine ratio, urine  2. Cholestasis of pregnancy, antepartum Continue actigall.  bpp reassuring today 10/10. For 37wk delivery on 4/18.   3. Gestational diabetes mellitus (GDM) in third trimester controlled on oral hypoglycemic drug On metformin 500 qhs. Patient gives normal CBG values  4. HIV disease affecting pregnancy in third trimester VL undetectable on 2/14. Followed by ID. Continue truvada and tivicay.   52. Multigravida of advanced maternal age in third trimester No issues  6. Language barrier Interpreter used  7. History of C-section For rpt c/s, already scheduled 4/18. Will need AZT 4h pre op  8. Excessive fetal growth affecting management of pregnancy in third trimester, single or unspecified fetus  9. Transient hypertension of pregnancy in third trimester Precautions given. Labs today. follo - Comprehensive metabolic panel - CBC - Protein / creatinine ratio, urine  Preterm labor symptoms and general obstetric precautions including but not limited to vaginal bleeding, contractions, leaking of fluid and fetal movement were reviewed in detail with the patient. Please refer to After Visit Summary for other counseling recommendations.   No follow-ups on file.  Future Appointments  Date Time Provider [place]  04/27/2020  8:15 AM [facility] NURSE [facility]  04/27/2020  8:30 AM [facility] US3 [facility]  04/27/2020  9:40 AM Chancy Milroy, MD [facility]  04/29/2020  9:30 AM MC-LD PAT 1 MC-INDC None  04/29/2020 10:30 AM MC-SCREENING MC-SDSC None  06/06/2020  9:30 AM [facility] LAB [facility]  06/20/2020  9:30 AM Vu, Rockey Situ, MD [facility]ID [facility]    Aletha Halim, MD

## 2020-04-22 ENCOUNTER — Telehealth: Payer: Self-pay | Admitting: *Deleted

## 2020-04-22 LAB — CBC
Hematocrit: 31.7 % — ABNORMAL LOW (ref 34.0–46.6)
Hemoglobin: 10.2 g/dL — ABNORMAL LOW (ref 11.1–15.9)
MCH: 26.5 pg — ABNORMAL LOW (ref 26.6–33.0)
MCHC: 32.2 g/dL (ref 31.5–35.7)
MCV: 82 fL (ref 79–97)
Platelets: 284 10*3/uL (ref 150–450)
RBC: 3.85 x10E6/uL (ref 3.77–5.28)
RDW: 12.8 % (ref 11.7–15.4)
WBC: 5.5 10*3/uL (ref 3.4–10.8)

## 2020-04-22 LAB — PROTEIN / CREATININE RATIO, URINE
Creatinine, Urine: 73.8 mg/dL
Protein, Ur: 17.1 mg/dL
Protein/Creat Ratio: 232 mg/g creat — ABNORMAL HIGH (ref 0–200)

## 2020-04-22 LAB — COMPREHENSIVE METABOLIC PANEL
ALT: 30 IU/L (ref 0–32)
AST: 48 IU/L — ABNORMAL HIGH (ref 0–40)
Albumin/Globulin Ratio: 1.2 (ref 1.2–2.2)
Albumin: 3.4 g/dL — ABNORMAL LOW (ref 3.8–4.8)
Alkaline Phosphatase: 189 IU/L — ABNORMAL HIGH (ref 44–121)
BUN/Creatinine Ratio: 18 (ref 9–23)
BUN: 14 mg/dL (ref 6–20)
Bilirubin Total: <0.2 mg/dL (ref 0.0–1.2)
CO2: 16 mmol/L — ABNORMAL LOW (ref 20–29)
Calcium: 8.8 mg/dL (ref 8.7–10.2)
Chloride: 106 mmol/L (ref 96–106)
Creatinine, Ser: 0.78 mg/dL (ref 0.57–1.00)
Globulin, Total: 2.9 g/dL (ref 1.5–4.5)
Glucose: 86 mg/dL (ref 65–99)
Potassium: 4.7 mmol/L (ref 3.5–5.2)
Sodium: 139 mmol/L (ref 134–144)
Total Protein: 6.3 g/dL (ref 6.0–8.5)
eGFR: 100 mL/min/{1.73_m2} (ref >59)

## 2020-04-22 NOTE — Telephone Encounter (Addendum)
-----   Message from Aletha Halim, MD sent at 04/22/2020  8:35 AM EDT ----- Regarding: bp check on monday 4/11 Clinical pool, can you call her and let her know that her labs show that she does not have pre-eclampsia, but we'd like a quick RN bp check on Monday? Thanks  You will need the interpreter. Thanks  4/8  1255  Called pt w/[employer] interpreter and informed her of test results showing that she does not have pre-eclampsia. Because of BP elevation, we would like her to come in on Monday for a BP check. Pt voiced understanding and agreed to appt on 4/11 @ 1:30.

## 2020-04-25 ENCOUNTER — Encounter: Payer: Self-pay | Admitting: Lactation Services

## 2020-04-25 ENCOUNTER — Other Ambulatory Visit: Payer: Self-pay

## 2020-04-25 ENCOUNTER — Ambulatory Visit (INDEPENDENT_AMBULATORY_CARE_PROVIDER_SITE_OTHER): Payer: Medicaid Other | Admitting: Lactation Services

## 2020-04-25 DIAGNOSIS — O0990 Supervision of high risk pregnancy, unspecified, unspecified trimester: Secondary | ICD-10-CM

## 2020-04-25 NOTE — Progress Notes (Addendum)
Patient here with older son and in [place].   Patient reports headache x 2 days, she reports she took Tylenol and it did help. HA is mild at this visit. She last had Tylenol yesterday morning. She reports intermittent dizziness that started Friday, patient reports it is most noticeable with onset of headaches although reports she feels off all day. Patient denies nausea, chest, or rib pain. She is having some mild feet and ankle swelling that is noted more when standing for long periods, no pitting noted.   She reports she felt painful contractions last night, she reports she was timing them and having them about every 6-7 minutes for more than 2 hours. She reports they felt strong enough she wanted to call 911. She is not having contractions now. Patient was advised that if she is having contractions of more than 6 in an hour for more than 2 hours, vaginal bleeding or leaking of fluid from the vagina she is to go to MAU fo revaluation. Reviewed if headache returns and is not treated with Tylenol and is accompanied by blurred vision or dizziness, it is recommended she go to the Hospital for evaluation.   She reports she is eating and drinking well. She is taking her medications as prescribed. Patient reports her blood sugars have been around 85-90.   She has no questions or concerns. She is aware she is scheduled for C/S on 4/18. Reviewed case with Dr. Elgie Congo. Patient to return for visit with MFM and [facility] on Thursday 4/14.   Patient taken to [place] prior to leaving.

## 2020-04-25 NOTE — Progress Notes (Signed)
Patient was assessed and managed by nursing staff during this encounter. I have reviewed the chart and agree with the documentation and plan. I have also made any necessary editorial changes.  Griffin Basil, MD 04/25/2020 5:12 PM

## 2020-04-27 ENCOUNTER — Encounter: Payer: Self-pay | Admitting: *Deleted

## 2020-04-27 ENCOUNTER — Ambulatory Visit: Payer: Medicaid Other | Admitting: *Deleted

## 2020-04-27 ENCOUNTER — Other Ambulatory Visit: Payer: Self-pay

## 2020-04-27 ENCOUNTER — Ambulatory Visit (INDEPENDENT_AMBULATORY_CARE_PROVIDER_SITE_OTHER): Payer: Medicaid Other | Admitting: Obstetrics and Gynecology

## 2020-04-27 ENCOUNTER — Ambulatory Visit: Payer: Medicaid Other | Attending: Maternal & Fetal Medicine

## 2020-04-27 ENCOUNTER — Encounter: Payer: Self-pay | Admitting: Obstetrics and Gynecology

## 2020-04-27 VITALS — BP 129/67 | HR 67 | Wt 217.8 lb

## 2020-04-27 DIAGNOSIS — O26649 Intrahepatic cholestasis of pregnancy, unspecified trimester: Secondary | ICD-10-CM

## 2020-04-27 DIAGNOSIS — Z789 Other specified health status: Secondary | ICD-10-CM | POA: Insufficient documentation

## 2020-04-27 DIAGNOSIS — O24419 Gestational diabetes mellitus in pregnancy, unspecified control: Secondary | ICD-10-CM | POA: Insufficient documentation

## 2020-04-27 DIAGNOSIS — O0990 Supervision of high risk pregnancy, unspecified, unspecified trimester: Secondary | ICD-10-CM

## 2020-04-27 DIAGNOSIS — O98713 Human immunodeficiency virus [HIV] disease complicating pregnancy, third trimester: Secondary | ICD-10-CM

## 2020-04-27 DIAGNOSIS — Z3A36 36 weeks gestation of pregnancy: Secondary | ICD-10-CM | POA: Diagnosis not present

## 2020-04-27 DIAGNOSIS — O24415 Gestational diabetes mellitus in pregnancy, controlled by oral hypoglycemic drugs: Secondary | ICD-10-CM

## 2020-04-27 DIAGNOSIS — Z363 Encounter for antenatal screening for malformations: Secondary | ICD-10-CM

## 2020-04-27 DIAGNOSIS — Z98891 History of uterine scar from previous surgery: Secondary | ICD-10-CM | POA: Insufficient documentation

## 2020-04-27 DIAGNOSIS — O26619 Liver and biliary tract disorders in pregnancy, unspecified trimester: Secondary | ICD-10-CM

## 2020-04-27 DIAGNOSIS — O09523 Supervision of elderly multigravida, third trimester: Secondary | ICD-10-CM | POA: Diagnosis not present

## 2020-04-27 DIAGNOSIS — K831 Obstruction of bile duct: Secondary | ICD-10-CM

## 2020-04-27 DIAGNOSIS — E669 Obesity, unspecified: Secondary | ICD-10-CM | POA: Diagnosis not present

## 2020-04-27 DIAGNOSIS — O99213 Obesity complicating pregnancy, third trimester: Secondary | ICD-10-CM

## 2020-04-27 DIAGNOSIS — O26613 Liver and biliary tract disorders in pregnancy, third trimester: Secondary | ICD-10-CM | POA: Diagnosis not present

## 2020-04-27 DIAGNOSIS — Z758 Other problems related to medical facilities and other health care: Secondary | ICD-10-CM

## 2020-04-27 NOTE — Progress Notes (Signed)
Subjective:  Miranda George is a 40 y.o. G3P1011 at [redacted]w[redacted]d being seen today for ongoing prenatal care.  She is currently monitored for the following issues for this high-risk pregnancy and has Supervision of high risk pregnancy, antepartum; AMA (advanced maternal age) multigravida 95+; Language barrier; HIV disease affecting pregnancy; History of C-section; Gestational diabetes; Cholestasis of pregnancy, antepartum; Excessive fetal growth affecting management of mother in third trimester, antepartum; and Transient hypertension of pregnancy in third trimester on their problem list.  Patient reports general discomforts of pregnancy.  Contractions: Irritability. Vag. Bleeding: None.  Movement: Present. Denies leaking of fluid.   The following portions of the patient's history were reviewed and updated as appropriate: allergies, current medications, past family history, past medical history, past social history, past surgical history and problem list. Problem list updated.  Objective:   Vitals:   04/27/20 0935  BP: 129/67  Pulse: 67  Weight: 217 lb 12.8 oz (98.8 kg)    Fetal Status: Fetal Heart Rate (bpm): 144   Movement: Present     General:  Alert, oriented and cooperative. Patient is in no acute distress.  Skin: Skin is warm and dry. No rash noted.   Cardiovascular: Normal heart rate noted  Respiratory: Normal respiratory effort, no problems with respiration noted  Abdomen: Soft, gravid, appropriate for gestational age. Pain/Pressure: Absent     Pelvic:  Cervical exam deferred        Extremities: Normal range of motion.     Mental Status: Normal mood and affect. Normal behavior. Normal judgment and thought content.   Urinalysis:      Assessment and Plan:  Pregnancy: G3P1011 at [redacted]w[redacted]d  1. Supervision of high risk pregnancy, antepartum Stable For c section 05/02/20  2. Multigravida of advanced maternal age in third trimester Stable  3. Cholestasis of pregnancy,  antepartum C section 05/02/20  4. HIV disease affecting pregnancy in third trimester Stable To arrive 4 hrs prior to c section time for HIV drugs. Pt aware  5. Gestational diabetes mellitus (GDM) in third trimester controlled on oral hypoglycemic drug CBG's stable Growth today 74 % BPP 8/8 C section as noted above  6. History of C-section As above  7. Language barrier Live interrupter used during today's visit  Term labor symptoms and general obstetric precautions including but not limited to vaginal bleeding, contractions, leaking of fluid and fetal movement were reviewed in detail with the patient. Please refer to After Visit Summary for other counseling recommendations.  Return for 2 week incision check from 05/02/20 and 4 weeks from 05/02/20 for PP visit.   Chancy Milroy, MD

## 2020-04-27 NOTE — Patient Instructions (Signed)
Parto por Essie Christine, cuidados posteriores Cesarean Delivery, Care After [employer] brinda informacin sobre cmo cuidarse despus del procedimiento. El mdico tambin podr darle indicaciones ms especficas. Comunquese con el mdico si tiene problemas o preguntas. Qu puedo esperar despus del procedimiento? Despus del procedimiento, es comn [employer] siguientes sntomas:  Una pequea cantidad de sangre o de lquido transparente que proviene de la incisin.  Enrojecimiento, hinchazn y dolor en la zona de la incisin.  Dolor y [place] abdominales.  Hemorragia vaginal (loquios). Aunque no haya tenido parto vaginal, tendr sangrado y secrecin vaginal.  Calambres plvicos.  Fatiga. Es posible que Corporate treasurer, hinchazn y Scientist, research (life sciences) en el tejido que se encuentra entre la vagina y el ano (perineo) en los siguientes casos:  Si la cesrea no fue planificada y Occupational hygienist el [place] de parto y pujar.  Si le hicieron una incisin en la zona (episiotoma) o el tejido se desgarr durante el intento de parto vaginal. Siga estas indicaciones en su casa: Cuidados de la incisin  Siga las indicaciones del mdico acerca del cuidado de la incisin. Asegrese de hacer lo siguiente: ? Lvese las manos con agua y jabn antes de Quarry manager las vendas (vendaje). Use desinfectante para manos si no dispone de [place] y [place]. ? Si tiene un vendaje, cmbielo o quteselo siguiendo las indicaciones del mdico. ? No retire los puntos (suturas), las grapas cutneas, la goma para cerrar la piel o las tiras [place]. Es posible que estos cierres cutneos Animal nutritionist en la piel durante 2semanas o ms tiempo. Si los bordes de las tiras adhesivas empiezan a despegarse y Therapist, sports, puede recortar los que estn sueltos. No retire las tiras [facility] por completo a menos que el mdico se lo indique.  [place] zona de la incisin para detectar signos de infeccin. Est atento a los  siguientes signos: ? Aumento del enrojecimiento, la hinchazn o Conservation officer, historic buildings. ? Ms lquido Delorise Shiner. ? Calor. ? Pus o mal olor.  No tome baos de inmersin, no nade ni use un jacuzzi hasta que el mdico la autorice. Pregntele al mdico si puede ducharse.  [place], abrace [employer]. Esto ayuda con el dolor y [employer] posibilidad de que su incisin se abra (dehiscencia). Haga esto hasta que la incisin cicatrice.   Medicamentos  [employer] de venta libre y los recetados solamente como se lo haya indicado el mdico.  Si le recetaron un antibitico, tmelo como se lo haya indicado el mdico. No deje de tomar el antibitico aunque comience a sentirse mejor.  No conduzca ni use maquinaria pesada mientras toma analgsicos recetados. Estilo de vida  No beba alcohol. Esto es de suma importancia si est amamantando o toma analgsicos.  No consuma ningn producto que contenga nicotina o tabaco, como cigarrillos, cigarrillos electrnicos y tabaco de Higher education careers adviser. Si necesita ayuda para dejar de fumar, consulte al [employer]. Comida y bebida  Beba al menos 8vasos de ochoonzas (240cc) de agua todos los das a menos que el mdico le indique lo contrario. Si amamanta, quiz deba beber an ms cantidad de agua.  Coma alimentos ricos en [employer]. Estos alimentos pueden ayudar a prevenir o Cytogeneticist. Los alimentos ricos en fibra incluyen los siguientes: ? Panes y cereales integrales. ? Arroz integral. ? Optometrist. ? Lambert Mody y verduras frescas. Actividad  Si es posible, pdale a alguien que la ayude a cuidar del beb y con las tareas del hogar durante al  menos algunos das despus de que le den el alta del hospital.  Retome sus actividades normales como se lo haya indicado el mdico. Pregntele al mdico qu actividades son seguras para usted.  Descanse todo lo que pueda. Trate de descansar o tomar una siesta mientras el beb duerme.  No levante  ningn objeto que pese ms de 10libras (4,5kg) o el lmite de peso que le hayan indicado, hasta que el mdico le diga que puede [place].  Hable con el mdico sobre cundo puede retomar la actividad sexual. Esto puede depender de lo siguiente: ? Riesgo de sufrir una infeccin. ? La rapidez con que cicatrice. ? Comodidad y deseo de [employer] sexual.   Indicaciones generales  No use tampones ni se haga duchas vaginales hasta que el mdico la autorice.  Use ropa floja y cmoda y un sostn firme y Nurse, adult.  Mantenga el perineo limpio y seco. Cuando vaya al bao, siempre higiencese de adelante hacia atrs.  Si expulsa un cogulo de sangre, gurdelo y llame al mdico para informrselo. No deseche los cogulos de sangre por el inodoro antes de recibir indicaciones del mdico.  Consulting civil engineer a todas las visitas de seguimiento para usted y Photographer beb, como se lo haya indicado el mdico. Esto es importante. Comunquese con un mdico si:  Tiene los siguientes sntomas: ? [place]. ? Secrecin vaginal con mal olor. ? Pus o mal olor en TEFL teacher de la incisin. ? Dificultad o dolor al [employer]. ? Aumento o disminucin repentinos de la frecuencia de las deposiciones. ? Aumento del enrojecimiento, la hinchazn o el dolor alrededor de la incisin. ? Aumento del lquido o sangre proveniente de la incisin. ? Erupcin cutnea. ? Nuseas. ? Poco inters o falta de inters en actividades que solan gustarle. ? Dudas sobre su cuidado y el del beb.  Su incisin se siente caliente al tacto.  Siente dolor en las mamas y se ponen rojas o duras.  Siente tristeza o preocupacin de forma inusual.  Vomita.  Elimina un cogulo de sangre grande por la vagina.  Orina ms de lo habitual.  Se siente mareado o aturdido. Solicite ayuda inmediatamente si:  Tiene los siguientes sntomas: ? Dolor que no desaparece o no mejora con medicamentos. ? Tourist information centre manager. ? Dificultad para  respirar. ? Visin borrosa o manchas en la visin. ? Pensamientos de autolesionarse o lesionar al beb. ? Primary school teacher abdomen o en una de las piernas. ? Dolor de cabeza intenso.  Se desmaya.  Tiene una hemorragia tan intensa en la vagina que empapa ms de un apsito en [employer]. El sangrado no debe ser ms abundante que el perodo ms intenso que haya tenido. Resumen  Despus del procedimiento, es comn Theatre stage manager de la incisin, clicos abdominales, y sangrado vaginal leve.  [place] zona de la incisin para detectar signos de infeccin.  Informe al mdico sobre cualquier sntoma inusual.  Concurra a todas las visitas de seguimiento para usted y el beb, como se lo haya indicado el mdico. Esta informacin no tiene Marine scientist el consejo del mdico. Asegrese de hacerle al mdico cualquier pregunta que tenga. Document Revised: 08/14/2017 Document Reviewed: 08/14/2017 Elsevier Patient Education  [place].

## 2020-04-29 ENCOUNTER — Encounter (HOSPITAL_COMMUNITY)
Admission: RE | Admit: 2020-04-29 | Discharge: 2020-04-29 | Disposition: A | Discharge status: Home / Self Care | Payer: Medicaid Other | Source: Ambulatory Visit | Attending: Obstetrics and Gynecology | Admitting: Obstetrics and Gynecology

## 2020-04-29 ENCOUNTER — Other Ambulatory Visit: Payer: Self-pay

## 2020-04-29 ENCOUNTER — Other Ambulatory Visit (HOSPITAL_COMMUNITY)
Admission: RE | Admit: 2020-04-29 | Discharge: 2020-04-29 | Disposition: A | Discharge status: Home / Self Care | Payer: Medicaid Other | Source: Ambulatory Visit | Attending: Obstetrics and Gynecology | Admitting: Obstetrics and Gynecology

## 2020-04-29 DIAGNOSIS — Z01812 Encounter for preprocedural laboratory examination: Secondary | ICD-10-CM | POA: Diagnosis not present

## 2020-04-29 DIAGNOSIS — Z20822 Contact with and (suspected) exposure to covid-19: Secondary | ICD-10-CM | POA: Diagnosis not present

## 2020-04-29 HISTORY — DX: Obstruction of bile duct: O26.619

## 2020-04-29 HISTORY — DX: Intrahepatic cholestasis of pregnancy, unspecified trimester: O26.649

## 2020-04-29 HISTORY — DX: Obstruction of bile duct: K83.1

## 2020-04-29 HISTORY — DX: Gestational diabetes mellitus in pregnancy, unspecified control: O24.419

## 2020-04-29 LAB — CBC
HCT: 31.3 % — ABNORMAL LOW (ref 36.0–46.0)
Hemoglobin: 10.2 g/dL — ABNORMAL LOW (ref 12.0–15.0)
MCH: 26.6 pg (ref 26.0–34.0)
MCHC: 32.6 g/dL (ref 30.0–36.0)
MCV: 81.5 fL (ref 80.0–100.0)
Platelets: 279 10*3/uL (ref 150–400)
RBC: 3.84 MIL/uL — ABNORMAL LOW (ref 3.87–5.11)
RDW: 13.4 % (ref 11.5–15.5)
WBC: 5.4 10*3/uL (ref 4.0–10.5)
nRBC: 0 % (ref 0.0–0.2)

## 2020-04-29 LAB — TYPE AND SCREEN
ABO/RH(D): O POS
Antibody Screen: NEGATIVE

## 2020-04-29 LAB — RPR: RPR Ser Ql: NONREACTIVE

## 2020-04-29 LAB — SARS CORONAVIRUS 2 (TAT 6-24 HRS): SARS Coronavirus 2: NEGATIVE

## 2020-05-01 ENCOUNTER — Other Ambulatory Visit: Payer: Self-pay | Admitting: Obstetrics and Gynecology

## 2020-05-02 ENCOUNTER — Encounter (HOSPITAL_COMMUNITY): Payer: Self-pay | Admitting: Obstetrics and Gynecology

## 2020-05-02 ENCOUNTER — Encounter (HOSPITAL_COMMUNITY)
Admission: RE | Disposition: A | Discharge status: Home / Self Care | Payer: Self-pay | Source: Home / Self Care | Attending: Obstetrics and Gynecology

## 2020-05-02 ENCOUNTER — Inpatient Hospital Stay (HOSPITAL_COMMUNITY): Payer: Medicaid Other | Admitting: Anesthesiology

## 2020-05-02 ENCOUNTER — Other Ambulatory Visit: Payer: Self-pay

## 2020-05-02 ENCOUNTER — Inpatient Hospital Stay (HOSPITAL_COMMUNITY)
Admission: RE | Admit: 2020-05-02 | Discharge: 2020-05-05 | DRG: 786 | Disposition: A | Discharge status: Home / Self Care | Payer: Medicaid Other | Attending: Obstetrics and Gynecology | Admitting: Obstetrics and Gynecology

## 2020-05-02 DIAGNOSIS — O34211 Maternal care for low transverse scar from previous cesarean delivery: Secondary | ICD-10-CM | POA: Diagnosis not present

## 2020-05-02 DIAGNOSIS — O9081 Anemia of the puerperium: Secondary | ICD-10-CM | POA: Diagnosis not present

## 2020-05-02 DIAGNOSIS — O24420 Gestational diabetes mellitus in childbirth, diet controlled: Secondary | ICD-10-CM | POA: Diagnosis not present

## 2020-05-02 DIAGNOSIS — O99893 Other specified diseases and conditions complicating puerperium: Secondary | ICD-10-CM | POA: Diagnosis not present

## 2020-05-02 DIAGNOSIS — Z21 Asymptomatic human immunodeficiency virus [HIV] infection status: Secondary | ICD-10-CM | POA: Diagnosis present

## 2020-05-02 DIAGNOSIS — D62 Acute posthemorrhagic anemia: Secondary | ICD-10-CM | POA: Diagnosis not present

## 2020-05-02 DIAGNOSIS — O2662 Liver and biliary tract disorders in childbirth: Principal | ICD-10-CM | POA: Diagnosis present

## 2020-05-02 DIAGNOSIS — O99214 Obesity complicating childbirth: Secondary | ICD-10-CM | POA: Diagnosis present

## 2020-05-02 DIAGNOSIS — Z3A37 37 weeks gestation of pregnancy: Secondary | ICD-10-CM

## 2020-05-02 DIAGNOSIS — K831 Obstruction of bile duct: Secondary | ICD-10-CM | POA: Diagnosis not present

## 2020-05-02 DIAGNOSIS — O24429 Gestational diabetes mellitus in childbirth, unspecified control: Secondary | ICD-10-CM | POA: Diagnosis not present

## 2020-05-02 DIAGNOSIS — K5900 Constipation, unspecified: Secondary | ICD-10-CM | POA: Diagnosis not present

## 2020-05-02 DIAGNOSIS — O24424 Gestational diabetes mellitus in childbirth, insulin controlled: Secondary | ICD-10-CM | POA: Diagnosis not present

## 2020-05-02 DIAGNOSIS — O9872 Human immunodeficiency virus [HIV] disease complicating childbirth: Secondary | ICD-10-CM | POA: Diagnosis present

## 2020-05-02 DIAGNOSIS — O26649 Intrahepatic cholestasis of pregnancy, unspecified trimester: Secondary | ICD-10-CM | POA: Diagnosis present

## 2020-05-02 DIAGNOSIS — O98719 Human immunodeficiency virus [HIV] disease complicating pregnancy, unspecified trimester: Secondary | ICD-10-CM | POA: Diagnosis present

## 2020-05-02 DIAGNOSIS — Z98891 History of uterine scar from previous surgery: Secondary | ICD-10-CM

## 2020-05-02 DIAGNOSIS — B20 Human immunodeficiency virus [HIV] disease: Secondary | ICD-10-CM | POA: Diagnosis present

## 2020-05-02 DIAGNOSIS — O134 Gestational [pregnancy-induced] hypertension without significant proteinuria, complicating childbirth: Secondary | ICD-10-CM | POA: Diagnosis not present

## 2020-05-02 DIAGNOSIS — O24419 Gestational diabetes mellitus in pregnancy, unspecified control: Secondary | ICD-10-CM | POA: Diagnosis present

## 2020-05-02 DIAGNOSIS — O26893 Other specified pregnancy related conditions, third trimester: Secondary | ICD-10-CM | POA: Diagnosis not present

## 2020-05-02 DIAGNOSIS — K710 Toxic liver disease with cholestasis: Secondary | ICD-10-CM | POA: Diagnosis not present

## 2020-05-02 LAB — COMPREHENSIVE METABOLIC PANEL
ALT: 27 U/L (ref 0–44)
AST: 44 U/L — ABNORMAL HIGH (ref 15–41)
Albumin: 2.3 g/dL — ABNORMAL LOW (ref 3.5–5.0)
Alkaline Phosphatase: 165 U/L — ABNORMAL HIGH (ref 38–126)
Anion gap: 5 (ref 5–15)
BUN: 17 mg/dL (ref 6–20)
CO2: 20 mmol/L — ABNORMAL LOW (ref 22–32)
Calcium: 8.4 mg/dL — ABNORMAL LOW (ref 8.9–10.3)
Chloride: 108 mmol/L (ref 98–111)
Creatinine, Ser: 0.78 mg/dL (ref 0.44–1.00)
GFR, Estimated: >60 mL/min (ref >60)
Glucose, Bld: 77 mg/dL (ref 70–99)
Potassium: 4 mmol/L (ref 3.5–5.1)
Sodium: 133 mmol/L — ABNORMAL LOW (ref 135–145)
Total Bilirubin: 0.4 mg/dL (ref 0.3–1.2)
Total Protein: 6.1 g/dL — ABNORMAL LOW (ref 6.5–8.1)

## 2020-05-02 LAB — PROTEIN / CREATININE RATIO, URINE
Creatinine, Urine: 80.7 mg/dL
Protein Creatinine Ratio: 0.29 mg/mg{Cre} — ABNORMAL HIGH (ref 0.00–0.15)
Total Protein, Urine: 23 mg/dL

## 2020-05-02 LAB — GLUCOSE, CAPILLARY
Glucose-Capillary: 85 mg/dL (ref 70–99)
Glucose-Capillary: 92 mg/dL (ref 70–99)

## 2020-05-02 LAB — CBC
HCT: 30.4 % — ABNORMAL LOW (ref 36.0–46.0)
Hemoglobin: 9.7 g/dL — ABNORMAL LOW (ref 12.0–15.0)
MCH: 26.1 pg (ref 26.0–34.0)
MCHC: 31.9 g/dL (ref 30.0–36.0)
MCV: 81.9 fL (ref 80.0–100.0)
Platelets: 255 10*3/uL (ref 150–400)
RBC: 3.71 MIL/uL — ABNORMAL LOW (ref 3.87–5.11)
RDW: 13.7 % (ref 11.5–15.5)
WBC: 5.3 10*3/uL (ref 4.0–10.5)
nRBC: 0.4 % — ABNORMAL HIGH (ref 0.0–0.2)

## 2020-05-02 SURGERY — Surgical Case
Anesthesia: Spinal | Wound class: Clean Contaminated

## 2020-05-02 MED ORDER — OXYTOCIN-SODIUM CHLORIDE 30-0.9 UT/500ML-% IV SOLN: 2.5000 [IU]/h | INTRAVENOUS | Status: AC

## 2020-05-02 MED ORDER — DEXAMETHASONE SODIUM PHOSPHATE 10 MG/ML IJ SOLN
INTRAMUSCULAR | Status: AC
  Filled 2020-05-02: qty 1

## 2020-05-02 MED ORDER — SCOPOLAMINE 1 MG/3DAYS TD PT72: 1.0000 | MEDICATED_PATCH | Freq: Once | TRANSDERMAL | Status: DC

## 2020-05-02 MED ORDER — IBUPROFEN 600 MG PO TABS
600.0000 mg | ORAL_TABLET | Freq: Four times a day (QID) | ORAL | Status: DC | PRN
  Administered 2020-05-03: 600 mg via ORAL
  Filled 2020-05-02: qty 1

## 2020-05-02 MED ORDER — FENTANYL CITRATE (PF) 100 MCG/2ML IJ SOLN
INTRAMUSCULAR | Status: DC | PRN
  Administered 2020-05-02: 15 ug via INTRATHECAL

## 2020-05-02 MED ORDER — DOLUTEGRAVIR SODIUM 50 MG PO TABS
50.0000 mg | ORAL_TABLET | Freq: Every evening | ORAL | Status: DC
  Administered 2020-05-02 – 2020-05-04 (×3): 50 mg via ORAL
  Filled 2020-05-02 (×4): qty 1

## 2020-05-02 MED ORDER — SENNOSIDES-DOCUSATE SODIUM 8.6-50 MG PO TABS
2.0000 | ORAL_TABLET | ORAL | Status: DC
  Administered 2020-05-02 – 2020-05-04 (×3): 2 via ORAL
  Filled 2020-05-02 (×3): qty 2

## 2020-05-02 MED ORDER — CEFAZOLIN SODIUM-DEXTROSE 2-4 GM/100ML-% IV SOLN
2.0000 g | INTRAVENOUS | Status: AC
  Administered 2020-05-02: 2 g via INTRAVENOUS

## 2020-05-02 MED ORDER — ONDANSETRON HCL 4 MG/2ML IJ SOLN
4.0000 mg | Freq: Four times a day (QID) | INTRAMUSCULAR | Status: DC | PRN
Start: 2020-05-02 — End: 2020-05-02

## 2020-05-02 MED ORDER — TETANUS-DIPHTH-ACELL PERTUSSIS 5-2.5-18.5 LF-MCG/0.5 IM SUSY: 0.5000 mL | PREFILLED_SYRINGE | Freq: Once | INTRAMUSCULAR | Status: DC

## 2020-05-02 MED ORDER — FENTANYL CITRATE (PF) 100 MCG/2ML IJ SOLN
25.0000 ug | INTRAMUSCULAR | Status: DC | PRN
  Administered 2020-05-02: 25 ug via INTRAVENOUS

## 2020-05-02 MED ORDER — NALBUPHINE HCL 10 MG/ML IJ SOLN: 5.0000 mg | Freq: Once | INTRAMUSCULAR | Status: DC | PRN

## 2020-05-02 MED ORDER — ONDANSETRON HCL 4 MG/2ML IJ SOLN: 4.0000 mg | Freq: Three times a day (TID) | INTRAMUSCULAR | Status: DC | PRN

## 2020-05-02 MED ORDER — KETOROLAC TROMETHAMINE 30 MG/ML IJ SOLN
30.0000 mg | Freq: Four times a day (QID) | INTRAMUSCULAR | Status: DC | PRN
  Administered 2020-05-02: 30 mg via INTRAVENOUS

## 2020-05-02 MED ORDER — STERILE WATER FOR IRRIGATION IR SOLN
Status: DC | PRN
  Administered 2020-05-02: 1000 mL

## 2020-05-02 MED ORDER — SODIUM CHLORIDE 0.9% FLUSH: 3.0000 mL | INTRAVENOUS | Status: DC | PRN

## 2020-05-02 MED ORDER — SODIUM CHLORIDE 0.9 % IR SOLN
Status: DC | PRN
  Administered 2020-05-02: 1000 mL

## 2020-05-02 MED ORDER — DIPHENHYDRAMINE HCL 25 MG PO CAPS: 25.0000 mg | ORAL_CAPSULE | ORAL | Status: DC | PRN

## 2020-05-02 MED ORDER — MORPHINE SULFATE (PF) 0.5 MG/ML IJ SOLN
INTRAMUSCULAR | Status: DC | PRN
  Administered 2020-05-02: 150 ug via INTRATHECAL

## 2020-05-02 MED ORDER — MENTHOL 3 MG MT LOZG: 1.0000 | LOZENGE | OROMUCOSAL | Status: DC | PRN

## 2020-05-02 MED ORDER — KETOROLAC TROMETHAMINE 30 MG/ML IJ SOLN
INTRAMUSCULAR | Status: AC
  Filled 2020-05-02: qty 1

## 2020-05-02 MED ORDER — DIPHENHYDRAMINE HCL 25 MG PO CAPS: 25.0000 mg | ORAL_CAPSULE | Freq: Four times a day (QID) | ORAL | Status: DC | PRN

## 2020-05-02 MED ORDER — ENOXAPARIN SODIUM 60 MG/0.6ML ~~LOC~~ SOLN
50.0000 mg | SUBCUTANEOUS | Status: DC
  Administered 2020-05-03 – 2020-05-05 (×3): 50 mg via SUBCUTANEOUS
  Filled 2020-05-02 (×3): qty 0.6

## 2020-05-02 MED ORDER — DIPHENHYDRAMINE HCL 50 MG/ML IJ SOLN: 12.5000 mg | INTRAMUSCULAR | Status: DC | PRN

## 2020-05-02 MED ORDER — NALBUPHINE HCL 10 MG/ML IJ SOLN: 5.0000 mg | INTRAMUSCULAR | Status: DC | PRN

## 2020-05-02 MED ORDER — PHENYLEPHRINE HCL-NACL 20-0.9 MG/250ML-% IV SOLN
INTRAVENOUS | Status: DC | PRN
  Administered 2020-05-02: 60 ug/min via INTRAVENOUS

## 2020-05-02 MED ORDER — NALOXONE HCL 0.4 MG/ML IJ SOLN: 0.4000 mg | INTRAMUSCULAR | Status: DC | PRN

## 2020-05-02 MED ORDER — CEFAZOLIN SODIUM-DEXTROSE 2-4 GM/100ML-% IV SOLN
INTRAVENOUS | Status: AC
  Filled 2020-05-02: qty 100

## 2020-05-02 MED ORDER — OXYTOCIN-SODIUM CHLORIDE 30-0.9 UT/500ML-% IV SOLN
INTRAVENOUS | Status: AC
  Filled 2020-05-02: qty 500

## 2020-05-02 MED ORDER — MEPERIDINE HCL 25 MG/ML IJ SOLN: 6.2500 mg | INTRAMUSCULAR | Status: DC | PRN

## 2020-05-02 MED ORDER — COCONUT OIL OIL: 1.0000 "application " | TOPICAL_OIL | Status: DC | PRN

## 2020-05-02 MED ORDER — ZIDOVUDINE 10 MG/ML IV SOLN
1.0000 mg/kg/h | INTRAVENOUS | Status: DC
  Administered 2020-05-02: 1 mg/kg/h via INTRAVENOUS
  Filled 2020-05-02 (×3): qty 40

## 2020-05-02 MED ORDER — ZIDOVUDINE 10 MG/ML IV SOLN
2.0000 mg/kg | Freq: Once | INTRAVENOUS | Status: AC
  Administered 2020-05-02: 198 mg via INTRAVENOUS
  Filled 2020-05-02: qty 19.8

## 2020-05-02 MED ORDER — PRENATAL MULTIVITAMIN CH
1.0000 | ORAL_TABLET | Freq: Every day | ORAL | Status: DC
  Administered 2020-05-02 – 2020-05-04 (×3): 1 via ORAL
  Filled 2020-05-02 (×3): qty 1

## 2020-05-02 MED ORDER — LACTATED RINGERS IV SOLN: INTRAVENOUS | Status: DC

## 2020-05-02 MED ORDER — FENTANYL CITRATE (PF) 100 MCG/2ML IJ SOLN
INTRAMUSCULAR | Status: AC
  Filled 2020-05-02: qty 2

## 2020-05-02 MED ORDER — LACTATED RINGERS IV SOLN: INTRAVENOUS | Status: DC | PRN

## 2020-05-02 MED ORDER — SIMETHICONE 80 MG PO CHEW: 80.0000 mg | CHEWABLE_TABLET | ORAL | Status: DC | PRN

## 2020-05-02 MED ORDER — DEXAMETHASONE SODIUM PHOSPHATE 10 MG/ML IJ SOLN
INTRAMUSCULAR | Status: DC | PRN
  Administered 2020-05-02: 5 mg via INTRAVENOUS

## 2020-05-02 MED ORDER — POVIDONE-IODINE 10 % EX SWAB
2.0000 "application " | Freq: Once | CUTANEOUS | Status: AC
  Administered 2020-05-02: 2 via TOPICAL

## 2020-05-02 MED ORDER — NALOXONE HCL 4 MG/10ML IJ SOLN
1.0000 ug/kg/h | INTRAVENOUS | Status: DC | PRN
  Filled 2020-05-02: qty 5

## 2020-05-02 MED ORDER — OXYCODONE-ACETAMINOPHEN 5-325 MG PO TABS
1.0000 | ORAL_TABLET | ORAL | Status: DC | PRN
  Administered 2020-05-03: 1 via ORAL
  Filled 2020-05-02: qty 1

## 2020-05-02 MED ORDER — KETOROLAC TROMETHAMINE 30 MG/ML IJ SOLN: 30.0000 mg | Freq: Four times a day (QID) | INTRAMUSCULAR | Status: DC | PRN

## 2020-05-02 MED ORDER — BUPIVACAINE IN DEXTROSE 0.75-8.25 % IT SOLN
INTRATHECAL | Status: DC | PRN
  Administered 2020-05-02: 1.4 mL via INTRATHECAL

## 2020-05-02 MED ORDER — WITCH HAZEL-GLYCERIN EX PADS: 1.0000 "application " | MEDICATED_PAD | CUTANEOUS | Status: DC | PRN

## 2020-05-02 MED ORDER — OXYTOCIN-SODIUM CHLORIDE 30-0.9 UT/500ML-% IV SOLN
INTRAVENOUS | Status: DC | PRN
  Administered 2020-05-02: 300 mL via INTRAVENOUS

## 2020-05-02 MED ORDER — OXYCODONE HCL 5 MG/5ML PO SOLN: 5.0000 mg | Freq: Once | ORAL | Status: DC | PRN

## 2020-05-02 MED ORDER — ONDANSETRON HCL 4 MG/2ML IJ SOLN
INTRAMUSCULAR | Status: AC
  Filled 2020-05-02: qty 2

## 2020-05-02 MED ORDER — EMTRICITABINE-TENOFOVIR AF 200-25 MG PO TABS
1.0000 | ORAL_TABLET | Freq: Every day | ORAL | Status: DC
Start: 2020-05-02 — End: 2020-05-05
  Administered 2020-05-02 – 2020-05-04 (×3): 1 via ORAL
  Filled 2020-05-02 (×4): qty 1

## 2020-05-02 MED ORDER — MORPHINE SULFATE (PF) 0.5 MG/ML IJ SOLN
INTRAMUSCULAR | Status: AC
  Filled 2020-05-02: qty 10

## 2020-05-02 MED ORDER — OXYCODONE HCL 5 MG PO TABS: 5.0000 mg | ORAL_TABLET | Freq: Once | ORAL | Status: DC | PRN

## 2020-05-02 MED ORDER — ZOLPIDEM TARTRATE 5 MG PO TABS: 5.0000 mg | ORAL_TABLET | Freq: Every evening | ORAL | Status: DC | PRN

## 2020-05-02 MED ORDER — DIBUCAINE (PERIANAL) 1 % EX OINT: 1.0000 "application " | TOPICAL_OINTMENT | CUTANEOUS | Status: DC | PRN

## 2020-05-02 MED ORDER — ACETAMINOPHEN 500 MG PO TABS
1000.0000 mg | ORAL_TABLET | Freq: Four times a day (QID) | ORAL | Status: DC | PRN
  Administered 2020-05-02 – 2020-05-03 (×2): 1000 mg via ORAL
  Filled 2020-05-02 (×2): qty 2

## 2020-05-02 MED ORDER — SIMETHICONE 80 MG PO CHEW
80.0000 mg | CHEWABLE_TABLET | Freq: Three times a day (TID) | ORAL | Status: DC
  Administered 2020-05-02 – 2020-05-05 (×9): 80 mg via ORAL
  Filled 2020-05-02 (×9): qty 1

## 2020-05-02 MED ORDER — PHENYLEPHRINE HCL-NACL 20-0.9 MG/250ML-% IV SOLN
INTRAVENOUS | Status: AC
  Filled 2020-05-02: qty 250

## 2020-05-02 MED ORDER — ONDANSETRON HCL 4 MG/2ML IJ SOLN
INTRAMUSCULAR | Status: DC | PRN
  Administered 2020-05-02: 4 mg via INTRAVENOUS

## 2020-05-02 SURGICAL SUPPLY — 36 items
BENZOIN TINCTURE PRP APPL 2/3 (GAUZE/BANDAGES/DRESSINGS) ×2 IMPLANT
CHLORAPREP W/TINT 26ML (MISCELLANEOUS) ×2 IMPLANT
CLAMP CORD UMBIL (MISCELLANEOUS) IMPLANT
CLOTH BEACON ORANGE TIMEOUT ST (SAFETY) ×2 IMPLANT
DRESSING PREVENA PLUS CUSTOM (GAUZE/BANDAGES/DRESSINGS) ×1 IMPLANT
DRSG OPSITE POSTOP 4X10 (GAUZE/BANDAGES/DRESSINGS) ×2 IMPLANT
DRSG PREVENA PLUS CUSTOM (GAUZE/BANDAGES/DRESSINGS) ×2
ELECT REM PT RETURN 9FT ADLT (ELECTROSURGICAL) ×2
ELECTRODE REM PT RTRN 9FT ADLT (ELECTROSURGICAL) ×1 IMPLANT
EXTRACTOR VACUUM M CUP 4 TUBE (SUCTIONS) IMPLANT
GLOVE BIOGEL PI IND STRL 7.0 (GLOVE) ×2 IMPLANT
GLOVE BIOGEL PI IND STRL 7.5 (GLOVE) ×2 IMPLANT
GLOVE BIOGEL PI INDICATOR 7.0 (GLOVE) ×2
GLOVE BIOGEL PI INDICATOR 7.5 (GLOVE) ×2
GLOVE ECLIPSE 7.5 STRL STRAW (GLOVE) ×2 IMPLANT
GOWN STRL REUS W/TWL LRG LVL3 (GOWN DISPOSABLE) ×6 IMPLANT
HOVERMATT SINGLE USE (MISCELLANEOUS) ×2 IMPLANT
KIT ABG SYR 3ML LUER SLIP (SYRINGE) IMPLANT
NEEDLE HYPO 25X5/8 SAFETYGLIDE (NEEDLE) IMPLANT
NS IRRIG 1000ML POUR BTL (IV SOLUTION) ×2 IMPLANT
PACK C SECTION WH (CUSTOM PROCEDURE TRAY) ×2 IMPLANT
PAD OB MATERNITY 4.3X12.25 (PERSONAL CARE ITEMS) ×2 IMPLANT
PENCIL SMOKE EVAC W/HOLSTER (ELECTROSURGICAL) ×2 IMPLANT
RTRCTR C-SECT PINK 25CM LRG (MISCELLANEOUS) ×2 IMPLANT
STRIP CLOSURE SKIN 1/2X4 (GAUZE/BANDAGES/DRESSINGS) ×2 IMPLANT
SUT PLAIN 2 0 XLH (SUTURE) ×2 IMPLANT
SUT VIC AB 0 CT1 36 (SUTURE) ×2 IMPLANT
SUT VIC AB 0 CTX 36 (SUTURE) ×3
SUT VIC AB 0 CTX36XBRD ANBCTRL (SUTURE) ×3 IMPLANT
SUT VIC AB 2-0 CT1 27 (SUTURE) ×1
SUT VIC AB 2-0 CT1 TAPERPNT 27 (SUTURE) ×1 IMPLANT
SUT VIC AB 4-0 KS 27 (SUTURE) ×2 IMPLANT
TOWEL OR 17X24 6PK STRL BLUE (TOWEL DISPOSABLE) ×2 IMPLANT
TRAY FOLEY W/BAG SLVR 14FR LF (SET/KITS/TRAYS/PACK) ×2 IMPLANT
VACUUM CUP M-STYLE MYSTIC II (SUCTIONS) ×2 IMPLANT
WATER STERILE IRR 1000ML POUR (IV SOLUTION) ×2 IMPLANT

## 2020-05-02 NOTE — H&P (Signed)
LABOR AND DELIVERY ADMISSION HISTORY AND PHYSICAL NOTE  Miranda George is a 40 y.o. female G3P1011 with IUP at 33w0dby 12 wk u/s presenting for scheduled iol.   She reports positive fetal movement. She denies leakage of fluid or vaginal bleeding. Denies ha or vision changes. Compliant with home meds.  Prenatal History/Complications:  Past Medical History: Past Medical History:  Diagnosis Date  . Anemia   . Blood transfusion without reported diagnosis   . Cholestasis during pregnancy   . Fibroid   . GERD (gastroesophageal reflux disease)   . Gestational diabetes   . HIV (human immunodeficiency virus infection) ([place]     Past Surgical History: Past Surgical History:  Procedure Laterality Date  . APPENDECTOMY     18 years ago  . APPENDECTOMY    . CESAREAN SECTION  2012  . CESAREAN SECTION      Obstetrical History: OB History    Gravida  3   Para  1   Term  1   Preterm  0   AB  1   Living  1     SAB  1   IAB  0   Ectopic  0   Multiple      Live Births  1           Social History: Social History   Socioeconomic History  . Marital status: Married    Spouse name: Not on file  . Number of children: Not on file  . Years of education: Not on file  . Highest education level: Not on file  Occupational History  . Not on file  Tobacco Use  . Smoking status: Never Smoker  . Smokeless tobacco: Never Used  Vaping Use  . Vaping Use: Never used  Substance and Sexual Activity  . Alcohol use: Never    Comment: occasionally  . Drug use: Never  . Sexual activity: Yes    Birth control/protection: None  Other Topics Concern  . Not on file  Social History Narrative   ** Merged History Encounter **       Social Determinants of Health   Financial Resource Strain: Not on file  Food Insecurity: Food Insecurity Present  . Worried About RCharity fundraiserin the Last Year: Sometimes true  . Ran Out of Food in the Last Year: Sometimes true   Transportation Needs: No Transportation Needs  . Lack of Transportation (Medical): No  . Lack of Transportation (Non-Medical): No  Physical Activity: Not on file  Stress: Not on file  Social Connections: Not on file    Family History: Family History  Problem Relation Age of Onset  . Hypertension Mother   . Diabetes Mother   . Thyroid disease Mother   . Hypertension Father   . Diabetes Father     Allergies: No Known Allergies  Medications Prior to Admission  Medication Sig Dispense Refill Last Dose  . alum & mag hydroxide-simeth (MAALOX/MYLANTA) 200-200-20 MG/5ML suspension Take 15-30 mLs by mouth every 6 (six) hours as needed for indigestion or heartburn.     . diphenhydrAMINE (BENADRYL) 2 % cream Apply 1 application topically 3 (three) times daily as needed for itching.     . dolutegravir (TIVICAY) 50 MG tablet TAKE 1 TABLET (50 MG TOTAL) BY MOUTH DAILY. (Patient taking differently: Take 50 mg by mouth every evening.) 30 tablet 5   . emtricitabine-tenofovir (TRUVADA) 200-300 MG tablet TAKE 1 TABLET BY MOUTH DAILY. (Patient taking differently: Take 1 tablet  by mouth every evening.) 30 tablet 5   . metFORMIN (GLUCOPHAGE) 500 MG tablet Take 1 tablet (500 mg total) by mouth at bedtime. (Patient taking differently: Take 500 mg by mouth every evening.) 30 tablet 2   . Prenatal Vit-Fe Fumarate-FA (MULTIVITAMIN-PRENATAL) 27-0.8 MG TABS tablet Take 1 tablet by mouth in the morning.     . ursodiol (ACTIGALL) 500 MG tablet Take 1 tablet (500 mg total) by mouth in the morning and at bedtime. 60 tablet 2   . Accu-Chek Softclix Lancets lancets Use as instructed 100 each 12   . Blood Glucose Monitoring Suppl (ACCU-CHEK GUIDE) w/Device KIT 1 Device by Does not apply route as needed. 1 kit 0   . glucose blood test strip Use as instructed 100 each 12      Review of Systems   All systems reviewed and negative except as stated in HPI  Blood pressure (!) 151/68, pulse 95, temperature 98.7 F  (37.1 C), temperature source Oral, resp. rate 20, height '4\' 10"'  (1.473 m), weight 102.1 kg, last menstrual period 07/24/2019, SpO2 100 %. General appearance: alert, cooperative and appears stated age Lungs: clear to auscultation bilaterally Heart: regular rate and rhythm Abdomen: soft, non-tender; bowel sounds normal Extremities: No calf swelling or tenderness Fetal monitoring: 131 Uterine activity: quiet     Prenatal labs: ABO, Rh: --/--/O POS (04/15 0940) Antibody: NEG (04/15 0940) Rubella: Immune (10/21 0000) RPR: NON REACTIVE (04/15 0941)  HBsAg: NON-REACTIVE (10/28 1040)  HIV: Preliminary Reactive (01/31 0838)  GBS:   not collected 2 hr Glucola: 107/225/192 Genetic screening:  neg Anatomy US: wnl  Prenatal Transfer Tool  Maternal Diabetes: Yes:  Diabetes Type:  Insulin/Medication controlled Genetic Screening: Normal Maternal Ultrasounds/Referrals: Normal Fetal Ultrasounds or other Referrals:  None Maternal Substance Abuse:  No Significant Maternal Medications:  Ursodiol, metformin, tivicay, truvada Significant Maternal Lab Results: HIV positive  Results for orders placed or performed during the hospital encounter of 05/02/20 (from the past 24 hour(s))  Glucose, capillary   Collection Time: 05/02/20  6:23 AM  Result Value Ref Range   Glucose-Capillary 85 70 - 99 mg/dL    Patient Active Problem List   Diagnosis Date Noted  . History of cesarean section 05/02/2020  . Transient hypertension of pregnancy in third trimester 04/21/2020  . Excessive fetal growth affecting management of mother in third trimester, antepartum 03/24/2020  . Cholestasis of pregnancy, antepartum 03/16/2020  . Gestational diabetes 02/19/2020  . Supervision of high risk pregnancy, antepartum 11/13/2019  . AMA (advanced maternal age) multigravida 35+ 11/13/2019  . Language barrier 11/13/2019  . HIV disease affecting pregnancy 11/13/2019  . History of C-section 11/13/2019     Assessment: Miranda George is a 40 y.o. G3P1011 at 73w0dhere for scheduled c/s (history prior) for cholestasis.  # cholestasis of pregnancy: taking ursodiol, normal fetal movements, indication for c/s today.  # history prior cesarean:  We discussed risks/benefits of TOLAC, and using tolac calculator odds of success 23% (pt says last c/s for arrest of second stage). With this information patient elected repeat cesarean. The risks of cesarean section were discussed with the patient including but were not limited to: bleeding which may require transfusion or reoperation; infection which may require antibiotics; injury to bowel, bladder, ureters or other surrounding organs; injury to the fetus; need for additional procedures including hysterectomy in the event of a life-threatening hemorrhage; placental abnormalities wth subsequent pregnancies, incisional problems, thromboembolic phenomenon and other postoperative/anesthesia complications.  Patient has been NPO since  midnight she will remain NPO for procedure. Anesthesia and OR aware.  Preoperative prophylactic antibiotics and SCDs ordered on call to the OR.  To OR when ready.  # gHTN: new diagnosis this admission, bp elevated earlier this month and on admission. bp not currently severe, patient is asymptomatic. Will check cbc, cmp, upc  # HIV: new dx this pregnancy, presumably from husband. He has told her he has tested negative; ID has advised he get re-tested and he has not done so. Pt compliant w/ home tivicay and truvada. Viral load undetectable 2 months ago. Per institution protocol will receive AZT prior to c/s today. Will continue tivicay and truvada PP and will need ID f/u. Will check viral load today.   # A2GDM: growth normal, glucose wnl this morning.  #Pain: spinal #FWB: FHTs wnl #ID:  gbs unk, no indication for tx #MOF: bottle #MOC: condoms #Circ:  declines  Miranda George 05/02/2020, 9:01 AM   \

## 2020-05-02 NOTE — Anesthesia Postprocedure Evaluation (Signed)
Anesthesia Post Note  Patient: Miranda George  Procedure(s) Performed: CESAREAN SECTION (N/A )     Patient location during evaluation: PACU Anesthesia Type: Spinal Level of consciousness: oriented and awake and alert Pain management: pain level controlled Vital Signs Assessment: post-procedure vital signs reviewed and stable Respiratory status: spontaneous breathing, respiratory function stable and patient connected to nasal cannula oxygen Cardiovascular status: blood pressure returned to baseline and stable Postop Assessment: no headache, no backache and no apparent nausea or vomiting Anesthetic complications: no   No complications documented.  Last Vitals:  Vitals:   05/02/20 1239 05/02/20 1440  BP: (!) 146/83 (!) 143/70  Pulse: (!) 58 62  Resp: 16 16  Temp: 36.5 C 36.6 C  SpO2: 100% 98%    Last Pain:  Vitals:   05/02/20 1440  TempSrc: Oral  PainSc: 7    Pain Goal:                   Miranda George S

## 2020-05-02 NOTE — Discharge Summary (Addendum)
Postpartum Discharge Summary  Date of Service updated     Patient Name: Miranda George DOB: [birth year] MRN: [id number]  Date of admission: 05/02/2020 Delivery date:05/02/2020  Delivering provider: Laurey Arrow [place]  Date of discharge: 05/05/2020  Admitting diagnosis: History of cesarean section [Z98.891] Intrauterine pregnancy: 104w0d    Secondary diagnosis:  Active Problems:   HIV disease affecting pregnancy   Gestational diabetes   Cholestasis of pregnancy, antepartum   History of cesarean section  Additional problems: None  Discharge diagnosis: Term Pregnancy Delivered, Gestational Hypertension and GDM A1                                              Post partum procedures:None Augmentation: N/A Complications: None  Hospital course: Sceduled C/S   40y.o. yo G3P1011 at 316w0das admitted to the hospital 05/02/2020 for scheduled cesarean section with the following indication:Elective Repeat.Delivery details are as follows:  Membrane Rupture Time/Date: 10:28 AM ,05/02/2020   Delivery Method:C-Section, Vacuum Assisted  Details of operation can be found in separate operative note.  Patient had an uncomplicated postpartum course.  She is ambulating, tolerating a regular diet, passing flatus, and urinating well. Patient is discharged home in stable condition on  05/05/20        Newborn Data: Birth date:05/02/2020  Birth time:10:29 AM  Gender:Female  Living status:Living  Apgars:9 ,9  Weight:3215 g     Magnesium Sulfate received: No BMZ received: No Rhophylac:N/A MMR:N/A T-DaP:Given prenatally Flu: Yes Transfusion:No  Physical exam  Vitals:   05/04/20 1018 05/04/20 1507 05/04/20 2310 05/05/20 0517  BP: 122/74 123/76 138/82 136/87  Pulse: 72 72 79 74  Resp: _0 Temp: 98.4 F (36.9 C) 98.2 F (36.8 C) 98 F (36.7 C) 98 F (36.7 C)  TempSrc: Oral Oral Oral Oral  SpO2: 100% 99%    Weight:      Height:       General: alert, cooperative and no  distress Lochia: appropriate Uterine Fundus: firm Incision: Healing well with no significant drainage, No significant erythema, Dressing is clean, dry, and intact DVT Evaluation: No evidence of DVT seen on physical exam. Labs: Lab Results  Component Value Date   WBC 6.6 05/03/2020   HGB 8.5 (L) 05/03/2020   HCT 26.9 (L) 05/03/2020   MCV 82.8 05/03/2020   PLT 228 05/03/2020   CMP Latest Ref Rng & Units 05/03/2020  Glucose 70 - 99 mg/dL 99  BUN 6 - 20 mg/dL -  Creatinine 0.44 - 1.00 mg/dL -  Sodium 135 - 145 mmol/L -  Potassium 3.5 - 5.1 mmol/L -  Chloride 98 - 111 mmol/L -  CO2 22 - 32 mmol/L -  Calcium 8.9 - 10.3 mg/dL -  Total Protein 6.5 - 8.1 g/dL -  Total Bilirubin 0.3 - 1.2 mg/dL -  Alkaline Phos 38 - 126 U/L -  AST 15 - 41 U/L -  ALT 0 - 44 U/L -   Edinburgh Score: Edinburgh Postnatal Depression Scale Screening Tool 05/04/2020  I have been able to laugh and see the funny side of things. 3  I have looked forward with enjoyment to things. 0  I have blamed myself unnecessarily when things went wrong. 0  I have been anxious or worried for no good reason. 0  I have felt scared or panicky for  no good reason. 0  Things have been getting on top of me. 1  I have been so unhappy that I have had difficulty sleeping. 0  I have felt sad or miserable. 0  I have been so unhappy that I have been crying. 0  The thought of harming myself has occurred to me. 0  Edinburgh Postnatal Depression Scale Total 4     After visit meds:  Allergies as of 05/05/2020   No Known Allergies     Medication List    STOP taking these medications   Accu-Chek Guide w/Device Kit   Accu-Chek Softclix Lancets lancets     TAKE these medications   acetaminophen 500 MG tablet Commonly known as: TYLENOL Take 2 tablets (1,000 mg total) by mouth every 8 (eight) hours.   alum & mag hydroxide-simeth 200-200-20 MG/5ML suspension Commonly known as: MAALOX/MYLANTA Take 15-30 mLs by mouth every 6 (six)  hours as needed for indigestion or heartburn.   coconut oil Oil Apply 1 application topically as needed.   diphenhydrAMINE 2 % cream Commonly known as: BENADRYL Apply 1 application topically 3 (three) times daily as needed for itching.   emtricitabine-tenofovir 200-300 MG tablet Commonly known as: TRUVADA TAKE 1 TABLET BY MOUTH DAILY. What changed: when to take this   glucose blood test strip Use as instructed   ibuprofen 400 MG tablet Commonly known as: ADVIL Take 1 tablet (400 mg total) by mouth every 6 (six) hours.   metFORMIN 500 MG tablet Commonly known as: Glucophage Take 1 tablet (500 mg total) by mouth at bedtime. What changed: when to take this   multivitamin-prenatal 27-0.8 MG Tabs tablet Take 1 tablet by mouth in the morning.   oxyCODONE 5 MG immediate release tablet Commonly known as: Oxy IR/ROXICODONE Take 1-2 tablets (5-10 mg total) by mouth every 4 (four) hours as needed for moderate pain or severe pain.   polyethylene glycol 17 g packet Commonly known as: MIRALAX / GLYCOLAX Take 17 g by mouth daily.   simethicone 80 MG chewable tablet Commonly known as: MYLICON Chew 1 tablet (80 mg total) by mouth 3 (three) times daily after meals.   Tivicay 50 MG tablet Generic drug: dolutegravir TAKE 1 TABLET (50 MG TOTAL) BY MOUTH DAILY. What changed:   how much to take  when to take this   ursodiol 500 MG tablet Commonly known as: ACTIGALL Take 1 tablet (500 mg total) by mouth in the morning and at bedtime.        Discharge home in stable condition Infant Feeding: Breast Infant Disposition:home with mother Discharge instruction: per After Visit Summary and Postpartum booklet. Activity: Advance as tolerated. Pelvic rest for 6 weeks.  Diet: routine diet Future Appointments: Future Appointments  Date Time Provider [place]  05/12/2020  9:00 AM [facility] NURSE [facility]  05/30/2020  8:15 AM Chancy Milroy, MD [facility]  06/06/2020  9:30  AM [facility]  06/20/2020  9:30 AM Vu, Rockey Situ, MD [facility]   Follow up Visit:   Please schedule this patient for a In person postpartum visit in 4 weeks with the following provider: MD. Additional Postpartum F/U:2 hour GTT in four weeks, Incision check 1 week, BP check 1 week and HIV follow up  High risk pregnancy complicated by: GDM, HTN and HIV Delivery mode:  C-Section, Vacuum Assisted  Anticipated Birth Control:  Condoms  Attestation of Supervision of Student:  I confirm that I have verified the information documented in  the resident student's note and that I have also personally reperformed the history, physical exam and all medical decision making activities.  I have verified that all services and findings are accurately documented in this student's note; and I agree with management and plan as outlined in the documentation. I have also made any necessary editorial changes.  -Patient dressing clean, dry and intact.  -continue home HIV meds -Patient has follow up appt in may with ID and infant has follow [facility] app[facility] at [facility] for Pediatric infectious disease -discontinue ursodial  Starr Lake, [place] for [facility] 05/05/2020 12:08 PM  05/05/2020 Starr Lake, CNM

## 2020-05-02 NOTE — Anesthesia Preprocedure Evaluation (Signed)
Anesthesia Evaluation  Patient identified by MRN, date of birth, ID band Patient awake    Reviewed: Allergy & Precautions, H&P , NPO status , Patient's Chart, lab work & pertinent test results  Airway Mallampati: II   Neck ROM: full    Dental   Pulmonary neg pulmonary ROS,    breath sounds clear to auscultation       Cardiovascular hypertension,  Rhythm:regular Rate:Normal     Neuro/Psych    GI/Hepatic GERD  ,  Endo/Other  diabetesMorbid obesity  Renal/GU      Musculoskeletal   Abdominal   Peds  Hematology  (+) Blood dyscrasia, anemia , HIV,   Anesthesia Other Findings   Reproductive/Obstetrics (+) Pregnancy H/o prior C/S                             Anesthesia Physical Anesthesia Plan  ASA: II  Anesthesia Plan: Spinal   Post-op Pain Management:    Induction: Intravenous  PONV Risk Score and Plan: 2 and Ondansetron, Dexamethasone and Treatment may vary due to age or medical condition  Airway Management Planned: Simple Face Mask  Additional Equipment:   Intra-op Plan:   Post-operative Plan:   Informed Consent: I have reviewed the patients History and Physical, chart, labs and discussed the procedure including the risks, benefits and alternatives for the proposed anesthesia with the patient or authorized representative who has indicated his/her understanding and acceptance.     Dental advisory given  Plan Discussed with: CRNA, Anesthesiologist and Surgeon  Anesthesia Plan Comments:         Anesthesia Quick Evaluation

## 2020-05-02 NOTE — Anesthesia Procedure Notes (Signed)
Spinal  Patient location during procedure: OR Start time: 05/02/2020 10:02 AM End time: 05/02/2020 10:06 AM Reason for block: surgical anesthesia Staffing Performed: anesthesiologist  Anesthesiologist: Albertha Ghee, MD Preanesthetic Checklist Completed: patient identified, IV checked, risks and benefits discussed, surgical consent, monitors and equipment checked, pre-op evaluation and timeout performed Spinal Block Patient position: sitting Prep: DuraPrep Patient monitoring: cardiac monitor, continuous pulse ox and blood pressure Approach: midline Location: L3-4 Injection technique: single-shot Needle Needle type: Pencan  Needle gauge: 24 G Needle length: 9 cm Assessment Sensory level: T10 Events: CSF return Additional Notes Functioning IV was confirmed and monitors were applied. Sterile prep and drape, including hand hygiene and sterile gloves were used. The patient was positioned and the spine was prepped. The skin was anesthetized with lidocaine.  Free flow of clear CSF was obtained prior to injecting local anesthetic into the CSF.  The spinal needle aspirated freely following injection.  The needle was carefully withdrawn.  The patient tolerated the procedure well.

## 2020-05-02 NOTE — Op Note (Signed)
Operative Note   SURGERY DATE: 05/02/2020  PRE-OP DIAGNOSIS:  *Pregnancy at [place] * history of one prior c section * HIV positive  * Gestational Diabetes  * Cholestasis of Pregnancy  POST-OP DIAGNOSIS: Same   PROCEDURE: repeat low transverse cesarean section via pfannenstiel skin incision with double layer uterine closure   SURGEON: Surgeon(s) and Role:    * Wouk, Ailene Rud, MD - Primary    * Cammeron Greis, Placido Sou, MD - Assisting    * Zoye Chandra, Placido Sou, MD - Fellow    ANESTHESIA: spinal  ESTIMATED BLOOD LOSS: 390  DRAINS: 281mL UOP via indwelling foley  TOTAL IV FLUIDS: 1570mL crystalloid  VTE PROPHYLAXIS: SCDs to bilateral lower extremities  ANTIBIOTICS: Two grams of Cefazolin were given., within 1 hour of skin incision  SPECIMENS: placenta to L&D  COMPLICATIONS: none  INDICATIONS: history of one prior cesarean section, declines vaginal attempt   FINDINGS: No intra-abdominal adhesions were noted. Grossly normal uterus, tubes and ovaries. Clear amniotic fluid, cephalic female infant, weight pending, APGARs 9/9, intact placenta.  PROCEDURE IN DETAIL: The patient was taken to the operating room where anesthesia was administered and normal fetal heart tones were confirmed. She was then prepped and draped in the normal fashion in the dorsal supine position with a leftward tilt.  After a time out was performed, a pfannensteil  skin incision was made with the scalpel and carried through to the underlying layer of fascia. The fascia was then incised at the midline and this incision was extended laterally with the mayo scissors. Attention was turned to the superior aspect of the fascial incision which was grasped with the kocher clamps x 2, tented up and the rectus muscles were dissected off bluntly. In a similar fashion the inferior aspect of the fascial incision was grasped with the kocher clamps, tented up and the rectus muscles dissected off with the mayo scissors. The rectus muscles  were then separated in the midline and the peritoneum was entered bluntly. The alexis retractor was inserted and the vesicouterine peritoneum was identified, tented up and entered with the metzenbaum scissors. This incision was extended laterally and the bladder flap was created digitally.    A low transverse hysterotomy was made with the scalpel until the endometrial cavity was breached and the amniotic sac ruptured with the Allis clamp, yielding clear amniotic fluid. This incision was extended bluntly and the infant's head was brought up to hysterotomy and delivered with one pull with vaccuum, shoulders and body were delivered atraumatically.The cord was clamped x 2 and cut, and the infant was handed to the awaiting pediatricians, after delayed cord clamping was done.  The placenta was then gradually expressed from the uterus and then the uterus was exteriorized and cleared of all clots and debris. The hysterotomy was repaired with a running suture of 1-0 vicryl. A second imbricating layer of 1-0 vicryl suture was then placed. Several figure-of-eight sutures of vicryl were added to achieve excellent hemostasis. A 3x3 cm hematoma was noted by left aspect of hysterotomy but was stable and not bleeding.    The uterus and adnexa were then returned to the abdomen, and the hysterotomy and all operative sites were reinspected and excellent hemostasis was noted after irrigation and suction of the abdomen with warm saline.  The peritoneum was closed with a running stitch of 3-0 Vicryl. The fascia was reapproximated with 2-0 Vicryl in a simple running fashion bilaterally. The subcutaneous layer was then reapproximated with interrupted sutures of 2-0 plain gut,  and the skin was then closed with 4-0 vicryl, in a subcuticular fashion.  The patient  tolerated the procedure well. Sponge, lap, needle, and instrument counts were correct x 2. The patient was transferred to the recovery room awake, alert and breathing  independently in stable condition.   Sharene Skeans, MD [facility] Family Medicine Fellow, [facility] for [facility], [place]

## 2020-05-02 NOTE — Transfer of Care (Signed)
Immediate Anesthesia Transfer of Care Note  Patient: Miranda George  Procedure(s) Performed: CESAREAN SECTION (N/A )  Patient Location: PACU  Anesthesia Type:Spinal  Level of Consciousness: awake, alert  and oriented  Airway & Oxygen Therapy: Patient Spontanous Breathing  Post-op Assessment: Report given to RN and Post -op Vital signs reviewed and stable  Post vital signs: Reviewed and stable  Last Vitals:  Vitals Value Taken Time  BP 104/72 05/02/20 1116  Temp    Pulse 76 05/02/20 1117  Resp 22 05/02/20 1117  SpO2 97 % 05/02/20 1117  Vitals shown include unvalidated device data.  Last Pain:  Vitals:   05/02/20 0634  TempSrc: Oral         Complications: No complications documented.

## 2020-05-03 LAB — CBC
HCT: 26.9 % — ABNORMAL LOW (ref 36.0–46.0)
Hemoglobin: 8.5 g/dL — ABNORMAL LOW (ref 12.0–15.0)
MCH: 26.2 pg (ref 26.0–34.0)
MCHC: 31.6 g/dL (ref 30.0–36.0)
MCV: 82.8 fL (ref 80.0–100.0)
Platelets: 228 10*3/uL (ref 150–400)
RBC: 3.25 MIL/uL — ABNORMAL LOW (ref 3.87–5.11)
RDW: 13.7 % (ref 11.5–15.5)
WBC: 6.6 10*3/uL (ref 4.0–10.5)
nRBC: 0 % (ref 0.0–0.2)

## 2020-05-03 LAB — HIV-1 RNA QUANT-NO REFLEX-BLD
HIV 1 RNA Quant: <20 copies/mL
LOG10 HIV-1 RNA: UNDETERMINED log10copy/mL

## 2020-05-03 LAB — GLUCOSE, CAPILLARY: Glucose-Capillary: 104 mg/dL — ABNORMAL HIGH (ref 70–99)

## 2020-05-03 LAB — GLUCOSE, RANDOM: Glucose, Bld: 99 mg/dL (ref 70–99)

## 2020-05-03 MED ORDER — ACETAMINOPHEN 500 MG PO TABS
1000.0000 mg | ORAL_TABLET | Freq: Three times a day (TID) | ORAL | Status: DC
Start: 2020-05-03 — End: 2020-05-05
  Administered 2020-05-03 – 2020-05-05 (×6): 1000 mg via ORAL
  Filled 2020-05-03 (×6): qty 2

## 2020-05-03 MED ORDER — NIFEDIPINE ER OSMOTIC RELEASE 30 MG PO TB24
30.0000 mg | ORAL_TABLET | Freq: Every day | ORAL | Status: DC
  Administered 2020-05-03: 30 mg via ORAL
  Filled 2020-05-03: qty 1

## 2020-05-03 MED ORDER — IBUPROFEN 200 MG PO TABS
400.0000 mg | ORAL_TABLET | Freq: Four times a day (QID) | ORAL | Status: DC
  Administered 2020-05-03 – 2020-05-05 (×9): 400 mg via ORAL
  Filled 2020-05-03 (×10): qty 2

## 2020-05-03 MED ORDER — SODIUM CHLORIDE 0.9 % IV SOLN
500.0000 mg | Freq: Once | INTRAVENOUS | Status: AC
  Administered 2020-05-03: 500 mg via INTRAVENOUS
  Filled 2020-05-03: qty 25

## 2020-05-03 NOTE — Progress Notes (Addendum)
Subjective: Postpartum Day 1: Cesarean Delivery  Patient is doing well without complaints. Ambulating without difficulty. Voiding and passing flatus. Tolerating PO. Abdominal pain improved. Vaginal bleeding decreased.  Objective: Vital signs in last 24 hours: Temp:  [97.5 F (36.4 C)-98.7 F (37.1 C)] 97.9 F (36.6 C) (04/19 0300) Pulse Rate:  [53-95] 64 (04/19 0300) Resp:  [10-22] 16 (04/19 0300) BP: (104-151)/(65-90) 142/73 (04/19 0300) SpO2:  [97 %-100 %] 98 % (04/19 0300) Weight:  [102.1 kg] 102.1 kg (04/18 0634)  Physical Exam:  General: alert, cooperative and no distress Lochia: appropriate Uterine Fundus: firm Incision: prevena wound vac in place DVT Evaluation: No evidence of DVT seen on physical exam.  Recent Labs    05/02/20 0851 05/03/20 0407  HGB 9.7* 8.5*  HCT 30.4* 26.9*    Assessment/Plan: Status post Cesarean section. Doing well postoperatively. #HIV positive: Plan for postpartum ID follow up. Continue home tivicay and truvada. #Acute blood loss anemia: hgb 9.7 > 8.5 - administer venofer. #A2GDM: PPD#1 BG 104. Recheck A1c at post partum visit.  #gHTN: BP stable in mild range. No other signs or symptoms concerning for PEC. Started on Procardia.  #Cholestasis: symptoms resolved. #Circ: declined. #Continue current care.  Miranda George 05/03/2020, 6:27 AM  Attestation of Supervision of Student:  I confirm that I have verified the information documented in the medical student's note and that I have also personally reperformed the history, physical exam and all medical decision making activities.  I have verified that all services and findings are accurately documented in this student's note; and I agree with management and plan as outlined in the documentation. I have also made any necessary editorial changes.  Randa Ngo, [place] for [facility], [place] [facility] 05/03/2020 8:44 AM

## 2020-05-03 NOTE — Clinical Social Work Maternal (Signed)
CLINICAL SOCIAL WORK MATERNAL/CHILD NOTE  Patient Details  Name: Myrtie Gossett MRN: [id number] Date of Birth: 11/11/1980  Date:  05/03/2020  Clinical Social Worker Initiating Note:  Tadarrius Burch, MSW, LCSWA Date/Time: Initiated:  05/03/20/0905     Child's Name:  Derick Ramirez   Biological Parents:  Mother,Father (Pedro Ramirez)   Need for Interpreter:  Spanish   Reason for Referral:  Newly Diagnosed HIV   Address:  [address] [place] [address]    Phone number:  [phone] (home)     Additional phone number:   Household Members/Support Persons (HM/SP):   Household Member/Support Person 1,Household Member/Support Person 2   HM/SP Name Relationship DOB or Age  HM/SP -1 Pedro Ramirez Spouse 03/07/1980  HM/SP -2 Nathan Ramirez Son 09/28/2010  HM/SP -3        HM/SP -4        HM/SP -5        HM/SP -6        HM/SP -7        HM/SP -8          Natural Supports (not living in the home):  Immediate Family   Professional Supports: None   Employment: Unemployed   Type of Work:     Education:  High school graduate   Homebound arranged:    Financial Resources:  Medicaid   Other Resources:  Food Stamps ,WIC   Cultural/Religious Considerations Which May Impact Care:    Strengths:  Ability to meet basic needs ,Home prepared for child ,Understanding of illness   Psychotropic Medications:         Pediatrician:       Pediatrician List:   [place]    [place]    [place]    [place]    [place]    [place]      Pediatrician Fax Number:    Risk Factors/Current Problems:  None   Cognitive State:  Alert ,Insightful ,Linear Thinking    Mood/Affect:  Interested ,Calm ,Relaxed    CSW Assessment: CSW consulted to establish specialtiy care for infant upon discharge. CSW met with MOB to complete assessment. CSW used interpreter Karina #[id number]. CSW introduced self and role. CSW observed infant sleeping in  bassinet and FOB on couch. CSW requested to speak with MOB alone. FOB was understanding and exited the room. MOB was engaged and receptive to the visit. CSW informed MOB of reason for consult and assessed current mood. MOB reported she is currently feeling good. MOB disclosed she had a "kind of hard pregnancy, due to complications." CSW asked MOB how she is handing her diagnosis. MOB expressed she is doing well now. MOB shared she experienced some depression when she was first diagnosed and would cry sometimes. MOB stated her son would ask "what's wrong, which motivated her to get better."  CSW asked MOB if FOB is aware of the diagnosis. MOB reported "he told me he had a negative test, but he was supposed to be get retested and hasn't yet." CSW expressed understanding and informed MOB of the policy surrounding establishing an Infection Disease appointment for infant. MOB was understanding and agreeable to have an appointment set up at [facility].   MOB reported she resides with FOB and their older son. MOB stated their son is currently being watched by [facility]. MOB is unemployed, receiving WIC and food stamp resources. CSW asked MOB if she has adequate food for the home. MOB reported "we do now," but shared   her food stamps were reduced. CSW encouraged MOB to contact DSS to see if infant can be added to the benefits. MOB stated she also has an appointment for WIC. MOB denies any mental health diagnosis and identified FOB and her parents as supports. MOB denies any current SI, HI or being involved in DV.   CSW provided education regarding the baby blues period versus PPD. CSW provided the New Mom Checklist and encouraged MOB to self evaluate and contact a medical professional if symptoms are noted at any time.  CSW provided review of Sudden Infant Death Syndrome (SIDS) precautions. MOB reported she has all essentials for infant, including a crib. MOB is still in the process of choosing a pediatrician and denies any  barriers to care. MOB expressed she has no additional needs at this time.     CSW will provide MOB with appointment details once they have been confirmed.  CSW identifies no further need for intervention and no barriers to discharge at this time.  CSW Plan/Description:  No Further Intervention Required/No Barriers to Discharge,Perinatal Mood and Anxiety Disorder (PMADs) Education,Sudden Infant Death Syndrome (SIDS) Education,Other Patient/Family Education,Other Information/Referral to Community Resources    Khyle Goodell J Trystin Terhune, LCSWA 05/03/2020, 10:32 AM  

## 2020-05-03 NOTE — Progress Notes (Signed)
Reviewed chart (MAR) and concur with student's documentation.

## 2020-05-04 MED ORDER — OXYCODONE HCL 5 MG PO TABS
5.0000 mg | ORAL_TABLET | ORAL | Status: DC | PRN
  Administered 2020-05-04: 10 mg via ORAL
  Filled 2020-05-04: qty 2

## 2020-05-04 MED ORDER — NIFEDIPINE ER OSMOTIC RELEASE 30 MG PO TB24
60.0000 mg | ORAL_TABLET | Freq: Every day | ORAL | Status: DC
  Administered 2020-05-04 – 2020-05-05 (×2): 60 mg via ORAL
  Filled 2020-05-04 (×2): qty 2

## 2020-05-04 MED ORDER — POLYETHYLENE GLYCOL 3350 17 G PO PACK
17.0000 g | PACK | Freq: Every day | ORAL | Status: DC
  Administered 2020-05-04: 17 g via ORAL
  Filled 2020-05-04: qty 1

## 2020-05-04 NOTE — Progress Notes (Addendum)
Subjective: Postpartum Day 2: Cesarean Delivery  Patient is doing well. Only concern is constipation. Ambulating without difficulty. Voiding and passing flatus. Tolerating PO. Abdominal pain improved. Vaginal bleeding decreased, described as moderate period.  Objective: Vital signs in last 24 hours: Temp:  [97.7 F (36.5 C)-98.1 F (36.7 C)] 97.7 F (36.5 C) (04/20 0553) Pulse Rate:  [63-68] 66 (04/20 0553) Resp:  [16-20] 18 (04/20 0553) BP: (119-150)/(62-72) 150/71 (04/20 0553) SpO2:  [99 %] 99 % (04/19 1950)  Physical Exam:  General: alert, cooperative and no distress Lochia: appropriate Uterine Fundus: firm Incision: clear, dry, intact DVT Evaluation: No evidence of DVT seen on physical exam.  Recent Labs    05/02/20 0851 05/03/20 0407  HGB 9.7* 8.5*  HCT 30.4* 26.9*    Assessment/Plan: Status post Cesarean section. Doing well postoperatively.  #gHTN: Increased Procardia to 60mg  daily due to BP remaining elevated. No other signs or symptoms concerning for PEC.  #Constipation: start on MiraLax. #HIV positive: ID aware of plan s/p delivery. Continue home tivicay and truvada. #Acute blood loss anemia: hgb 8.5 - s/p venofer. Plan to continue po iron on discharge. #A2GDM: PPD#1 BG 104. Recheck gtt at post partum visit. #Circ: declined. #Continue current care. Plan to discharge on POD#3 per pt preference.  [name] 05/04/2020, 6:26 AM  Attestation of Supervision of Student:  I confirm that I have verified the information documented in the medical student's note and that I have also personally reperformed the history, physical exam and all medical decision making activities.  I have verified that all services and findings are accurately documented in this student's note; and I agree with management and plan as outlined in the documentation. I have also made any necessary editorial changes.  Randa Ngo, [place] for [facility], [place]  [facility] 05/04/2020 9:37 AM

## 2020-05-05 MED ORDER — ACETAMINOPHEN 500 MG PO TABS: 1000.0000 mg | ORAL_TABLET | Freq: Three times a day (TID) | ORAL | 0 refills | Status: AC

## 2020-05-05 MED ORDER — COCONUT OIL OIL: 1.0000 "application " | TOPICAL_OIL | 0 refills | Status: DC | PRN

## 2020-05-05 MED ORDER — OXYCODONE HCL 5 MG PO TABS: 5.0000 mg | ORAL_TABLET | ORAL | 0 refills | Status: DC | PRN

## 2020-05-05 MED ORDER — IBUPROFEN 400 MG PO TABS: 400.0000 mg | ORAL_TABLET | Freq: Four times a day (QID) | ORAL | 0 refills | Status: DC

## 2020-05-05 MED ORDER — SIMETHICONE 80 MG PO CHEW: 80.0000 mg | CHEWABLE_TABLET | Freq: Three times a day (TID) | ORAL | 0 refills | Status: AC

## 2020-05-05 MED ORDER — POLYETHYLENE GLYCOL 3350 17 G PO PACK: 17.0000 g | PACK | Freq: Every day | ORAL | 0 refills | Status: DC

## 2020-05-05 NOTE — Progress Notes (Signed)
Order was placed for babyscripts blood pressure monitoring app. Patient not receiving e-mail providing pass code to set up account on app and was not using app prior to delivery. Called Benedict, CNM, regarding next steps to get patient set up. Patient already has blood pressure cuff that was given during this hospital stay to begin set up. Threasa Beards stated that she would have the ob office call patient to get her set up with the app postpartum. Discussed with patient's RN in order to discuss with patient during discharge paperwork.  Maxwell Caul, Leretha Dykes [place]

## 2020-05-06 ENCOUNTER — Telehealth: Payer: Self-pay | Admitting: *Deleted

## 2020-05-06 NOTE — Telephone Encounter (Signed)
Transition Care Management Unsuccessful Follow-up Telephone Call  Date of discharge and from where:  05/05/2020 - [place]  Attempts:  1st Attempt  Reason for unsuccessful TCM follow-up call:  Left voice message via [place] language interpreter

## 2020-05-09 ENCOUNTER — Ambulatory Visit (INDEPENDENT_AMBULATORY_CARE_PROVIDER_SITE_OTHER): Payer: Medicaid Other | Admitting: General Practice

## 2020-05-09 ENCOUNTER — Other Ambulatory Visit: Payer: Self-pay

## 2020-05-09 VITALS — BP 136/73 | Ht <= 58 in | Wt 204.0 lb

## 2020-05-09 DIAGNOSIS — O133 Gestational [pregnancy-induced] hypertension without significant proteinuria, third trimester: Secondary | ICD-10-CM

## 2020-05-09 MED ORDER — NIFEDIPINE ER OSMOTIC RELEASE 30 MG PO TB24: 30.0000 mg | ORAL_TABLET | Freq: Every day | ORAL | 0 refills | Status: DC

## 2020-05-09 NOTE — Progress Notes (Signed)
Patient was assessed and managed by nursing staff during this encounter. I have reviewed the chart and agree with the documentation and plan. I have also made any necessary editorial changes.  Verita Schneiders, MD 05/09/2020 2:06 PM

## 2020-05-09 NOTE — Progress Notes (Signed)
Patient came into office today with concern that wound vac stopped working this morning and was told it should be working for another few days. Discussed with patient pump is fine to be removed today. Patient reports headaches & dizziness daily but tylenol/motrin helps. She reports difficulty getting into babyscripts and cannot figure it out. BP 136/73 today. No edema noted. Dressing/wound vac removed. Incision appears to be healing well, not completely approximated in middle of incision about 2 inches long. Steri strips applied over top. Wound care and signs & symptoms of infection reviewed with patient. Discussed headaches & BP with Dr Harolyn Rutherford who advises patient begin procardia 30mg  daily. Med ordered & sent to pharmacy. Fixed babyscripts sign on issue for patient and taught her how to input blood pressures. Recommended she wait to take blood pressure reading until 2 hours after taking medication. Told patient to call office with questions, concerns, or worsening symptoms.  Koren Bound RN BSN 05/09/20

## 2020-05-09 NOTE — Telephone Encounter (Signed)
Transition Care Management Unsuccessful Follow-up Telephone Call  Date of discharge and from where:  05/05/2020 - [place]  Attempts:  2nd Attempt  Reason for unsuccessful TCM follow-up call:  Left voice message via [place] language interpreter

## 2020-05-10 NOTE — Telephone Encounter (Signed)
Transition Care Management Unsuccessful Follow-up Telephone Call  Date of discharge and from where:  05/05/2020 - [place]  Attempts:  3rd Attempt  Reason for unsuccessful TCM follow-up call:  Left voice message via [place] language interpreter

## 2020-05-12 ENCOUNTER — Ambulatory Visit: Payer: Self-pay

## 2020-05-12 ENCOUNTER — Other Ambulatory Visit (HOSPITAL_COMMUNITY): Payer: Self-pay

## 2020-05-16 ENCOUNTER — Other Ambulatory Visit (HOSPITAL_COMMUNITY): Payer: Self-pay

## 2020-05-16 ENCOUNTER — Other Ambulatory Visit: Payer: Self-pay | Admitting: Pharmacist

## 2020-05-16 DIAGNOSIS — B20 Human immunodeficiency virus [HIV] disease: Secondary | ICD-10-CM

## 2020-05-16 MED ORDER — TIVICAY 50 MG PO TABS
ORAL_TABLET | Freq: Every day | ORAL | 2 refills | Status: DC
  Filled 2020-05-16 – 2020-05-17 (×2): qty 30, 30d supply, fill #0
  Filled 2020-06-09: qty 30, 30d supply, fill #1

## 2020-05-16 MED ORDER — EMTRICITABINE-TENOFOVIR DF 200-300 MG PO TABS
1.0000 | ORAL_TABLET | Freq: Every day | ORAL | 2 refills | Status: DC
  Filled 2020-05-16 – 2020-05-17 (×2): qty 30, 30d supply, fill #0
  Filled 2020-06-09: qty 30, 30d supply, fill #1

## 2020-05-17 ENCOUNTER — Other Ambulatory Visit (HOSPITAL_COMMUNITY): Payer: Self-pay

## 2020-05-20 ENCOUNTER — Ambulatory Visit: Payer: Medicaid Other

## 2020-05-23 ENCOUNTER — Other Ambulatory Visit (HOSPITAL_COMMUNITY): Payer: Self-pay

## 2020-05-24 ENCOUNTER — Ambulatory Visit (INDEPENDENT_AMBULATORY_CARE_PROVIDER_SITE_OTHER): Payer: Medicaid Other

## 2020-05-24 VITALS — BP 139/97 | HR 52

## 2020-05-24 DIAGNOSIS — O133 Gestational [pregnancy-induced] hypertension without significant proteinuria, third trimester: Secondary | ICD-10-CM

## 2020-05-24 DIAGNOSIS — O139 Gestational [pregnancy-induced] hypertension without significant proteinuria, unspecified trimester: Secondary | ICD-10-CM

## 2020-05-24 DIAGNOSIS — Z3A00 Weeks of gestation of pregnancy not specified: Secondary | ICD-10-CM

## 2020-05-24 MED ORDER — NIFEDIPINE ER OSMOTIC RELEASE 60 MG PO TB24: 60.0000 mg | ORAL_TABLET | Freq: Every day | ORAL | 0 refills | Status: DC

## 2020-05-24 NOTE — Progress Notes (Signed)
I agree with the findings and care plan as documented in the RN note.    Kerolos Nehme, MD OB Family Medicine Fellow, [facility], [facility]  

## 2020-05-24 NOTE — Progress Notes (Signed)
Wrong encounter for this same pt. Copied and placed same notes on pts chart from 05/24/20.

## 2020-05-24 NOTE — Progress Notes (Signed)
Janet Berlin, MDPhysician 11:18 AM     Edit           I agree with the findings and care plan as documented in the RN note.   Sharene Skeans, MD [facility] Family Medicine Fellow, [facility] for [facility] [place] [facility]                       My Note 9:39 AM     Edit           Called pt with [place] interpreter. Pt states slept through appt, but is able to take BP at home. Pt states having intermittent blurry vision and sometimes a headache that relieves with Ibuprofen.   BP 148/87 52 , but pt was laying to sitting position. Advised to retake BP after sitting for 10 minutes : BP 139/97 52. Reviewed with Dr Berniece Andreas and advised pt to begin taking 60 mg of Procardia daily. New Rx sent to pharmacy on file. Pt aware and agreeable to new plan of care and will take daily until PP visit on 5/16 with Dr Rip Harbour.   Pt also states having some redness and some burning on incision site since yesterday. Denies any drainage to site, fever, pain. Pt advised to continue to monitor until PP visit or can have OV for incision check. Pt agreeable to monitor for now. S/S of infection advised. Pt verbalized understanding.   Colletta Maryland, RN 05/24/20

## 2020-05-24 NOTE — Progress Notes (Signed)
Called pt with [place] interpreter. Pt states slept through appt, but is able to take BP at home. Pt states having intermittent blurry vision and sometimes a headache that relieves with Ibuprofen.   BP 148/87 52 , but pt was laying to sitting position. Advised to retake BP after sitting for 10 minutes : BP 139/97 52. Reviewed with Dr Berniece Andreas and advised pt to begin taking 60 mg of Procardia daily. New Rx sent to pharmacy on file. Pt aware and agreeable to new plan of care and will take daily until PP visit on 5/16 with Dr Rip Harbour.   Pt also states having some redness and some burning on incision site since yesterday. Denies any drainage to site, fever, pain. Pt advised to continue to monitor until PP visit or can have OV for incision check. Pt agreeable to monitor for now. S/S of infection advised. Pt verbalized understanding.   Colletta Maryland, RN 05/24/20

## 2020-05-26 ENCOUNTER — Other Ambulatory Visit (HOSPITAL_COMMUNITY): Payer: Self-pay

## 2020-05-30 ENCOUNTER — Ambulatory Visit (INDEPENDENT_AMBULATORY_CARE_PROVIDER_SITE_OTHER): Payer: Medicaid Other | Admitting: Obstetrics and Gynecology

## 2020-05-30 ENCOUNTER — Other Ambulatory Visit: Payer: Self-pay

## 2020-05-30 ENCOUNTER — Encounter: Payer: Self-pay | Admitting: Obstetrics and Gynecology

## 2020-05-30 VITALS — BP 115/71 | HR 65 | Wt 194.2 lb

## 2020-05-30 DIAGNOSIS — O133 Gestational [pregnancy-induced] hypertension without significant proteinuria, third trimester: Secondary | ICD-10-CM

## 2020-05-30 DIAGNOSIS — O24419 Gestational diabetes mellitus in pregnancy, unspecified control: Secondary | ICD-10-CM | POA: Diagnosis not present

## 2020-05-30 DIAGNOSIS — B20 Human immunodeficiency virus [HIV] disease: Secondary | ICD-10-CM

## 2020-05-30 DIAGNOSIS — Z789 Other specified health status: Secondary | ICD-10-CM

## 2020-05-30 NOTE — Patient Instructions (Signed)
Mantenimiento de Technical sales engineer en [place] Maintenance, Female Adoptar un estilo de vida saludable y recibir atencin preventiva son importantes para promover la salud y Musician. Consulte al mdico sobre:  El esquema adecuado para hacerse pruebas y exmenes peridicos.  Cosas que puede hacer por su cuenta para prevenir enfermedades y [employer]. Qu debo saber sobre la dieta, el peso y el ejercicio? Consuma una dieta saludable  Consuma una dieta que incluya muchas verduras, frutas, productos lcteos con bajo contenido de [place] y Advertising account planner.  No consuma muchos alimentos ricos en grasas slidas, azcares agregados o sodio.   Mantenga un peso saludable El ndice de masa muscular [facility]) se [place] para identificar problemas de [place]. Proporciona una estimacin de la grasa corporal basndose en el peso y la altura. Su mdico puede ayudarle a Radiation protection practitioner [place] y a Scientist, forensic o Theatre manager un peso saludable. Haga ejercicio con regularidad Haga ejercicio con regularidad. Esta es una de las prcticas ms importantes que puede hacer por su salud. La mayora de los adultos deben seguir estas pautas:  Optometrist, al menos, 133minutos de actividad fsica por semana. El ejercicio debe aumentar la frecuencia cardaca y Nature conservation officer transpirar (ejercicio de intensidad moderada).  Hacer ejercicios de fortalecimiento por lo [employer] por semana. Agregue esto a su plan de ejercicio de intensidad moderada.  Pasar menos tiempo sentados. Incluso la actividad fsica ligera puede ser beneficiosa. Controle sus niveles de colesterol y lpidos en la sangre Comience a realizarse anlisis de lpidos y Research officer, trade union en la sangre a los 20aos y luego reptalos cada 5aos. Hgase controlar los niveles de colesterol con mayor frecuencia si:  Sus niveles de lpidos y colesterol son altos.  Es mayor de 40aos.  Presenta un alto riesgo de padecer enfermedades cardacas. Qu debo saber sobre las pruebas de  deteccin del cncer? Segn su historia clnica y sus antecedentes familiares, es posible que deba realizarse pruebas de deteccin del cncer en diferentes edades. Esto puede incluir pruebas de deteccin de lo siguiente:  Cncer de mama.  Cncer de cuello uterino.  Cncer colorrectal.  Cncer de piel.  Cncer de pulmn. Qu debo saber sobre la enfermedad cardaca, la diabetes y la hipertensin arterial? Presin arterial y enfermedad cardaca  La hipertensin arterial causa enfermedades cardacas y [place] el riesgo de accidente cerebrovascular. Es ms probable que esto se manifieste en las personas que tienen lecturas de presin arterial alta, tienen ascendencia africana o tienen sobrepeso.  Hgase controlar la presin arterial: ? Cada 3 a 5 aos si tiene entre 18 y 63 aos. ? Todos los aos si es mayor de [place]. Diabetes Realcese exmenes de deteccin de la diabetes con regularidad. Este anlisis revisa el nivel de azcar en la sangre en [place]. Hgase las pruebas de deteccin:  Cada tresaos despus de los 52aos de edad si tiene un peso normal y un bajo riesgo de padecer diabetes.  Con ms frecuencia y a partir de [place] edad inferior si tiene sobrepeso o un alto riesgo de padecer diabetes. Qu debo saber sobre la prevencin de infecciones? Hepatitis B Si tiene un riesgo ms alto de contraer hepatitis B, debe someterse a un examen de deteccin de este virus. Hable con el mdico para averiguar si tiene riesgo de contraer la infeccin por hepatitis B. Hepatitis C Se recomienda el anlisis a:  [employer] 1945 y 1965.  Todas las personas que tengan un riesgo de haber contrado hepatitis C. Enfermedades de transmisin sexual (ETS)  Hgase  las pruebas de deteccin de ITS, incluidas la gonorrea y la clamidia, si: ? Es sexualmente activa y es menor de [place]. ? Es mayor de 24aos, y Investment banker, operational informa que corre riesgo de tener este tipo de  infecciones. ? La actividad sexual ha cambiado desde que le hicieron la ltima prueba de deteccin y tiene un riesgo mayor de Best boy clamidia o Radio broadcast assistant. Pregntele al mdico si usted tiene riesgo.  Pregntele al mdico si usted tiene un alto riesgo de Museum/gallery curator VIH. El mdico tambin puede recomendarle un medicamento recetado para ayudar a evitar la infeccin por el VIH. Si elige tomar medicamentos para prevenir el VIH, primero debe [employer] de deteccin del VIH. Luego debe hacerse anlisis cada 6meses mientras est tomando los medicamentos. Embarazo  Si est por dejar de Librarian, academic (fase premenopusica) y usted puede quedar [place], busque asesoramiento antes de [place].  Tome de 400 a [id number] (mcg) de cido [employer] si [place].  Pida mtodos de control de la natalidad (anticonceptivos) si desea evitar un embarazo no deseado. Osteoporosis y [place] La osteoporosis es una enfermedad en la que los huesos pierden los minerales y la fuerza por el avance de la edad. El resultado pueden ser fracturas en los [place]. Si tiene 65aos o ms, o si est en riesgo de sufrir osteoporosis y fracturas, pregunte a su mdico si debe:  Hacerse pruebas de deteccin de prdida sea.  Tomar un suplemento de calcio o de vitamina D para reducir el riesgo de fracturas.  Recibir terapia de reemplazo hormonal (TRH) para tratar los sntomas de la menopausia. Siga estas instrucciones en su casa: Estilo de vida  No consuma ningn producto que contenga nicotina o tabaco, como cigarrillos, cigarrillos electrnicos y tabaco de Higher education careers adviser. Si necesita ayuda para dejar de fumar, consulte al mdico.  No consuma drogas.  No comparta agujas.  Solicite ayuda a su mdico si necesita apoyo o informacin para abandonar las drogas. Consumo de alcohol  No beba alcohol si: ? Su mdico le indica no hacerlo. ? Est embarazada, puede estar embarazada o est tratando de quedar  embarazada.  Si bebe alcohol: ? Limite la cantidad que consume de 0 a 1 medida por da. ? Limite la ingesta si est amamantando.  Est atento a la cantidad de alcohol que hay en las bebidas que toma. En los [place], una medida equivale a una botella de cerveza de 12oz (362ml), un vaso de vino de 5oz (175ml) o un vaso de una bebida alcohlica de alta graduacin de 1oz (56ml). Instrucciones generales  Realcese los estudios de rutina de la salud, dentales y de Public librarian.  [place].  Infrmele a su mdico si: ? Se siente deprimida con frecuencia. ? Alguna vez ha sido vctima de [place] o no se siente segura en su casa. Resumen  Adoptar un estilo de vida saludable y recibir atencin preventiva son importantes para promover la salud y Musician.  Siga las instrucciones del mdico acerca de una dieta saludable, el ejercicio y la realizacin de pruebas o exmenes para Engineer, building services.  Siga las instrucciones del mdico con respecto al control del colesterol y la presin arterial. Esta informacin no tiene Marine scientist el consejo del mdico. Asegrese de hacerle al mdico cualquier pregunta que tenga. Document Revised: 01/22/2018 Document Reviewed: 01/22/2018 Elsevier Patient Education  [place].

## 2020-05-30 NOTE — Progress Notes (Signed)
    Post Partum Visit Note  Miranda George is a 40 y.o. G40P2012 female who presents for a postpartum visit. She is 4 weeks postpartum following a repeat cesarean section.  I have fully reviewed the prenatal and intrapartum course. The delivery was at 61 gestational weeks.  Anesthesia: epidural. Postpartum course has been complicated by [name]. Started on Procardia. Increased to 60 mg qd. Miranda George is doing well. Baby is feeding by bottle - Carnation Good Start DHA and ARA and Gerber Gentle. Bleeding staining only and brown. Bowel function is normal. Bladder function is normal. Patient is not sexually active. Contraception method is none. Postpartum depression screening: negative.   The pregnancy intention screening data noted above was reviewed. Potential methods of contraception were discussed. The patient elected to proceed with Female Condom.     Health Maintenance Due  Topic Date Due  . URINE MICROALBUMIN  Never done  . COVID-19 Vaccine (3 - Moderna risk 4-dose series) 09/11/2019    Medical record reviewed  Review of Systems Pertinent items are noted in HPI.  Objective:  LMP 07/24/2019    General:  alert   Breasts:  not indicated  Lungs: clear to auscultation bilaterally  Heart:  regular rate and rhythm, S1, S2 normal, no murmur, click, rub or gallop  Abdomen: soft, non-tender; bowel sounds normal; no masses,  no organomegaly, incision healing well  Wound well approximated incision  GU exam:  not indicated       Assessment:    There are no diagnoses linked to this encounter.  NL postpartum exam.  HTN GDM HIV  Plan:   Essential components of care per ACOG recommendations:  1.  Mood and well being: Patient with negative depression screening today. Reviewed local resources for support.  - Patient tobacco use? No.   - hx of drug use? No.    2. Infant care and feeding:  -Patient currently breastmilk feeding? No.  -Social determinants of health (SDOH) reviewed in  EPIC. No concerns  3. Sexuality, contraception and birth spacing - Patient does not want a pregnancy in the next year.  Desired family size is uncertain   - Reviewed forms of contraception in tiered fashion. Patient desired condoms today.   - Discussed birth spacing of 18 months  4. Sleep and fatigue -Encouraged family/partner/community support of 4 hrs of uninterrupted sleep to help with mood and fatigue  5. Physical Recovery  - Discussed patients delivery and complications. She describes her labor as good. - Patient had a C-section. - Patient has urinary incontinence? No. - Patient is safe to resume physical and sexual activity  6.  Health Maintenance - HM due items addressed  - Last pap smear No results found for: DIAGPAP Pap smear not done at today's visit.  -Breast Cancer screening indicated? No.   7. Chronic Disease/Pregnancy Condition follow up: Continue to see ID for HIV treatment  Pt does not have a PCP. Will stop Procardia and check BP in 2 weeks. Will also stop Metformin and check Glucola in 2 weeks as well  Video Interrupter used during today's visit  Miranda George, [place] for [employer], [place]

## 2020-06-06 ENCOUNTER — Other Ambulatory Visit: Payer: Self-pay

## 2020-06-06 ENCOUNTER — Other Ambulatory Visit: Payer: Medicaid Other

## 2020-06-06 DIAGNOSIS — B20 Human immunodeficiency virus [HIV] disease: Secondary | ICD-10-CM | POA: Diagnosis not present

## 2020-06-06 DIAGNOSIS — Z3490 Encounter for supervision of normal pregnancy, unspecified, unspecified trimester: Secondary | ICD-10-CM | POA: Diagnosis not present

## 2020-06-07 LAB — T-HELPER CELL (CD4) - (RCID CLINIC ONLY)
CD4 % Helper T Cell: 45 % (ref 33–65)
CD4 T Cell Abs: 527 /uL (ref 400–1790)

## 2020-06-08 LAB — COMPREHENSIVE METABOLIC PANEL
AG Ratio: 1.4 (calc) (ref 1.0–2.5)
ALT: 89 U/L — ABNORMAL HIGH (ref 6–29)
AST: 72 U/L — ABNORMAL HIGH (ref 10–30)
Albumin: 4 g/dL (ref 3.6–5.1)
Alkaline phosphatase (APISO): 91 U/L (ref 31–125)
BUN: 14 mg/dL (ref 7–25)
CO2: 26 mmol/L (ref 20–32)
Calcium: 9.1 mg/dL (ref 8.6–10.2)
Chloride: 107 mmol/L (ref 98–110)
Creat: 0.82 mg/dL (ref 0.50–1.10)
Globulin: 2.9 g/dL (calc) (ref 1.9–3.7)
Glucose, Bld: 102 mg/dL — ABNORMAL HIGH (ref 65–99)
Potassium: 4.3 mmol/L (ref 3.5–5.3)
Sodium: 139 mmol/L (ref 135–146)
Total Bilirubin: 0.2 mg/dL (ref 0.2–1.2)
Total Protein: 6.9 g/dL (ref 6.1–8.1)

## 2020-06-08 LAB — CBC
HCT: 37.1 % (ref 35.0–45.0)
Hemoglobin: 11.8 g/dL (ref 11.7–15.5)
MCH: 26.6 pg — ABNORMAL LOW (ref 27.0–33.0)
MCHC: 31.8 g/dL — ABNORMAL LOW (ref 32.0–36.0)
MCV: 83.6 fL (ref 80.0–100.0)
MPV: 9.7 fL (ref 7.5–12.5)
Platelets: 261 10*3/uL (ref 140–400)
RBC: 4.44 10*6/uL (ref 3.80–5.10)
RDW: 17.9 % — ABNORMAL HIGH (ref 11.0–15.0)
WBC: 4.1 10*3/uL (ref 3.8–10.8)

## 2020-06-08 LAB — HIV-1 RNA QUANT-NO REFLEX-BLD
HIV 1 RNA Quant: <20 Copies/mL — ABNORMAL HIGH
HIV-1 RNA Quant, Log: <1.3 Log cps/mL — ABNORMAL HIGH

## 2020-06-09 ENCOUNTER — Other Ambulatory Visit (HOSPITAL_COMMUNITY): Payer: Self-pay

## 2020-06-14 ENCOUNTER — Other Ambulatory Visit: Payer: Medicaid Other

## 2020-06-16 ENCOUNTER — Telehealth: Payer: Self-pay

## 2020-06-16 NOTE — Telephone Encounter (Signed)
   Miranda George DOB: July 14, 1980 MRN: [id number]   RIDER WAIVER AND RELEASE OF LIABILITY  For purposes of improving physical access to our facilities, [facility] is pleased to partner with third parties to provide [facility] patients or other authorized individuals the option of convenient, on-demand ground transportation services (the [place]") through use of the technology service that enables users to request on-demand ground transportation from independent third-party providers.  By opting to use and accept these [employer], I, the undersigned, hereby agree on behalf of myself, and on behalf of any minor child using the [employer] for whom I am the parent or legal guardian, as follows:  1. Government social research officer provided to me are provided by independent third-party transportation providers who are not [employer] or employees and who are unaffiliated with [employer]. 2. [facility] is neither a transportation carrier nor a common or public carrier. 3. [place] [facility] has no control over the quality or safety of the transportation that occurs as a result of the [employer]. 4. [facility] cannot guarantee that any third-party transportation provider will complete any arranged transportation service. 5. [facility] makes no representation, warranty, or guarantee regarding the reliability, timeliness, quality, safety, suitability, or availability of any of the Transport Services or that they will be error free. 6. I fully understand that traveling by vehicle involves risks and dangers of serious bodily injury, including permanent disability, paralysis, and death. I agree, on behalf of myself and on behalf of any minor child using the Transport Services for whom I am the parent or legal guardian, that the entire risk arising out of my use of the [employer] remains solely with me, to the maximum extent permitted under applicable law. 7. The  [employer] are provided "as is" and "as available." [facility] disclaims all representations and warranties, express, implied or statutory, not expressly set out in these terms, including the implied warranties of merchantability and fitness for a particular purpose. 8. I hereby waive and release [place], its agents, employees, officers, directors, representatives, insurers, attorneys, assigns, successors, subsidiaries, and affiliates from any and all past, present, or future claims, demands, liabilities, actions, causes of action, or suits of any kind directly or indirectly arising from acceptance and use of the [employer]. 9. I further waive and release  and its affiliates from all present and future liability and responsibility for any injury or death to persons or damages to property caused by or related to the use of the [employer]. 10. I have read this Waiver and Release of Liability, and I understand the terms used in it and their legal significance. This Waiver is freely and voluntarily given with the understanding that my right (as well as the right of any minor child for whom I am the parent or legal guardian using the [employer]) to legal recourse against  in connection with the [employer] is knowingly surrendered in return for use of these services.   I attest that I read the consent document to Miranda George, gave Miranda George the opportunity to ask questions and answered the questions asked (if any). I affirm that [place] then provided consent for she's participation in this program.     Miranda George

## 2020-06-20 ENCOUNTER — Encounter: Payer: Self-pay | Admitting: Internal Medicine

## 2020-06-20 ENCOUNTER — Other Ambulatory Visit (HOSPITAL_COMMUNITY): Payer: Self-pay

## 2020-06-20 ENCOUNTER — Other Ambulatory Visit: Payer: Self-pay

## 2020-06-20 ENCOUNTER — Ambulatory Visit (INDEPENDENT_AMBULATORY_CARE_PROVIDER_SITE_OTHER): Payer: Medicaid Other | Admitting: Internal Medicine

## 2020-06-20 VITALS — BP 122/86 | HR 61 | Wt 197.0 lb

## 2020-06-20 DIAGNOSIS — Z113 Encounter for screening for infections with a predominantly sexual mode of transmission: Secondary | ICD-10-CM

## 2020-06-20 DIAGNOSIS — Z131 Encounter for screening for diabetes mellitus: Secondary | ICD-10-CM

## 2020-06-20 DIAGNOSIS — Z13220 Encounter for screening for lipoid disorders: Secondary | ICD-10-CM | POA: Diagnosis not present

## 2020-06-20 DIAGNOSIS — R7989 Other specified abnormal findings of blood chemistry: Secondary | ICD-10-CM

## 2020-06-20 DIAGNOSIS — B20 Human immunodeficiency virus [HIV] disease: Secondary | ICD-10-CM | POA: Diagnosis not present

## 2020-06-20 DIAGNOSIS — Z23 Encounter for immunization: Secondary | ICD-10-CM | POA: Diagnosis not present

## 2020-06-20 DIAGNOSIS — R42 Dizziness and giddiness: Secondary | ICD-10-CM | POA: Diagnosis not present

## 2020-06-20 MED ORDER — BICTEGRAVIR-EMTRICITAB-TENOFOV 50-200-25 MG PO TABS: 1.0000 | ORAL_TABLET | Freq: Every day | ORAL | 11 refills | Status: DC

## 2020-06-20 MED ORDER — BICTEGRAVIR-EMTRICITAB-TENOFOV 50-200-25 MG PO TABS
1.0000 | ORAL_TABLET | Freq: Every day | ORAL | 11 refills | Status: AC
  Filled 2020-06-20: qty 30, 30d supply, fill #0

## 2020-06-20 NOTE — Addendum Note (Signed)
Addended byPrudencio Pair T on: 06/20/2020 09:32 AM   Modules accepted: Orders

## 2020-06-20 NOTE — Addendum Note (Signed)
Addended byPrudencio Pair T on: 06/20/2020 09:36 AM   Modules accepted: Orders

## 2020-06-20 NOTE — Progress Notes (Addendum)
[facility] for Infectious Disease  Patient Active Problem List   Diagnosis Date Noted  . Postpartum care following cesarean delivery 05/30/2020  . History of cesarean section 05/02/2020  . Transient hypertension of pregnancy in third trimester 04/21/2020  . Gestational diabetes 02/19/2020  . Language barrier 11/13/2019  . HIV (human immunodeficiency virus infection) ([place]) 11/13/2019  . History of C-section 11/13/2019      Subjective:    Patient ID: Miranda George, female    DOB: 28-Nov-1980, 40 y.o.   MRN: [id number]  Cc: HIV care  HPI:  Miranda George is a 40 y.o. female here for continued HIV care  06/20/20 id f/u Patient delivered healthy child 05/2020 Her hiv viral load has been negative several months, on ART, prior to delivery She continues to be compliant with ART with no missed dose Reviewed labs with her Her lft is a little elevated on 06/06/2020; previously exposed to hep a; 10/2019 hep b and c serology negative. She had no issues with peripartum hypertension or HELLP I reviewed her immunization record; only one heplisav dose 12/2019. She doesn't recall getting the second dose yet She is without n/v/diarrhea/abd pain She does feel dizzy since delivery of her child; feels unsteady at times as well but no dyspnea/fatigue. She endorses turning head in any direction makes it worse.  She has good appetite She doesn't breast feed because of the hiv  She is here with her newborn boy of 1 month. His prelim hiv test is negative; he is not on any hiv pill; he'll have repeat hiv testing again soon  Social review --  Lives with her husband Husband: hasn't repeated testing yet -- worried about not being able to pay Substance use: no smoking, drinking, drugs Work: housewife  03/07/20 id f/u Doing well Good prenatal care Gestational diabetes -- advised diet/exercise; no oral or insulin yet No f/c No vaginal bleeding/discharge No  rash Husband still not getting hiv testing repeat/care yet; patient tried to call [facility] but no information yet Compliant with truvada/tivicay Reviewed labs; hiv undetectable   background -------------  She was seen in the ED on 12/03 for abd pain and vaginal bleeding, briefly admitted and dx'ed with subchorionic hematoma. Gc/chlamydia, wet mount negative. The vaginal bleeding had stopped yesterday, and the abdominal pain had also gone  #HIV Dx'ed 10/2019; heterosexual; denies ivdu/promiscuity; patient is married Patient had some blood transfusion during her 2nd pregnancy Husband tested and told her he was negative 2 months ago although there is some question as he tried to call back to the clinic and haven't heard Patient married 1 year; together for 4 years She was seen in [facility] clinic on 10/28 with our pharmacist Cassie and started on tivicay and truvada. She had some nausea at the beginning but no longer. She doesn't miss any dose. 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, v179I/V, R211K PR mutation -- I62v, L63p, v77I/V, I93L There were no phenotypic resistance to NRTI, nnrti, or PI Baseline HIV labs: [id number] negative 11/12/2019 hepatitis b sAb, sAg, cAb; hep cAb negative; hep A Ab positive 11/12/2019 quant gold negative  She has fatigue since pregnancy and that hasn't changed She also endorsed some mild depression but no hi/si (because of positive hiv)  No fever, chill, nightsweat No headache, visual change, blurriness No cough, chest pain, sob No sore throat No diarrhea No urinary urgency, dysuria, flank pain No bruising  No joint pain, myalgia, muscle weakness No focal weakness No rash   No Known Allergies    Outpatient Medications Prior to Visit  Medication Sig Dispense Refill  . ACCU-CHEK GUIDE test strip USE TO CHECK BLOOD SUGAR 4 TIMES DAILY    . Accu-Chek Softclix Lancets lancets 4 (four) times daily.    Marland Kitchen  acetaminophen (TYLENOL) 500 MG tablet Take 2 tablets (1,000 mg total) by mouth every 8 (eight) hours. 30 tablet 0  . dolutegravir (TIVICAY) 50 MG tablet TAKE 1 TABLET (50 MG TOTAL) BY MOUTH DAILY. 30 tablet 2  . emtricitabine-tenofovir (TRUVADA) 200-300 MG tablet TAKE 1 TABLET BY MOUTH DAILY. 30 tablet 2  . ibuprofen (ADVIL) 400 MG tablet Take 1 tablet (400 mg total) by mouth every 6 (six) hours. 30 tablet 0  . metFORMIN (GLUCOPHAGE) 500 MG tablet Take 1 tablet by mouth at bedtime.    Marland Kitchen NIFEdipine (PROCARDIA XL) 60 MG 24 hr tablet Take 1 tablet (60 mg total) by mouth daily. 30 tablet 0  . oxyCODONE (OXY IR/ROXICODONE) 5 MG immediate release tablet Take 1 tablet (5 mg total) by mouth every 4 (four) hours as needed for moderate pain or severe pain. 30 tablet 0  . simethicone (MYLICON) 80 MG chewable tablet Chew 1 tablet (80 mg total) by mouth 3 (three) times daily after meals. 30 tablet 0  . ursodiol (ACTIGALL) 500 MG tablet SMARTSIG:1 Tablet(s) By Mouth Morning-Night     No facility-administered medications prior to visit.     Past Medical History:  Diagnosis Date  . Anemia   . Blood transfusion without reported diagnosis   . Cholestasis during pregnancy   . Fibroid   . GERD (gastroesophageal reflux disease)   . Gestational diabetes   . HIV (human immunodeficiency virus infection) ([place])       Past Surgical History:  Procedure Laterality Date  . APPENDECTOMY     18 years ago  . APPENDECTOMY    . CESAREAN SECTION  2012  . CESAREAN SECTION    . CESAREAN SECTION N/A 05/02/2020   Procedure: CESAREAN SECTION;  Surgeon: Gwynne Edinger, MD;  Location: [facility] LD ORS;  Service: Obstetrics;  Laterality: N/A;      Family History  Problem Relation Age of Onset  . Hypertension Mother   . Diabetes Mother   . Thyroid disease Mother   . Hypertension Father   . Diabetes Father       Social History   Socioeconomic History  . Marital status: Married    Spouse name: Not on file  .  Number of children: Not on file  . Years of education: Not on file  . Highest education level: Not on file  Occupational History  . Not on file  Tobacco Use  . Smoking status: Never Smoker  . Smokeless tobacco: Never Used  Vaping Use  . Vaping Use: Never used  Substance and Sexual Activity  . Alcohol use: Never    Comment: occasionally  . Drug use: Never  . Sexual activity: Yes    Birth control/protection: None  Other Topics Concern  . Not on file  Social History Narrative   ** Merged History Encounter **       Social Determinants of Health   Financial Resource Strain: Not on file  Food Insecurity: Food Insecurity Present  . Worried About Charity fundraiser in the Last Year: Sometimes true  . Ran Out of Food in the Last Year: Sometimes true  Transportation Needs: No  Transportation Needs  . Lack of Transportation (Medical): No  . Lack of Transportation (Non-Medical): No  Physical Activity: Not on file  Stress: Not on file  Social Connections: Not on file  Intimate Partner Violence: Not on file           Objective:    There were no vitals taken for this visit. Nursing note and vital signs reviewed.  Physical Exam General/constitutional: no distress, pleasant HEENT: Normocephalic, PER, Conj Clear, EOMI, Oropharynx clear Neck supple CV: rrr no mrg Lungs: clear to auscultation, normal respiratory effort Abd: Soft, Nontender Ext: no edema Skin: No Rash Neuro: nonfocal MSK: no peripheral joint swelling/tenderness/warmth; back spines nontender     Labs: Reviewed Lab Results  Component Value Date   WBC 4.1 06/06/2020   HGB 11.8 06/06/2020   HCT 37.1 06/06/2020   MCV 83.6 06/06/2020   PLT 261 38/45/3646   Last metabolic panel Lab Results  Component Value Date   GLUCOSE 102 (H) 06/06/2020   NA 139 06/06/2020   K 4.3 06/06/2020   CL 107 06/06/2020   CO2 26 06/06/2020   BUN 14 06/06/2020   CREATININE 0.82 06/06/2020   GFRNONAA >60 05/02/2020    GFRAA 135 02/15/2020   CALCIUM 9.1 06/06/2020   PROT 6.9 06/06/2020   ALBUMIN 2.3 (L) 05/02/2020   LABGLOB 2.9 04/21/2020   AGRATIO 1.2 04/21/2020   BILITOT 0.2 06/06/2020   ALKPHOS 165 (H) 05/02/2020   AST 72 (H) 06/06/2020   ALT 89 (H) 06/06/2020   ANIONGAP 5 05/02/2020    Hiv                cd4 (%)      /    hiv vl 05/23 02/14                                /    <20 12/28               340 (32)    /     32k  Serology: 12/18/19 urine gc/chlam and wetmount negative 10/28 quant gold negative; hep b/c negative; hep a reactive; rpr nonreactive 10/28 hla [id number] negative 10/28 hiv genotype no phenotypic resistance    Imaging: None   Assessment & Plan:   Patient Active Problem List   Diagnosis Date Noted  . Postpartum care following cesarean delivery 05/30/2020  . History of cesarean section 05/02/2020  . Transient hypertension of pregnancy in third trimester 04/21/2020  . Gestational diabetes 02/19/2020  . Language barrier 11/13/2019  . HIV (human immunodeficiency virus infection) ([place]) 11/13/2019  . History of C-section 11/13/2019     Problem List Items Addressed This Visit   None   Visit Diagnoses    Elevated LFTs    -  Primary   Relevant Orders   COMPLETE METABOLIC PANEL WITH GFR   Hepatitis B surface antibody,quantitative   Hepatitis B Surface AntiGEN   Hepatitis B Core Antibody, total   Hepatitis C Antibody   HgB A1c   HIV disease ([place])       Relevant Orders   T-helper cell (CD4)- ([facility] clinic only)   HIV-1 RNA quant-no reflex-bld   CBC   Comprehensive metabolic panel   Screening for diabetes mellitus       Relevant Orders   Lipid panel   Screening for STDs (sexually transmitted diseases)       Relevant Orders   RPR  Urine cytology ancillary only   Screening for cholesterol level       Dizziness       Relevant Orders   CBC     #hiv dx'ed during pregnancy. Suspect transmission from husband. No other risk factors As of this visit she is not  sure of her husband's hiv status. I have advised her to have an open discussion with her husband to get retested. 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 No evidence IRIS. Baseline cd4 prior to med 340 (32%)  -she is currently on medicaid -rediscussed she can have husband tested here/evaluated here at [facility]; information provided -change art to biktarvy 06/20/2020 -discussed u=u -f/u 3 months preclinic labs   #elevated lft Karlene Lineman ? Pregnancy related ? No new medication; only truvada/tivicay so far  -repeat hep b vaccine; only had one shot 12/2019. Repeat hep c -repeat lft -check a1c   #dizzy/vertigo ?post pregnancy Doesn't sound like bppv No sign of acute viral infection otherwise  -f/u obgyn -monitor -check cbc   #gravid -- delivered 05/2020; healthy baby boy  -f/u obgyn as discussed with them in (she'll call and schedule) -f/u pediatrician for her baby boy ongoing hiv exposure care   #hcm -vaccination prevnar 10/28  Due for pneumovax tdap 01/2020 covid moderna 2 shots by 07/2019 No meningococcal vaccine yet -hepatitis heplisav started 12/2019 -annual 10/2019 quant gold negative; rpr negative -woman's health Patient will continue to have routine care with her obstetrician      Follow-up: Return in about 3 months (around 09/20/2020).  I have spent a total of 20 minutes of face-to-face and non-face-to-face time, excluding clinical staff time, preparing to see patient, ordering tests and/or medications, and provide counseling the patient     Jabier Mutton, [place] for Infectious [place] -- -- pager   [phone] cell 06/20/2020, 8:53 AM

## 2020-06-20 NOTE — Addendum Note (Signed)
Addended by: Carlean Purl on: 06/20/2020 11:48 AM   Modules accepted: Orders

## 2020-06-20 NOTE — Patient Instructions (Signed)
  Discussed your husband can come to [facility] for HIV evaluation   Will do today for you: 1) blood test to check your elevated LFT and dizziness 2) change truvada/tivicay to biktarvy 1 tablet once a day 3) hepatitis b vaccine 2nd shot, and pneumovax   Please do blood test again in 3 months at our clinic and see me 2 weeks after that   Follow up with your obgyn and pediatriacian

## 2020-06-21 LAB — CBC
HCT: 39.7 % (ref 35.0–45.0)
Hemoglobin: 12.9 g/dL (ref 11.7–15.5)
MCH: 26.9 pg — ABNORMAL LOW (ref 27.0–33.0)
MCHC: 32.5 g/dL (ref 32.0–36.0)
MCV: 82.9 fL (ref 80.0–100.0)
MPV: 9.5 fL (ref 7.5–12.5)
Platelets: 281 10*3/uL (ref 140–400)
RBC: 4.79 10*6/uL (ref 3.80–5.10)
RDW: 18.4 % — ABNORMAL HIGH (ref 11.0–15.0)
WBC: 4.9 10*3/uL (ref 3.8–10.8)

## 2020-06-21 LAB — COMPLETE METABOLIC PANEL WITH GFR
AG Ratio: 1.3 (calc) (ref 1.0–2.5)
ALT: 76 U/L — ABNORMAL HIGH (ref 6–29)
AST: 62 U/L — ABNORMAL HIGH (ref 10–30)
Albumin: 4.2 g/dL (ref 3.6–5.1)
Alkaline phosphatase (APISO): 77 U/L (ref 31–125)
BUN: 12 mg/dL (ref 7–25)
CO2: 26 mmol/L (ref 20–32)
Calcium: 9.7 mg/dL (ref 8.6–10.2)
Chloride: 106 mmol/L (ref 98–110)
Creat: 0.75 mg/dL (ref 0.50–1.10)
GFR, Est African American: 117 mL/min/{1.73_m2} (ref >=60)
GFR, Est Non African American: 101 mL/min/{1.73_m2} (ref >=60)
Globulin: 3.3 g/dL (calc) (ref 1.9–3.7)
Glucose, Bld: 80 mg/dL (ref 65–99)
Potassium: 4.2 mmol/L (ref 3.5–5.3)
Sodium: 141 mmol/L (ref 135–146)
Total Bilirubin: 0.3 mg/dL (ref 0.2–1.2)
Total Protein: 7.5 g/dL (ref 6.1–8.1)

## 2020-06-21 LAB — HEMOGLOBIN A1C
Hgb A1c MFr Bld: 5.5 % of total Hgb (ref <5.7)
Mean Plasma Glucose: 111 mg/dL
eAG (mmol/L): 6.2 mmol/L

## 2020-06-21 LAB — HEPATITIS C ANTIBODY
Hepatitis C Ab: NONREACTIVE
SIGNAL TO CUT-OFF: 0.02 (ref <1.00)

## 2020-06-21 LAB — HEPATITIS B SURFACE ANTIGEN: Hepatitis B Surface Ag: NONREACTIVE

## 2020-06-21 LAB — HEPATITIS B CORE ANTIBODY, TOTAL: Hep B Core Total Ab: NONREACTIVE

## 2020-06-21 LAB — HEPATITIS B SURFACE ANTIBODY, QUANTITATIVE: Hep B S AB Quant (Post): <5 m[IU]/mL — ABNORMAL LOW (ref >=10)

## 2020-06-28 ENCOUNTER — Other Ambulatory Visit (HOSPITAL_COMMUNITY): Payer: Self-pay

## 2020-06-30 ENCOUNTER — Other Ambulatory Visit (HOSPITAL_COMMUNITY): Payer: Self-pay

## 2020-07-19 ENCOUNTER — Other Ambulatory Visit (HOSPITAL_COMMUNITY): Payer: Self-pay

## 2020-08-15 DIAGNOSIS — G4489 Other headache syndrome: Secondary | ICD-10-CM | POA: Diagnosis not present

## 2020-09-20 ENCOUNTER — Other Ambulatory Visit: Payer: Medicaid Other

## 2020-10-10 ENCOUNTER — Encounter: Payer: Medicaid Other | Admitting: Internal Medicine

## 2020-10-11 ENCOUNTER — Other Ambulatory Visit: Payer: Self-pay

## 2020-10-11 ENCOUNTER — Ambulatory Visit (INDEPENDENT_AMBULATORY_CARE_PROVIDER_SITE_OTHER): Payer: Medicaid Other | Admitting: Internal Medicine

## 2020-10-11 VITALS — BP 127/85 | HR 77 | Temp 98.3°F | Wt 200.0 lb

## 2020-10-11 DIAGNOSIS — Z113 Encounter for screening for infections with a predominantly sexual mode of transmission: Secondary | ICD-10-CM | POA: Diagnosis not present

## 2020-10-11 DIAGNOSIS — B20 Human immunodeficiency virus [HIV] disease: Secondary | ICD-10-CM

## 2020-10-11 DIAGNOSIS — R7989 Other specified abnormal findings of blood chemistry: Secondary | ICD-10-CM

## 2020-10-11 DIAGNOSIS — Z23 Encounter for immunization: Secondary | ICD-10-CM | POA: Diagnosis not present

## 2020-10-11 DIAGNOSIS — Z7689 Persons encountering health services in other specified circumstances: Secondary | ICD-10-CM

## 2020-10-11 NOTE — Addendum Note (Signed)
Addended by: Caffie Pinto on: 10/11/2020 10:58 AM   Modules accepted: Orders

## 2020-10-11 NOTE — Patient Instructions (Signed)
Labs today  Received flu vaccine today already  Refer to [facility] for pap   See me in 6 months

## 2020-10-11 NOTE — Progress Notes (Signed)
[facility] for Infectious Disease  Patient Active Problem List   Diagnosis Date Noted   Postpartum care following cesarean delivery 05/30/2020   History of cesarean section 05/02/2020   Transient hypertension of pregnancy in third trimester 04/21/2020   Gestational diabetes 02/19/2020   Language barrier 11/13/2019   HIV (human immunodeficiency virus infection) ([place]) 11/13/2019   History of C-section 11/13/2019      Subjective:    Patient ID: Miranda George, female    DOB: [birth year], 40 y.o.   MRN: [id number]  Cc: HIV care  HPI:  Miranda George is a 40 y.o. female here for continued HIV care   9/27 id f/u No longer on metformin Takes biktarvy Virologically and immunologically well controlled No missed biktarvy dose last 4 weeks Baby boy is 5 months now, lives with mom (and husband, and older son). No issue. Not breast feeding. Seronegative No f/c, headache, rash, joint pain, vaginal discharge/pelvic pain Reports upper epigastric pain radiate to both sides -- since taking biktarvy Some heart burn No pcp yet -- trying to get internal medicine clinic here as pcp.  Doesn't take acid blocker Milk gives her some relief Denies taking nsaids  Last well woman exam/pap during pregnancy. She states she wants to do it here  Husband told her he tested twice and negative. Patient without other sexual exposure   06/20/20 id f/u Patient delivered healthy child 05/2020 Her hiv viral load has been negative several months, on ART, prior to delivery She continues to be compliant with ART with no missed dose Reviewed labs with her Her lft is a little elevated on 06/06/2020; previously exposed to hep a; 10/2019 hep b and c serology negative. She had no issues with peripartum hypertension or HELLP I reviewed her immunization record; only one heplisav dose 12/2019. She doesn't recall getting the second dose yet She is without n/v/diarrhea/abd pain She  does feel dizzy since delivery of her child; feels unsteady at times as well but no dyspnea/fatigue. She endorses turning head in any direction makes it worse.  She has good appetite She doesn't breast feed because of the hiv  She is here with her newborn boy of 1 month. His prelim hiv test is negative; he is not on any hiv pill; he'll have repeat hiv testing again soon  Social review --  Lives with her husband Husband: hasn't repeated testing yet -- worried about not being able to pay Substance use: no smoking, drinking, drugs Work: housewife  03/07/20 id f/u Doing well Good prenatal care Gestational diabetes -- advised diet/exercise; no oral or insulin yet No f/c No vaginal bleeding/discharge No rash Husband still not getting hiv testing repeat/care yet; patient tried to call [facility] but no information yet Compliant with truvada/tivicay Reviewed labs; hiv undetectable   background -------------  She was seen in the ED on 12/03 for abd pain and vaginal bleeding, briefly admitted and dx'ed with subchorionic hematoma. Gc/chlamydia, wet mount negative. The vaginal bleeding had stopped yesterday, and the abdominal pain had also gone  #HIV Dx'ed 10/2019; heterosexual; denies ivdu/promiscuity; patient is married Patient had some blood transfusion during her 2nd pregnancy Husband tested and told her he was negative 2 months ago although there is some question as he tried to call back to the clinic and haven't heard Patient married 1 year; together for 4 years She was seen in [facility] clinic on 10/28 with our pharmacist Cassie and started on tivicay and truvada. She  had some nausea at the beginning but no longer. She doesn't miss any dose. 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, v179I/V, R211K PR mutation -- I62v, L63p, v77I/V, I93L There were no phenotypic resistance to NRTI, nnrti, or PI Baseline HIV labs: [id number] negative 11/12/2019  hepatitis b sAb, sAg, cAb; hep cAb negative; hep A Ab positive 11/12/2019 quant gold negative  She has fatigue since pregnancy and that hasn't changed She also endorsed some mild depression but no hi/si (because of positive hiv)  No fever, chill, nightsweat No headache, visual change, blurriness No cough, chest pain, sob No sore throat No diarrhea No urinary urgency, dysuria, flank pain No bruising No joint pain, myalgia, muscle weakness No focal weakness No rash   No Known Allergies    Outpatient Medications Prior to Visit  Medication Sig Dispense Refill   ACCU-CHEK GUIDE test strip USE TO CHECK BLOOD SUGAR 4 TIMES DAILY     Accu-Chek Softclix Lancets lancets 4 (four) times daily.     acetaminophen (TYLENOL) 500 MG tablet Take 2 tablets (1,000 mg total) by mouth every 8 (eight) hours. 30 tablet 0   BIKTARVY 50-200-25 MG TABS tablet Take 1 tablet by mouth daily.     metFORMIN (GLUCOPHAGE) 500 MG tablet Take 1 tablet by mouth at bedtime.     simethicone (MYLICON) 80 MG chewable tablet Chew 1 tablet (80 mg total) by mouth 3 (three) times daily after meals. 30 tablet 0   NIFEdipine (PROCARDIA XL) 60 MG 24 hr tablet Take 1 tablet (60 mg total) by mouth daily. (Patient not taking: Reported on 10/11/2020) 30 tablet 0   oxyCODONE (OXY IR/ROXICODONE) 5 MG immediate release tablet Take 1 tablet (5 mg total) by mouth every 4 (four) hours as needed for moderate pain or severe pain. (Patient not taking: Reported on 10/11/2020) 30 tablet 0   ursodiol (ACTIGALL) 500 MG tablet SMARTSIG:1 Tablet(s) By Mouth Morning-Night (Patient not taking: Reported on 10/11/2020)     No facility-administered medications prior to visit.     Past Medical History:  Diagnosis Date   Anemia    Blood transfusion without reported diagnosis    Cholestasis during pregnancy    Fibroid    GERD (gastroesophageal reflux disease)    Gestational diabetes    HIV (human immunodeficiency virus infection) ([place])        Past Surgical History:  Procedure Laterality Date   APPENDECTOMY     18 years ago   APPENDECTOMY     CESAREAN SECTION  2012   [place] N/A 05/02/2020   Procedure: CESAREAN SECTION;  Surgeon: Gwynne Edinger, MD;  Location: [facility] LD ORS;  Service: Obstetrics;  Laterality: N/A;      Family History  Problem Relation Age of Onset   Hypertension Mother    Diabetes Mother    Thyroid disease Mother    Hypertension Father    Diabetes Father       Social History   Socioeconomic History   Marital status: Married    Spouse name: Not on file   Number of children: Not on file   Years of education: Not on file   Highest education level: Not on file  Occupational History   Not on file  Tobacco Use   Smoking status: Never   Smokeless tobacco: Never  Vaping Use   Vaping Use: Never used  Substance and Sexual Activity   Alcohol  use: Never    Comment: occasionally   Drug use: Never   Sexual activity: Yes    Birth control/protection: None  Other Topics Concern   Not on file  Social History Narrative   ** Merged History Encounter **       Social Determinants of Health   Financial Resource Strain: Not on file  Food Insecurity: Food Insecurity Present   Worried About Charity fundraiser in the Last Year: Sometimes true   Ran Out of Food in the Last Year: Sometimes true  Transportation Needs: No Transportation Needs   Lack of Transportation (Medical): No   Lack of Transportation (Non-Medical): No  Physical Activity: Not on file  Stress: Not on file  Social Connections: Not on file  Intimate Partner Violence: Not on file           Objective:    BP 127/85   Pulse 77   Temp 98.3 F (36.8 C) (Oral)   Wt 200 lb (90.7 kg)   BMI 41.80 kg/m  Nursing note and vital signs reviewed.  Physical Exam General/constitutional: no distress, pleasant HEENT: Normocephalic, PER, Conj Clear, EOMI, Oropharynx clear Neck supple CV: rrr no  mrg Lungs: clear to auscultation, normal respiratory effort Abd: Soft, Nontender Ext: no edema Skin: No Rash Neuro: nonfocal MSK: no peripheral joint swelling/tenderness/warmth; back spines nontender      Labs: Reviewed Lab Results  Component Value Date   WBC 4.9 06/20/2020   HGB 12.9 06/20/2020   HCT 39.7 06/20/2020   MCV 82.9 06/20/2020   PLT 281 28/20/6015   Last metabolic panel Lab Results  Component Value Date   GLUCOSE 80 06/20/2020   NA 141 06/20/2020   K 4.2 06/20/2020   CL 106 06/20/2020   CO2 26 06/20/2020   BUN 12 06/20/2020   CREATININE 0.75 06/20/2020   GFRNONAA 101 06/20/2020   GFRAA 117 06/20/2020   CALCIUM 9.7 06/20/2020   PROT 7.5 06/20/2020   ALBUMIN 2.3 (L) 05/02/2020   LABGLOB 2.9 04/21/2020   AGRATIO 1.2 04/21/2020   BILITOT 0.3 06/20/2020   ALKPHOS 165 (H) 05/02/2020   AST 62 (H) 06/20/2020   ALT 76 (H) 06/20/2020   ANIONGAP 5 05/02/2020    Hiv                cd4 (%)      /    hiv vl 05/23 02/14                                /    <20 12/28               340 (32)    /     32k  Serology: 12/18/19 urine gc/chlam and wetmount negative 10/28 quant gold negative; hep b/c negative; hep a reactive; rpr nonreactive 10/28 hla [id number] negative 10/28 hiv genotype no phenotypic resistance    Imaging: None   Assessment & Plan:   Patient Active Problem List   Diagnosis Date Noted   Postpartum care following cesarean delivery 05/30/2020   History of cesarean section 05/02/2020   Transient hypertension of pregnancy in third trimester 04/21/2020   Gestational diabetes 02/19/2020   Language barrier 11/13/2019   HIV (human immunodeficiency virus infection) ([place]) 11/13/2019   History of C-section 11/13/2019     Problem List Items Addressed This Visit   None Visit Diagnoses     Elevated LFTs    -  Primary   Need for immunization against influenza       Relevant Orders   Flu Vaccine QUAD 57moIM (Fluarix, Fluzone & Alfiuria Quad PF)  (Completed)      #hiv dx'ed during pregnancy. Suspect transmission from husband. No other risk factors As of this visit she is not sure of her husband's hiv status. I have advised her to have an open discussion with her husband to get retested. 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 No evidence IRIS. Baseline cd4 prior to med 340 (32%)  Lab Results  Component Value Date   HIV1RNAQUANT <20 (H) 06/06/2020   Lab Results  Component Value Date   CD4TCELL 45 06/06/2020   CD4TABS 527 06/06/2020   Tolerating/compliant with biktarvy 567month old boy seronegative; not breast feeding; healthy I don't think her husband is frank with her yet as I think he is the source. Advise ongoing conversation with the husband  -continue biktarvy -encourage ongoing compliance -refer to [facility] for well woman's exam -discussed u=u -flabs today; well controlled; will do 6 months f/u otherwise   #epigastric pain Dyspepsia vs ulcer/hypylori Biliary colic a possibility  -patient to see internal medicine clinic for further follow up   #elevated lft NKarlene Lineman?  -repeat lft today -will need lifestyle change and get weight under control    #hcm -vaccination prevnar 11/12/19 Pneumovax 06/2020 tdap 01/2020 Due for flu shot -- receieved 9/27 covid moderna 2 shots by 07/2019 No meningococcal vaccine yet -hepatitis heplisav started 12/2019 finished 06/2020 -- repeat hep b sab quant today -annual 10/2019 quant gold negative 06/2020 rpr negative -woman's health Refer to SJanene Madeira     Follow-up: Return in about 6 months (around 04/10/2021).  I have spent a total of 20 minutes of face-to-face and non-face-to-face time, excluding clinical staff time, preparing to see patient, ordering tests and/or medications, and provide counseling the  patient     TJabier Mutton [facility] [place]-- -- pager   [phone] 10/11/2020, 9:31 AM

## 2020-10-13 LAB — CBC
HCT: 42.5 % (ref 35.0–45.0)
Hemoglobin: 13.7 g/dL (ref 11.7–15.5)
MCH: 29.2 pg (ref 27.0–33.0)
MCHC: 32.2 g/dL (ref 32.0–36.0)
MCV: 90.6 fL (ref 80.0–100.0)
MPV: 10 fL (ref 7.5–12.5)
Platelets: 260 10*3/uL (ref 140–400)
RBC: 4.69 10*6/uL (ref 3.80–5.10)
RDW: 14.2 % (ref 11.0–15.0)
WBC: 5.3 10*3/uL (ref 3.8–10.8)

## 2020-10-13 LAB — COMPLETE METABOLIC PANEL WITH GFR
AG Ratio: 1.3 (calc) (ref 1.0–2.5)
ALT: 45 U/L — ABNORMAL HIGH (ref 6–29)
AST: 39 U/L — ABNORMAL HIGH (ref 10–30)
Albumin: 4.2 g/dL (ref 3.6–5.1)
Alkaline phosphatase (APISO): 65 U/L (ref 31–125)
BUN: 12 mg/dL (ref 7–25)
CO2: 23 mmol/L (ref 20–32)
Calcium: 9.4 mg/dL (ref 8.6–10.2)
Chloride: 106 mmol/L (ref 98–110)
Creat: 0.73 mg/dL (ref 0.50–0.97)
Globulin: 3.2 g/dL (calc) (ref 1.9–3.7)
Glucose, Bld: 146 mg/dL — ABNORMAL HIGH (ref 65–99)
Potassium: 4.2 mmol/L (ref 3.5–5.3)
Sodium: 136 mmol/L (ref 135–146)
Total Bilirubin: 0.4 mg/dL (ref 0.2–1.2)
Total Protein: 7.4 g/dL (ref 6.1–8.1)
eGFR: 108 mL/min/{1.73_m2} (ref >=60)

## 2020-10-13 LAB — HIV-1 RNA QUANT-NO REFLEX-BLD
HIV 1 RNA Quant: NOT DETECTED Copies/mL
HIV-1 RNA Quant, Log: NOT DETECTED Log cps/mL

## 2020-10-13 LAB — HEPATITIS B SURFACE ANTIBODY, QUANTITATIVE: Hep B S AB Quant (Post): 213 m[IU]/mL (ref >=10)

## 2020-10-17 ENCOUNTER — Telehealth: Payer: Self-pay

## 2020-10-17 NOTE — Telephone Encounter (Signed)
Used pacific interpreter line in spanish to relay results to patient and to continue taking ART medication, f/u in 6 months with Dr.Vu. Patient verbalized her understanding.     [place], CMA

## 2020-10-17 NOTE — Telephone Encounter (Signed)
-----   Message from Jabier Mutton, MD sent at 10/14/2020  4:08 PM EDT ----- Please let her know the labs look good. Hiv well controlled. Continue on her current ART and f/u 6 months  thanks

## 2020-11-04 ENCOUNTER — Ambulatory Visit: Payer: Medicaid Other | Admitting: Infectious Diseases

## 2020-12-27 ENCOUNTER — Ambulatory Visit: Payer: Medicaid Other | Admitting: Infectious Diseases

## 2021-02-24 IMAGING — CT CT ABD-PELV W/ CM
2 of 4 series · 17 of 46 positions shown, 19 images · IV contrast (APPLIED)
Comparison: None.

CLINICAL DATA: Acute on chronic left lower quadrant abdominal pain.

EXAM:
CT ABDOMEN AND PELVIS WITH CONTRAST
TECHNIQUE: Multidetector CT imaging of the abdomen and pelvis was performed
using the standard protocol following bolus administration of
intravenous contrast.
CONTRAST:  100mL OMNIPAQUE IOHEXOL 300 MG/ML  SOLN

[Series 3: abd/ pelvis 5.0 i30f 2 · axial · 0.75mm/px · z∈[+956,+1350]mm · 14 of 87 slices shown, 16 images]
[im 4/87  soft-tissue]
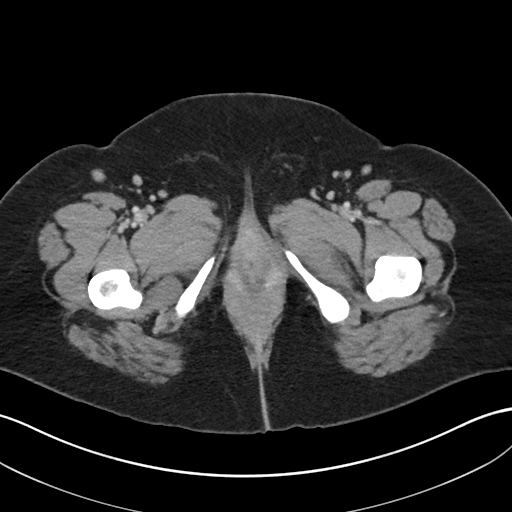
[im 4/87  bone]
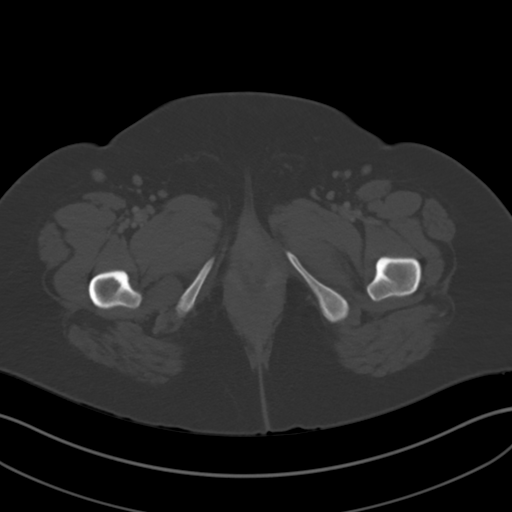
[im 11/87  soft-tissue]
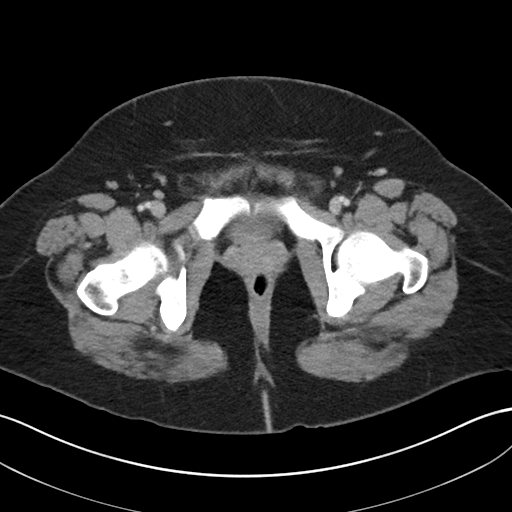
[im 18/87  soft-tissue]
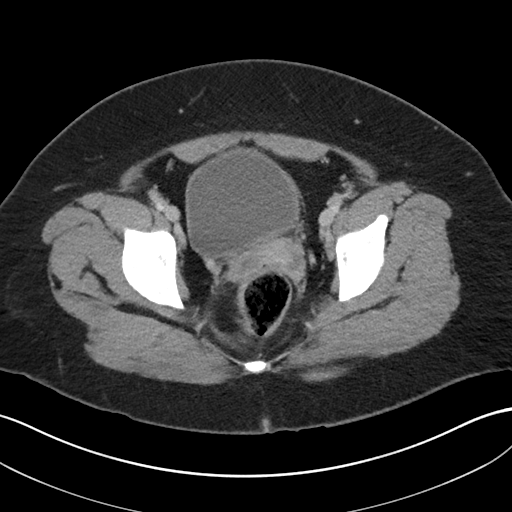
[im 22/87  soft-tissue]
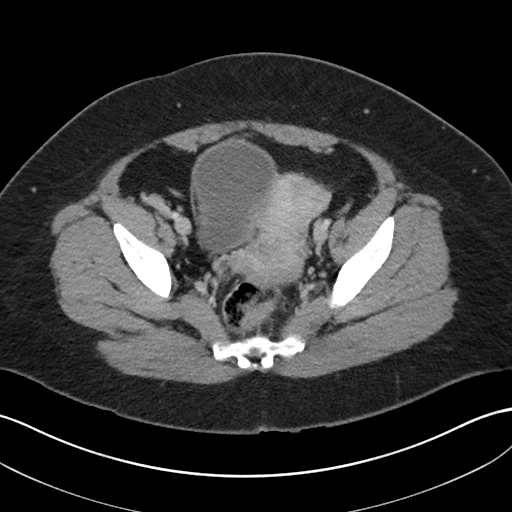
[im 29/87  soft-tissue]
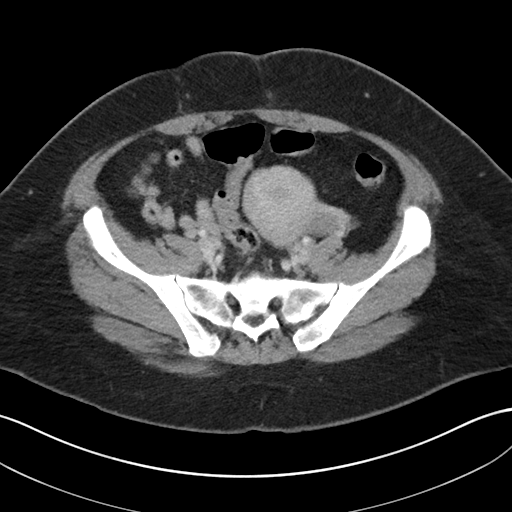
[im 36/87  soft-tissue]
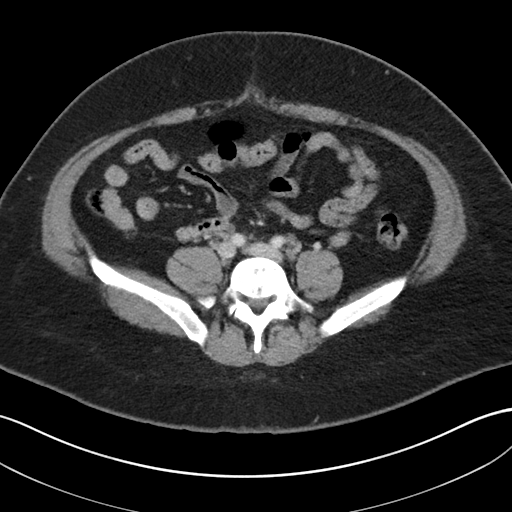
[im 40/87  soft-tissue]
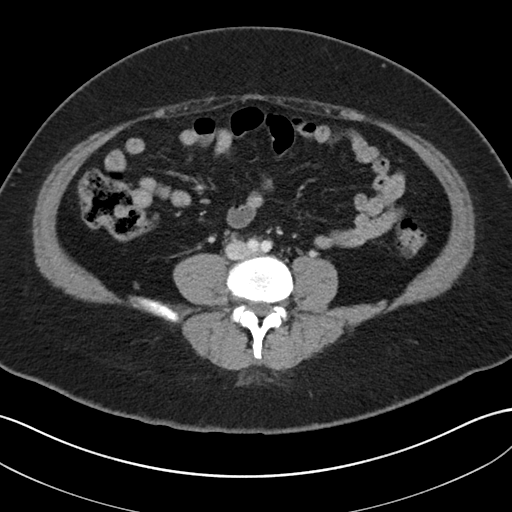
[im 47/87  soft-tissue]
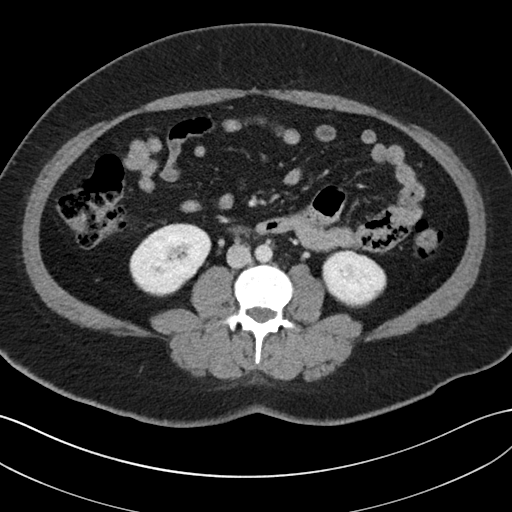
[im 51/87  soft-tissue]
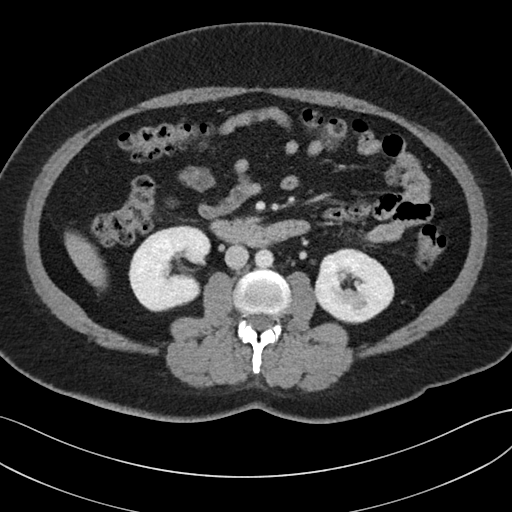
[im 51/87  bone]
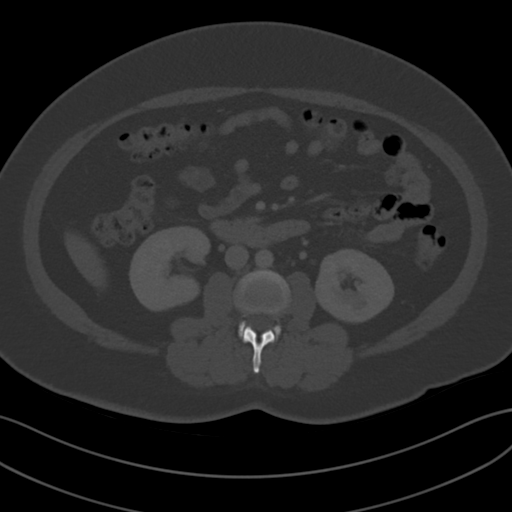
[im 58/87  soft-tissue]
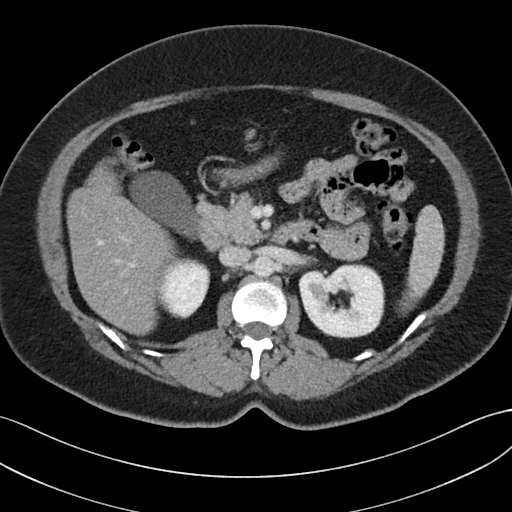
[im 65/87  soft-tissue]
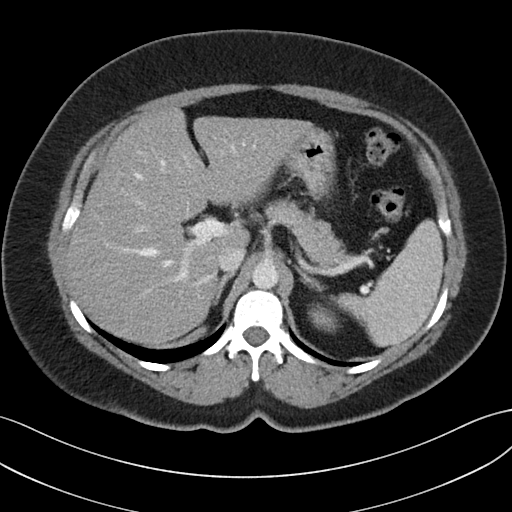
[im 69/87  soft-tissue]
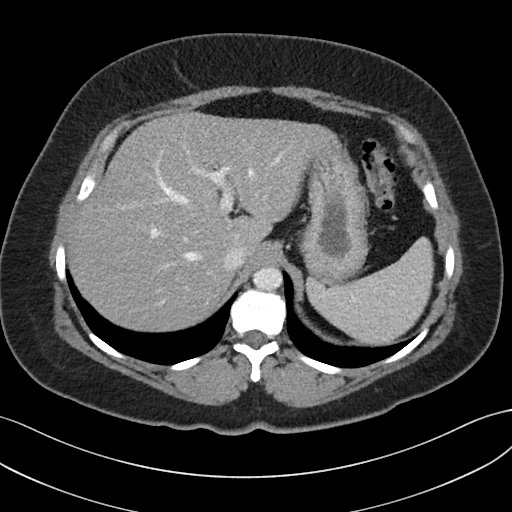
[im 76/87  soft-tissue]
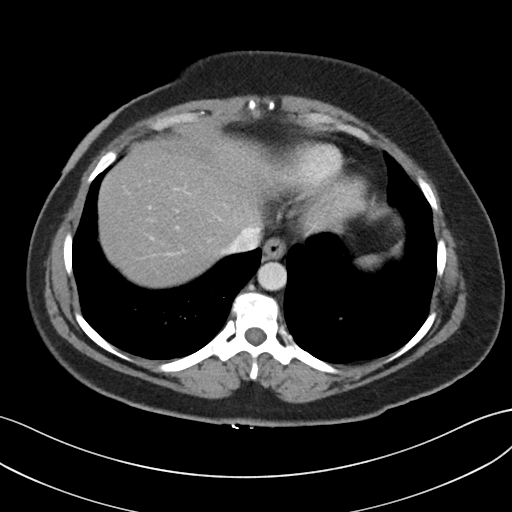
[im 83/87  soft-tissue]
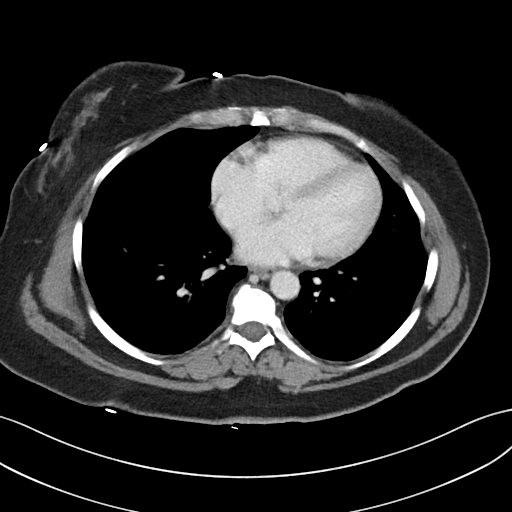

[Series 6: coronal soft tissue · coronal · 0.85mm/px · 3 of 118 slices shown]
[im 40/118  soft-tissue]
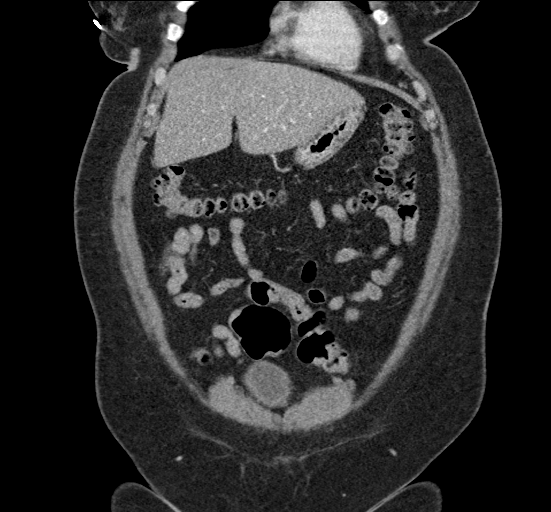
[im 53/118  soft-tissue]
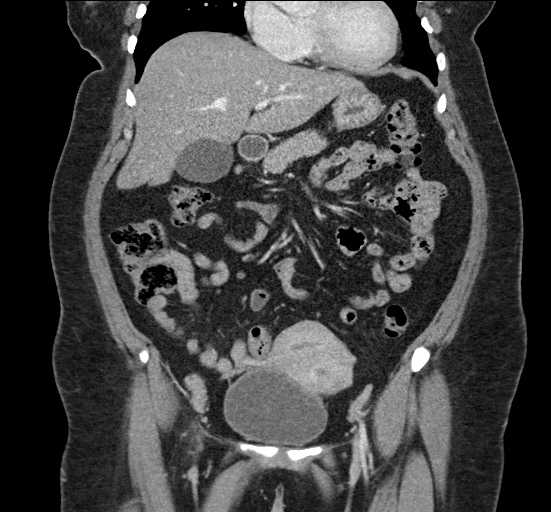
[im 66/118  soft-tissue]
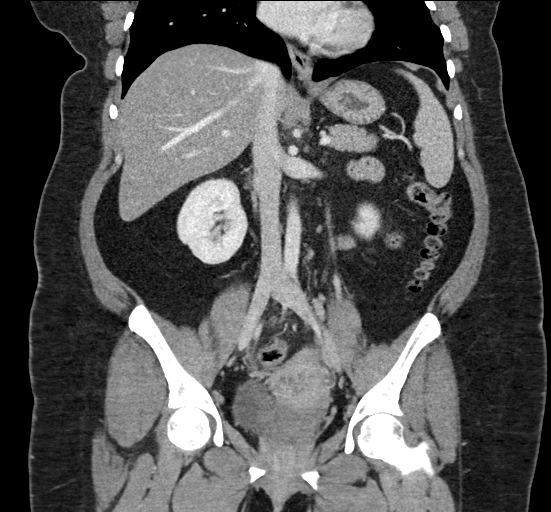

[17 of 46 positions shown; findings below may reference images not displayed]

FINDINGS: Lower chest: No acute abnormality.

Hepatobiliary: No focal liver abnormality is seen. No gallstones,
gallbladder wall thickening, or biliary dilatation.

Pancreas: Unremarkable. No pancreatic ductal dilatation or
surrounding inflammatory changes.

Spleen: Normal in size without focal abnormality.

Adrenals/Urinary Tract: Adrenal glands are unremarkable. Kidneys are
normal, without renal calculi, focal lesion, or hydronephrosis.
Bladder is unremarkable.

Stomach/Bowel: The stomach appears normal. There is no evidence of
bowel obstruction or inflammation. Patient is status post
appendectomy.

Vascular/Lymphatic: No significant vascular findings are present. No
enlarged abdominal or pelvic lymph nodes.

Reproductive: 4 cm uterine fibroid is noted. No significant adnexal
abnormality is noted.

Other: No abdominal wall hernia or abnormality. No abdominopelvic
ascites.

Musculoskeletal: No acute or significant osseous findings.
IMPRESSION: 4 cm uterine fibroid. No other abnormality seen in the abdomen or
pelvis.

## 2021-04-11 ENCOUNTER — Ambulatory Visit (INDEPENDENT_AMBULATORY_CARE_PROVIDER_SITE_OTHER): Payer: Medicaid Other | Admitting: Internal Medicine

## 2021-04-11 ENCOUNTER — Encounter: Payer: Self-pay | Admitting: Internal Medicine

## 2021-04-11 ENCOUNTER — Other Ambulatory Visit: Payer: Self-pay

## 2021-04-11 VITALS — BP 138/86 | HR 78 | Temp 98.0°F | Wt 208.6 lb

## 2021-04-11 DIAGNOSIS — B20 Human immunodeficiency virus [HIV] disease: Secondary | ICD-10-CM | POA: Diagnosis not present

## 2021-04-11 DIAGNOSIS — R5383 Other fatigue: Secondary | ICD-10-CM

## 2021-04-11 DIAGNOSIS — G8929 Other chronic pain: Secondary | ICD-10-CM

## 2021-04-11 DIAGNOSIS — Z113 Encounter for screening for infections with a predominantly sexual mode of transmission: Secondary | ICD-10-CM | POA: Diagnosis not present

## 2021-04-11 DIAGNOSIS — R519 Headache, unspecified: Secondary | ICD-10-CM

## 2021-04-11 DIAGNOSIS — Z7689 Persons encountering health services in other specified circumstances: Secondary | ICD-10-CM | POA: Diagnosis not present

## 2021-04-11 NOTE — Addendum Note (Signed)
Addended by: Tomi Bamberger on: 04/11/2021 09:19 AM ? ? Modules accepted: Orders ? ?

## 2021-04-11 NOTE — Progress Notes (Signed)
Patient unable to void on visit 04/11/21. Will reschedule for future lab collect for urine cytology. ?Miranda George ? ?

## 2021-04-11 NOTE — Addendum Note (Signed)
Addended by: Tomi Bamberger on: 04/11/2021 09:18 AM ? ? Modules accepted: Orders ? ?

## 2021-04-11 NOTE — Progress Notes (Signed)
?  ? ? ? ? ?[facility] ? ?Patient Active Problem List  ? Diagnosis Date Noted  ? Postpartum care following cesarean delivery 05/30/2020  ? History of cesarean section 05/02/2020  ? Transient hypertension of pregnancy in third trimester 04/21/2020  ? Gestational diabetes 02/19/2020  ? Language barrier 11/13/2019  ? HIV (human immunodeficiency virus infection) ([place]) 11/13/2019  ? History of C-section 11/13/2019  ? ? ? ? ?Subjective:  ? ? Patient ID: Miranda George, female    DOB: 1980-02-02, 41 y.o.   MRN: [id number] ? ?Cc: HIV care ? ?HPI: ? ?Miranda George is a 41 y.o. female here for continued HIV care ? ?04/11/21 id f/u ?She is here with language interpreter and her baby (59 months old)  ?Patient compliant with biktarvy ?Complains of 1 month of headache. Frontal. 7/10. Tylenol helps. No worsening factor. No visual change/numbness/tingling/weakness. Sometimes dizzi. Some time with chest discomfort. No jaw pain. No hearing disturbance.  ?Not positional ?Not worse with straining/coughing ?Sudden onset ?No new medication ?No birth control pill ?No nasal congestion ?Some times light sensitivity; no noise sensitivity ?No fever, chill ?No smoker ?No sinus congestion ? ?Eating drinking ok ?Drink coffee. 1 cup a day in morning for several years. Doesn't make headache worse/better ? ?Patient no hx blood clot ?No family hx migraine ? ?I reviewed her meds list -- Tylenol prn. biktarvy ? ? ?9/27 id f/u ?No longer on metformin ?Takes biktarvy ?Virologically and immunologically well controlled ?No missed biktarvy dose last 4 weeks ?Baby boy is 5 months now, lives with mom (and husband, and older son). No issue. Not breast feeding. Seronegative ?No f/c, headache, rash, joint pain, vaginal discharge/pelvic pain ?Reports upper epigastric pain radiate to both sides -- since taking biktarvy ?Some heart burn ?No pcp yet -- trying to get internal medicine clinic here as pcp.  ?Doesn't take  acid blocker ?Milk gives her some relief ?Denies taking nsaids ? ?Last well woman exam/pap during pregnancy. She states she wants to do it here ? ?Husband told her he tested twice and negative. Patient without other sexual exposure ? ? ?06/20/20 id f/u ?Patient delivered healthy child 05/2020 ?Her hiv viral load has been negative several months, on ART, prior to delivery ?She continues to be compliant with ART with no missed dose ?Reviewed labs with her ?Her lft is a little elevated on 06/06/2020; previously exposed to hep a; 10/2019 hep b and c serology negative. She had no issues with peripartum hypertension or HELLP ?I reviewed her immunization record; only one heplisav dose 12/2019. She doesn't recall getting the second dose yet ?She is without n/v/diarrhea/abd pain ?She does feel dizzy since delivery of her child; feels unsteady at times as well but no dyspnea/fatigue. She endorses turning head in any direction makes it worse.  ?She has good appetite ?She doesn't breast feed because of the hiv ? ?She is here with her newborn boy of 1 month. His prelim hiv test is negative; he is not on any hiv pill; he'll have repeat hiv testing again soon ? ?Social review --  ?Lives with her husband ?Husband: hasn't repeated testing yet -- worried about not being able to pay ?Substance use: no smoking, drinking, drugs ?Work: housewife ? ?03/07/20 id f/u ?Doing well ?Good prenatal care ?Gestational diabetes -- advised diet/exercise; no oral or insulin yet ?No f/c ?No vaginal bleeding/discharge ?No rash ?Husband still not getting hiv testing repeat/care yet; patient tried to call rcid but no information yet ?Compliant  with truvada/tivicay ?Reviewed labs; hiv undetectable  ? ?background ?------------- ? ?She was seen in the ED on 12/03 for abd pain and vaginal bleeding, briefly admitted and dx'ed with subchorionic hematoma. Gc/chlamydia, wet mount negative. The vaginal bleeding had stopped yesterday, and the abdominal pain had also  gone ? ?#HIV ?Dx'ed 10/2019; heterosexual; denies ivdu/promiscuity; patient is married ?Patient had some blood transfusion during her 2nd pregnancy ?Husband tested and told her he was negative 2 months ago although there is some question as he tried to call back to the clinic and haven't heard ?Patient married 1 year; together for 4 years ?She was seen in rcid clinic on 10/28 with our pharmacist Cassie and started on tivicay and truvada. She had some nausea at the beginning but no longer. She doesn't miss any dose. 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 ?She has fatigue since pregnancy and that hasn't changed ?She also endorsed some mild depression but no hi/si (because of positive hiv) ? ?No fever, chill, nightsweat ?No headache, visual change, blurriness ?No cough, chest pain, sob ?No sore throat ?No diarrhea ?No urinary urgency, dysuria, flank pain ?No bruising ?No joint pain, myalgia, muscle weakness ?No focal weakness ?No rash ? ? ?No Known Allergies ? ? ? ?Outpatient Medications Prior to Visit  ?Medication Sig Dispense Refill  ? acetaminophen (TYLENOL) 500 MG tablet Take 2 tablets (1,000 mg total) by mouth every 8 (eight) hours. 30 tablet 0  ? BIKTARVY 50-200-25 MG TABS tablet Take 1 tablet by mouth daily.    ? ACCU-CHEK GUIDE test strip USE TO CHECK BLOOD SUGAR 4 TIMES DAILY    ? Accu-Chek Softclix Lancets lancets 4 (four) times daily.    ? metFORMIN (GLUCOPHAGE) 500 MG tablet Take 1 tablet by mouth at bedtime. (Patient not taking: Reported on 04/11/2021)    ? NIFEdipine (PROCARDIA XL) 60 MG 24 hr tablet Take 1 tablet (60 mg total) by mouth daily. (Patient not taking: Reported on 10/11/2020) 30 tablet  0  ? oxyCODONE (OXY IR/ROXICODONE) 5 MG immediate release tablet Take 1 tablet (5 mg total) by mouth every 4 (four) hours as needed for moderate pain or severe pain. (Patient not taking: Reported on 10/11/2020) 30 tablet 0  ? simethicone (MYLICON) 80 MG chewable tablet Chew 1 tablet (80 mg total) by mouth 3 (three) times daily after meals. (Patient not taking: Reported on 04/11/2021) 30 tablet 0  ? ursodiol (ACTIGALL) 500 MG tablet SMARTSIG:1 Tablet(s) By Mouth Morning-Night (Patient not taking: Reported on 10/11/2020)    ? ?No facility-administered medications prior to visit.  ? ? ? ?Past Medical History:  ?Diagnosis Date  ? Anemia   ? Blood transfusion without reported diagnosis   ? Cholestasis during pregnancy   ? Fibroid   ? GERD (gastroesophageal reflux disease)   ? Gestational diabetes   ? HIV (human immunodeficiency virus infection) ([place])   ? ? ? ? ?Past Surgical History:  ?Procedure Laterality Date  ? APPENDECTOMY    ? 18 years ago  ? APPENDECTOMY    ? CESAREAN SECTION  2012  ? CESAREAN SECTION    ? CESAREAN SECTION N/A 05/02/2020  ? Procedure: CESAREAN SECTION;  Surgeon: Gwynne Edinger, MD;  Location: [facility]S;  Service: Obstetrics;  Laterality: N/A;  ? ? ? ? ?Family History  ?Problem Relation Age of Onset  ? Hypertension Mother   ? Diabetes Mother   ? Thyroid disease Mother   ? Hypertension Father   ? Diabetes Father   ? ? ? ? ?Social History  ? ?Socioeconomic History  ? Marital status: Married  ?  Spouse name: Not on file  ? Number of children: Not on file  ? Years of education: Not on file  ? Highest education level: Not on file  ?Occupational History  ? Not on file  ?Tobacco Use  ? Smoking status: Never  ? Smokeless tobacco: Never  ?Vaping Use  ? Vaping Use: Never used  ?Substance and Sexual Activity  ? Alcohol use: Never  ?  Comment: occasionally  ? Drug use: Never  ? Sexual activity: Yes  ?  Birth control/protection: None  ?  Comment: declined condoms  ?Other Topics Concern  ? Not on file  ?Social  History Narrative  ? ** Merged History Encounter **  ?    ? ?Social Determinants of Health  ? ?Financial Resource Strain: Not on file  ?Food Insecurity: Food Insecurity Present  ? Worried About Wellsite geologist

## 2021-04-11 NOTE — Patient Instructions (Signed)
Brain mri for headache ?Continue tylenol ?4 weeks follow up ? ?Hiv testing/std screening ?Continue biktarvy ? ?Referral to internal medicine/primary care ?

## 2021-04-12 LAB — T-HELPER CELL (CD4) - (RCID CLINIC ONLY)
CD4 % Helper T Cell: 47 % (ref 33–65)
CD4 T Cell Abs: 776 /uL (ref 400–1790)

## 2021-04-13 LAB — HIV-1 RNA QUANT-NO REFLEX-BLD
HIV 1 RNA Quant: 41 Copies/mL — ABNORMAL HIGH
HIV-1 RNA Quant, Log: 1.61 Log cps/mL — ABNORMAL HIGH

## 2021-04-13 LAB — COMPLETE METABOLIC PANEL WITH GFR
AG Ratio: 1.4 (calc) (ref 1.0–2.5)
ALT: 51 U/L — ABNORMAL HIGH (ref 6–29)
AST: 43 U/L — ABNORMAL HIGH (ref 10–30)
Albumin: 4.1 g/dL (ref 3.6–5.1)
Alkaline phosphatase (APISO): 79 U/L (ref 31–125)
BUN: 11 mg/dL (ref 7–25)
CO2: 22 mmol/L (ref 20–32)
Calcium: 9 mg/dL (ref 8.6–10.2)
Chloride: 107 mmol/L (ref 98–110)
Creat: 0.67 mg/dL (ref 0.50–0.97)
Globulin: 2.9 g/dL (calc) (ref 1.9–3.7)
Glucose, Bld: 141 mg/dL — ABNORMAL HIGH (ref 65–99)
Potassium: 3.7 mmol/L (ref 3.5–5.3)
Sodium: 139 mmol/L (ref 135–146)
Total Bilirubin: 0.3 mg/dL (ref 0.2–1.2)
Total Protein: 7 g/dL (ref 6.1–8.1)
eGFR: 114 mL/min/{1.73_m2} (ref >=60)

## 2021-04-13 LAB — CBC
HCT: 41.6 % (ref 35.0–45.0)
Hemoglobin: 13.8 g/dL (ref 11.7–15.5)
MCH: 29.7 pg (ref 27.0–33.0)
MCHC: 33.2 g/dL (ref 32.0–36.0)
MCV: 89.5 fL (ref 80.0–100.0)
MPV: 10.3 fL (ref 7.5–12.5)
Platelets: 247 10*3/uL (ref 140–400)
RBC: 4.65 10*6/uL (ref 3.80–5.10)
RDW: 13.7 % (ref 11.0–15.0)
WBC: 4.4 10*3/uL (ref 3.8–10.8)

## 2021-04-13 LAB — TSH+FREE T4: TSH W/REFLEX TO FT4: 3.38 mIU/L

## 2021-04-13 LAB — RPR: RPR Ser Ql: NONREACTIVE

## 2021-04-22 ENCOUNTER — Ambulatory Visit (HOSPITAL_COMMUNITY)
Admission: RE | Admit: 2021-04-22 | Discharge: 2021-04-22 | Disposition: A | Discharge status: Home / Self Care | Payer: Medicaid Other | Source: Ambulatory Visit | Attending: Internal Medicine | Admitting: Internal Medicine

## 2021-04-22 DIAGNOSIS — G8929 Other chronic pain: Secondary | ICD-10-CM | POA: Diagnosis present

## 2021-04-22 DIAGNOSIS — R519 Headache, unspecified: Secondary | ICD-10-CM | POA: Diagnosis not present

## 2021-05-03 ENCOUNTER — Telehealth: Payer: Self-pay

## 2021-05-03 NOTE — Telephone Encounter (Signed)
I spoke to patient's husband and advised him of patient MRI results. Patient's husband stated the patient is no longer experiencing headaches.  ?.Araina Butrick T Alfonse Garringer ? ?

## 2021-05-03 NOTE — Telephone Encounter (Signed)
-----   Message from Jabier Mutton, MD sent at 05/03/2021  8:53 AM EDT ----- ?Please let patient know her mri of the brain is normal.  ? ?If she still have bad headaches I could refer her to neurology. ? ?thanks ?

## 2021-05-09 ENCOUNTER — Encounter: Payer: Self-pay | Admitting: Internal Medicine

## 2021-05-09 ENCOUNTER — Other Ambulatory Visit: Payer: Self-pay

## 2021-05-09 ENCOUNTER — Ambulatory Visit (INDEPENDENT_AMBULATORY_CARE_PROVIDER_SITE_OTHER): Payer: Medicaid Other | Admitting: Internal Medicine

## 2021-05-09 VITALS — BP 186/84 | HR 73 | Temp 97.9°F | Wt 205.4 lb

## 2021-05-09 DIAGNOSIS — R519 Headache, unspecified: Secondary | ICD-10-CM

## 2021-05-09 DIAGNOSIS — B20 Human immunodeficiency virus [HIV] disease: Secondary | ICD-10-CM | POA: Diagnosis not present

## 2021-05-09 DIAGNOSIS — Z1159 Encounter for screening for other viral diseases: Secondary | ICD-10-CM | POA: Diagnosis not present

## 2021-05-09 MED ORDER — BICTEGRAVIR-EMTRICITAB-TENOFOV 50-200-25 MG PO TABS: 1.0000 | ORAL_TABLET | Freq: Every day | ORAL | 3 refills | Status: DC

## 2021-05-09 NOTE — Patient Instructions (Signed)
I have referred you to neurology for headache evaluation ? ?Please do your blood test in 3 months (end of July 2023). Please see me end of august 2023 ? ? ? ?

## 2021-05-09 NOTE — Progress Notes (Signed)
?  ? ? ? ? ?[facility] ? ?Patient Active Problem List  ? Diagnosis Date Noted  ? Postpartum care following cesarean delivery 05/30/2020  ? History of cesarean section 05/02/2020  ? Transient hypertension of pregnancy in third trimester 04/21/2020  ? Gestational diabetes 02/19/2020  ? Language barrier 11/13/2019  ? HIV (human immunodeficiency virus infection) ([place]) 11/13/2019  ? History of C-section 11/13/2019  ? ? ? ? ?Subjective:  ? ? Patient ID: Miranda George, female    DOB: 1980/09/27, 40 y.o.   MRN: [id number] ? ?Cc: HIV care ? ?HPI: ? ?Miranda George is a 41 y.o. female here for continued HIV care ? ?05/09/21 id f/u ?Headache is not as persistent now but still intense. She currently takes 2 times a week. Intensity is 7-8. Frontal/sinus distribution/post-orbital. Some light sensitivity. Episode lasts average 2-3 hours but some times could last the whole day. No focal weakness/numbness ?Discuss if she misses her biktarvy doses and she said no ? ? ? ?04/11/21 id f/u ?She is here with language interpreter and her baby (86 months old)  ?Patient compliant with biktarvy ?Complains of 1 month of headache. Frontal. 7/10. Tylenol helps. No worsening factor. No visual change/numbness/tingling/weakness. Sometimes dizzi. Some time with chest discomfort. No jaw pain. No hearing disturbance.  ?Not positional ?Not worse with straining/coughing ?Sudden onset ?No new medication ?No birth control pill ?No nasal congestion ?Some times light sensitivity; no noise sensitivity ?No fever, chill ?No smoker ?No sinus congestion ? ?Eating drinking ok ?Drink coffee. 1 cup a day in morning for several years. Doesn't make headache worse/better ? ?Patient no hx blood clot ?No family hx migraine ? ?I reviewed her meds list -- Tylenol prn. biktarvy ? ? ?9/27 id f/u ?No longer on metformin ?Takes biktarvy ?Virologically and immunologically well controlled ?No missed biktarvy dose last 4 weeks ?Baby  boy is 5 months now, lives with mom (and husband, and older son). No issue. Not breast feeding. Seronegative ?No f/c, headache, rash, joint pain, vaginal discharge/pelvic pain ?Reports upper epigastric pain radiate to both sides -- since taking biktarvy ?Some heart burn ?No pcp yet -- trying to get internal medicine clinic here as pcp.  ?Doesn't take acid blocker ?Milk gives her some relief ?Denies taking nsaids ? ?Last well woman exam/pap during pregnancy. She states she wants to do it here ? ?Husband told her he tested twice and negative. Patient without other sexual exposure ? ? ?06/20/20 id f/u ?Patient delivered healthy child 05/2020 ?Her hiv viral load has been negative several months, on ART, prior to delivery ?She continues to be compliant with ART with no missed dose ?Reviewed labs with her ?Her lft is a little elevated on 06/06/2020; previously exposed to hep a; 10/2019 hep b and c serology negative. She had no issues with peripartum hypertension or HELLP ?I reviewed her immunization record; only one heplisav dose 12/2019. She doesn't recall getting the second dose yet ?She is without n/v/diarrhea/abd pain ?She does feel dizzy since delivery of her child; feels unsteady at times as well but no dyspnea/fatigue. She endorses turning head in any direction makes it worse.  ?She has good appetite ?She doesn't breast feed because of the hiv ? ?She is here with her newborn boy of 1 month. His prelim hiv test is negative; he is not on any hiv pill; he'll have repeat hiv testing again soon ? ?Social review --  ?Lives with her husband ?Husband: hasn't repeated testing yet -- worried about  not being able to pay ?Substance use: no smoking, drinking, drugs ?Work: housewife ? ?03/07/20 id f/u ?Doing well ?Good prenatal care ?Gestational diabetes -- advised diet/exercise; no oral or insulin yet ?No f/c ?No vaginal bleeding/discharge ?No rash ?Husband still not getting hiv testing repeat/care yet; patient tried to call rcid  but no information yet ?Compliant with truvada/tivicay ?Reviewed labs; hiv undetectable  ? ?background ?------------- ? ?She was seen in the ED on 12/03 for abd pain and vaginal bleeding, briefly admitted and dx'ed with subchorionic hematoma. Gc/chlamydia, wet mount negative. The vaginal bleeding had stopped yesterday, and the abdominal pain had also gone ? ?#HIV ?Dx'ed 10/2019; heterosexual; denies ivdu/promiscuity; patient is married ?Patient had some blood transfusion during her 2nd pregnancy ?Husband tested and told her he was negative 2 months ago although there is some question as he tried to call back to the clinic and haven't heard ?Patient married 1 year; together for 4 years ?She was seen in rcid clinic on 10/28 with our pharmacist Cassie and started on tivicay and truvada. She had some nausea at the beginning but no longer. She doesn't miss any dose. 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 ?She has fatigue since pregnancy and that hasn't changed ?She also endorsed some mild depression but no hi/si (because of positive hiv) ? ?No fever, chill, nightsweat ?No headache, visual change, blurriness ?No cough, chest pain, sob ?No sore throat ?No diarrhea ?No urinary urgency, dysuria, flank pain ?No bruising ?No joint pain, myalgia, muscle weakness ?No focal weakness ?No rash ? ? ?No Known Allergies ? ? ? ?Outpatient Medications Prior to Visit  ?Medication Sig Dispense Refill  ? ACCU-CHEK GUIDE test strip USE TO CHECK BLOOD SUGAR 4 TIMES DAILY    ? Accu-Chek Softclix Lancets lancets 4 (four) times daily.    ? acetaminophen (TYLENOL) 500 MG tablet Take 2 tablets (1,000 mg total) by mouth every 8 (eight)  hours. 30 tablet 0  ? BIKTARVY 50-200-25 MG TABS tablet Take 1 tablet by mouth daily.    ? simethicone (MYLICON) 80 MG chewable tablet Chew 1 tablet (80 mg total) by mouth 3 (three) times daily after meals. (Patient not taking: Reported on 04/11/2021) 30 tablet 0  ? ?No facility-administered medications prior to visit.  ? ? ? ?Past Medical History:  ?Diagnosis Date  ? Anemia   ? Blood transfusion without reported diagnosis   ? Cholestasis during pregnancy   ? Fibroid   ? GERD (gastroesophageal reflux disease)   ? Gestational diabetes   ? HIV (human immunodeficiency virus infection) ([place])   ? ? ? ? ?Past Surgical History:  ?Procedure Laterality Date  ? APPENDECTOMY    ? 18 years ago  ? APPENDECTOMY    ? CESAREAN SECTION  2012  ? CESAREAN SECTION    ? CESAREAN SECTION N/A 05/02/2020  ? Procedure: CESAREAN SECTION;  Surgeon: Gwynne Edinger, MD;  Location: [facility] LD ORS;  Service: Obstetrics;  Laterality: N/A;  ? ? ? ? ?Family History  ?Problem Relation Age of Onset  ? Hypertension Mother   ? Diabetes Mother   ? Thyroid disease Mother   ? Hypertension Father   ? Diabetes Father   ? ? ? ? ?  Social History  ? ?Socioeconomic History  ? Marital status: Married  ?  Spouse name: Not on file  ? Number of children: Not on file  ? Years of education: Not on file  ? Highest education level: Not on file  ?Occupational History  ? Not on file  ?Tobacco Use  ? Smoking status: Never  ? Smokeless tobacco: Never  ?Vaping Use  ? Vaping Use: Never used  ?Substance and Sexual Activity  ? Alcohol use: Never  ?  Comment: occasionally  ? Drug use: Never  ? Sexual activity: Yes  ?  Birth control/protection: None  ?  Comment: declined condoms  ?Other Topics Concern  ? Not on file  ?Social History Narrative  ? ** Merged History Encounter **  ?    ? ?Social Determinants of Health  ? ?Financial Resource Strain: Not on file  ?Food Insecurity: Food Insecurity Present  ? Worried About Charity fundraiser in the Last Year: Sometimes true  ? Ran Out of  Food in the Last Year: Sometimes true  ?Transportation Needs: No Transportation Needs  ? Lack of Transportation (Medical): No  ? Lack of Transportation (Non-Medical): No  ?Physical Activity: Not on file  ?S

## 2021-05-10 ENCOUNTER — Encounter: Payer: Self-pay | Admitting: Neurology

## 2021-08-29 ENCOUNTER — Other Ambulatory Visit: Payer: Self-pay

## 2021-08-29 ENCOUNTER — Other Ambulatory Visit: Payer: Medicaid Other

## 2021-08-29 DIAGNOSIS — B20 Human immunodeficiency virus [HIV] disease: Secondary | ICD-10-CM

## 2021-08-30 LAB — T-HELPER CELL (CD4) - (RCID CLINIC ONLY)
CD4 % Helper T Cell: 46 % (ref 33–65)
CD4 T Cell Abs: 818 /uL (ref 400–1790)

## 2021-09-01 LAB — CBC
HCT: 40.2 % (ref 35.0–45.0)
Hemoglobin: 13.6 g/dL (ref 11.7–15.5)
MCH: 30.1 pg (ref 27.0–33.0)
MCHC: 33.8 g/dL (ref 32.0–36.0)
MCV: 88.9 fL (ref 80.0–100.0)
MPV: 10.3 fL (ref 7.5–12.5)
Platelets: 240 10*3/uL (ref 140–400)
RBC: 4.52 10*6/uL (ref 3.80–5.10)
RDW: 13.1 % (ref 11.0–15.0)
WBC: 4.3 10*3/uL (ref 3.8–10.8)

## 2021-09-01 LAB — COMPREHENSIVE METABOLIC PANEL
AG Ratio: 1.4 (calc) (ref 1.0–2.5)
ALT: 52 U/L — ABNORMAL HIGH (ref 6–29)
AST: 46 U/L — ABNORMAL HIGH (ref 10–30)
Albumin: 4.3 g/dL (ref 3.6–5.1)
Alkaline phosphatase (APISO): 76 U/L (ref 31–125)
BUN: 10 mg/dL (ref 7–25)
CO2: 24 mmol/L (ref 20–32)
Calcium: 9.2 mg/dL (ref 8.6–10.2)
Chloride: 106 mmol/L (ref 98–110)
Creat: 0.81 mg/dL (ref 0.50–0.97)
Globulin: 3.1 g/dL (calc) (ref 1.9–3.7)
Glucose, Bld: 100 mg/dL — ABNORMAL HIGH (ref 65–99)
Potassium: 3.8 mmol/L (ref 3.5–5.3)
Sodium: 139 mmol/L (ref 135–146)
Total Bilirubin: 0.4 mg/dL (ref 0.2–1.2)
Total Protein: 7.4 g/dL (ref 6.1–8.1)

## 2021-09-01 LAB — HIV-1 RNA QUANT-NO REFLEX-BLD
HIV 1 RNA Quant: <20 Copies/mL — ABNORMAL HIGH
HIV-1 RNA Quant, Log: <1.3 Log cps/mL — ABNORMAL HIGH

## 2021-09-12 ENCOUNTER — Encounter: Payer: Medicaid Other | Admitting: Internal Medicine

## 2021-09-15 ENCOUNTER — Telehealth: Payer: Self-pay

## 2021-09-15 NOTE — Telephone Encounter (Signed)
-----   Message from Jabier Mutton, MD sent at 09/15/2021  9:50 AM EDT ----- She no showed recently I believe. Please let her labs look great  We can do phone visit if she has issue otherwise if she needs medication review she could let us know  thanks

## 2021-09-15 NOTE — Telephone Encounter (Signed)
Called patient with interpreter to discuss results and schedule follow up, no answer on either number.   Beryle Flock, RN

## 2021-09-19 ENCOUNTER — Ambulatory Visit (INDEPENDENT_AMBULATORY_CARE_PROVIDER_SITE_OTHER): Payer: Medicaid Other | Admitting: Internal Medicine

## 2021-09-19 ENCOUNTER — Other Ambulatory Visit: Payer: Self-pay

## 2021-09-19 ENCOUNTER — Other Ambulatory Visit (HOSPITAL_COMMUNITY)
Admission: RE | Admit: 2021-09-19 | Discharge: 2021-09-19 | Disposition: A | Discharge status: Home / Self Care | Payer: Medicaid Other | Source: Ambulatory Visit | Attending: Internal Medicine | Admitting: Internal Medicine

## 2021-09-19 VITALS — BP 141/83 | HR 53 | Temp 98.0°F | Wt 203.0 lb

## 2021-09-19 DIAGNOSIS — B20 Human immunodeficiency virus [HIV] disease: Secondary | ICD-10-CM

## 2021-09-19 DIAGNOSIS — Z113 Encounter for screening for infections with a predominantly sexual mode of transmission: Secondary | ICD-10-CM

## 2021-09-19 NOTE — Progress Notes (Signed)
[facility]  Patient Active Problem List   Diagnosis Date Noted   Postpartum care following cesarean delivery 05/30/2020   History of cesarean section 05/02/2020   Transient hypertension of pregnancy in third trimester 04/21/2020   Gestational diabetes 02/19/2020   Language barrier 11/13/2019   HIV (human immunodeficiency virus infection) ([place]) 11/13/2019   History of C-section 11/13/2019      Subjective:    Patient ID: Miranda George, female    DOB: [birth year], 41 y.o.   MRN: [id number]  Cc: HIV care  HPI:  Miranda George is a 41 y.o. female here for continued HIV care  9/5 id clinic Doing well  No complaint Her husband's hiv status is still a mystery to her -- they don't talk about this  She has no missed dose of biktarvy Her viral load is <20  She has no sx but ok with std testing    05/09/21 id f/u Headache is not as persistent now but still intense. She currently takes 2 times a week. Intensity is 7-8. Frontal/sinus distribution/post-orbital. Some light sensitivity. Episode lasts average 2-3 hours but some times could last the whole day. No focal weakness/numbness Discuss if she misses her biktarvy doses and she said no    04/11/21 id f/u She is here with language interpreter and her baby (79 months old)  Patient compliant with biktarvy Complains of 1 month of headache. Frontal. 7/10. Tylenol helps. No worsening factor. No visual change/numbness/tingling/weakness. Sometimes dizzi. Some time with chest discomfort. No jaw pain. No hearing disturbance.  Not positional Not worse with straining/coughing Sudden onset No new medication No birth control pill No nasal congestion Some times light sensitivity; no noise sensitivity No fever, chill No smoker No sinus congestion  Eating drinking ok Drink coffee. 1 cup a day in morning for several years. Doesn't make headache worse/better  Patient no hx blood  clot No family hx migraine  I reviewed her meds list -- Tylenol prn. biktarvy   9/27 id f/u No longer on metformin Takes biktarvy Virologically and immunologically well controlled No missed biktarvy dose last 4 weeks Baby boy is 5 months now, lives with mom (and husband, and older son). No issue. Not breast feeding. Seronegative No f/c, headache, rash, joint pain, vaginal discharge/pelvic pain Reports upper epigastric pain radiate to both sides -- since taking biktarvy Some heart burn No pcp yet -- trying to get internal medicine clinic here as pcp.  Doesn't take acid blocker Milk gives her some relief Denies taking nsaids  Last well woman exam/pap during pregnancy. She states she wants to do it here  Husband told her he tested twice and negative. Patient without other sexual exposure   06/20/20 id f/u Patient delivered healthy child 05/2020 Her hiv viral load has been negative several months, on ART, prior to delivery She continues to be compliant with ART with no missed dose Reviewed labs with her Her lft is a little elevated on 06/06/2020; previously exposed to hep a; 10/2019 hep b and c serology negative. She had no issues with peripartum hypertension or HELLP I reviewed her immunization record; only one heplisav dose 12/2019. She doesn't recall getting the second dose yet She is without n/v/diarrhea/abd pain She does feel dizzy since delivery of her child; feels unsteady at times as well but no dyspnea/fatigue. She endorses turning head in any direction makes it worse.  She has good appetite She doesn't breast feed because of  the hiv  She is here with her newborn boy of 1 month. His prelim hiv test is negative; he is not on any hiv pill; he'll have repeat hiv testing again soon  Social review --  Lives with her husband Husband: hasn't repeated testing yet -- worried about not being able to pay Substance use: no smoking, drinking, drugs Work: housewife  03/07/20 id  f/u Doing well Good prenatal care Gestational diabetes -- advised diet/exercise; no oral or insulin yet No f/c No vaginal bleeding/discharge No rash Husband still not getting hiv testing repeat/care yet; patient tried to call [facility] but no information yet Compliant with truvada/tivicay Reviewed labs; hiv undetectable   background -------------  She was seen in the ED on 12/03 for abd pain and vaginal bleeding, briefly admitted and dx'ed with subchorionic hematoma. Gc/chlamydia, wet mount negative. The vaginal bleeding had stopped yesterday, and the abdominal pain had also gone  #HIV Dx'ed 10/2019; heterosexual; denies ivdu/promiscuity; patient is married Patient had some blood transfusion during her 2nd pregnancy Husband tested and told her he was negative 2 months ago although there is some question as he tried to call back to the clinic and haven't heard Patient married 1 year; together for 4 years She was seen in [facility] clinic on 10/28 with our pharmacist Cassie and started on tivicay and truvada. She had some nausea at the beginning but no longer. She doesn't miss any dose. 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, v179I/V, R211K PR mutation -- I62v, L63p, v77I/V, I93L There were no phenotypic resistance to NRTI, nnrti, or PI Baseline HIV labs: [id number] negative 11/12/2019 hepatitis b sAb, sAg, cAb; hep cAb negative; hep A Ab positive 11/12/2019 quant gold negative  She has fatigue since pregnancy and that hasn't changed She also endorsed some mild depression but no hi/si (because of positive hiv)  No fever, chill, nightsweat No headache, visual change, blurriness No cough, chest pain, sob No sore throat No diarrhea No urinary urgency, dysuria, flank pain No bruising No joint pain, myalgia, muscle weakness No focal weakness No rash   No Known Allergies    Outpatient Medications Prior to Visit  Medication Sig Dispense  Refill   ACCU-CHEK GUIDE test strip USE TO CHECK BLOOD SUGAR 4 TIMES DAILY     Accu-Chek Softclix Lancets lancets 4 (four) times daily.     acetaminophen (TYLENOL) 500 MG tablet Take 2 tablets (1,000 mg total) by mouth every 8 (eight) hours. 30 tablet 0   bictegravir-emtricitabine-tenofovir AF (BIKTARVY) 50-200-25 MG TABS tablet Take 1 tablet by mouth daily. 90 tablet 3   simethicone (MYLICON) 80 MG chewable tablet Chew 1 tablet (80 mg total) by mouth 3 (three) times daily after meals. (Patient not taking: Reported on 04/11/2021) 30 tablet 0   No facility-administered medications prior to visit.     Past Medical History:  Diagnosis Date   Anemia    Blood transfusion without reported diagnosis    Cholestasis during pregnancy    Fibroid    GERD (gastroesophageal reflux disease)    Gestational diabetes    HIV (human immunodeficiency virus infection) ([place])       Past Surgical History:  Procedure Laterality Date   APPENDECTOMY     18 years ago   APPENDECTOMY     CESAREAN SECTION  2012   [place] N/A 05/02/2020   Procedure: CESAREAN SECTION;  Surgeon: Gwynne Edinger, MD;  Location: [facility];  Service: Obstetrics;  Laterality: N/A;      Family History  Problem Relation Age of Onset   Hypertension Mother    Diabetes Mother    Thyroid disease Mother    Hypertension Father    Diabetes Father       Social History   Socioeconomic History   Marital status: Married    Spouse name: Not on file   Number of children: Not on file   Years of education: Not on file   Highest education level: Not on file  Occupational History   Not on file  Tobacco Use   Smoking status: Never   Smokeless tobacco: Never  Vaping Use   Vaping Use: Never used  Substance and Sexual Activity   Alcohol use: Never    Comment: occasionally   Drug use: Never   Sexual activity: Yes    Birth control/protection: None    Comment: declined condoms  Other Topics  Concern   Not on file  Social History Narrative   ** Merged History Encounter **       Social Determinants of Health   Financial Resource Strain: Not on file  Food Insecurity: Food Insecurity Present (05/30/2020)   Hunger Vital Sign    Worried About Running Out of Food in the Last Year: Sometimes true    [place] in the Last Year: Sometimes true  Transportation Needs: No Transportation Needs (05/30/2020)   PRAPARE - Hydrologist (Medical): No    Lack of Transportation (Non-Medical): No  Physical Activity: Not on file  Stress: Not on file  Social Connections: Not on file  Intimate Partner Violence: Not on file           Objective:    BP (!) 141/83   Pulse (!) 53   Temp 98 F (36.7 C) (Oral)   Wt 203 lb (92.1 kg)   SpO2 95%   BMI 42.43 kg/m  Nursing note and vital signs reviewed.  Physical Exam General/constitutional: no distress, pleasant HEENT: Normocephalic, PER, Conj Clear, EOMI, Oropharynx clear Neck supple CV: rrr no mrg Lungs: clear to auscultation, normal respiratory effort Abd: Soft, Nontender Ext: no edema Skin: No Rash Neuro: nonfocal MSK: no peripheral joint swelling/tenderness/warmth; back spines nontender       Labs: Reviewed Lab Results  Component Value Date   WBC 4.3 08/29/2021   HGB 13.6 08/29/2021   HCT 40.2 08/29/2021   MCV 88.9 08/29/2021   PLT 240 59/16/3846   Last metabolic panel Lab Results  Component Value Date   GLUCOSE 100 (H) 08/29/2021   NA 139 08/29/2021   K 3.8 08/29/2021   CL 106 08/29/2021   CO2 24 08/29/2021   BUN 10 08/29/2021   CREATININE 0.81 08/29/2021   GFRNONAA 101 06/20/2020   GFRAA 117 06/20/2020   CALCIUM 9.2 08/29/2021   PROT 7.4 08/29/2021   ALBUMIN 2.3 (L) 05/02/2020   LABGLOB 2.9 04/21/2020   AGRATIO 1.2 04/21/2020   BILITOT 0.4 08/29/2021   ALKPHOS 165 (H) 05/02/2020   AST 46 (H) 08/29/2021   ALT 52 (H) 08/29/2021   ANIONGAP 5 05/02/2020    Hiv                 cd4 (%)      /    hiv vl 08/2021                           /     <  20 04/11/21                           /    41 10/11/20                           /    ud 02/14                                /    <20 12/28               340 (32)    /     32k  Serology: 03/2021 rpr nonreactive 12/18/19 urine gc/chlam and wetmount negative 10/28 quant gold negative; hep b/c negative; hep a reactive; rpr nonreactive 10/28 hla b5701 negative 10/28 hiv genotype no phenotypic resistance    Imaging: 04/22/21 brain mri FINDINGS: Brain: No acute infarct, mass effect or extra-axial collection. No acute or chronic hemorrhage. Normal white matter signal, parenchymal volume and CSF spaces. The midline structures are normal.   Vascular: Major flow voids are preserved.   Skull and upper cervical spine: Normal calvarium and skull base. Visualized upper cervical spine and soft tissues are normal.   Sinuses/Orbits:No paranasal sinus fluid levels or advanced mucosal thickening. No mastoid or middle ear effusion. Normal orbits.   IMPRESSION: Normal brain MRI.   Assessment & Plan:   Patient Active Problem List   Diagnosis Date Noted   Postpartum care following cesarean delivery 05/30/2020   History of cesarean section 05/02/2020   Transient hypertension of pregnancy in third trimester 04/21/2020   Gestational diabetes 02/19/2020   Language barrier 11/13/2019   HIV (human immunodeficiency virus infection) ([place]) 11/13/2019   History of C-section 11/13/2019     Problem List Items Addressed This Visit   None Visit Diagnoses     Screening for STDs (sexually transmitted diseases)    -  Primary   Relevant Orders   Cytology (oral, anal, urethral) ancillary only   RPR   Fluorescent treponemal ab(fta)-IgG-bld   HIV disease ([place])         #hiv dx'ed during pregnancy. Suspect transmission from husband. No other risk factors As of this visit she is not sure of her husband's hiv status. I have advised  her to have an open discussion with her husband to get retested. 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 No evidence IRIS. Baseline cd4 prior to med 340 (32%)  Lab Results  Component Value Date   HIV1RNAQUANT <20 (H) 08/29/2021   Lab Results  Component Value Date   CD4TCELL 46 08/29/2021   CD4TABS 818 08/29/2021   Tolerating/compliant with biktarvy Doing well on biktarvy   -discussed u=u -encourage compliance -continue current HIV medication -labs 6 months during clinic visit; no need to do preclinic labs as she has been very well controlled    #headache Not present any longer    #elevated lft Karlene Lineman ? Stable low grade lft elevation in setting obesity and recent pregnancy  -continue to monitor   #hcm -vaccination prevnar 11/12/19 Pneumovax 06/2020 tdap 01/2020 Flu shot --  covid moderna 2 shots by 07/2019 No meningococcal vaccine yet -hepatitis heplisav started 12/2019 finished 06/2020 -- repeat hep b sAb good response 09/2020 with 213. -std screening Resend today 09/2021 with rpr/fta/urine  gc/chlam -annual 10/2019 quant gold negative 06/2020 rpr negative -woman's health Refer to Janene Madeira      Follow-up: Return in about 6 months (around 03/20/2022).     Jabier Mutton, [facility] [place] -- -- pager   [phone] cell 09/19/2021, 10:36 AM

## 2021-09-19 NOTE — Telephone Encounter (Signed)
Patient was seen by provider today 09/19/21.   Beryle Flock, RN

## 2021-09-19 NOTE — Patient Instructions (Signed)
Will check urine std screen and another blood test for syphilis screen   You are doing great. Continue your current biktarvy   Will see you in 6 months. (You don't nee to do blood tests before the visit; after the visit you can do afterward, so you don't have to make 2 trips)   Thank you

## 2021-09-20 LAB — RPR: RPR Ser Ql: NONREACTIVE

## 2021-09-20 LAB — URINE CYTOLOGY ANCILLARY ONLY
Chlamydia: NEGATIVE
Comment: NEGATIVE
Comment: NEGATIVE
Comment: NORMAL
Neisseria Gonorrhea: NEGATIVE
Trichomonas: NEGATIVE

## 2021-09-27 NOTE — Progress Notes (Deleted)
NEUROLOGY CONSULTATION NOTE  Danaka Llera MRN: [id number] DOB: [birth year]  Referring provider: Prudencio Pair, MD Primary care provider: No PCP  Reason for consult:  headache  Assessment/Plan:   ***   Subjective:  Miranda George is a 41 year old female with HIV who presents for headaches.  History supplemented by referring provider's note.  Interpreter present.  Onset:  *** Location:  *** Quality:  *** Intensity:  ***.  *** denies new headache, thunderclap headache or severe headache that wakes *** from sleep. Aura:  *** Prodrome:  *** Postdrome:  *** Associated symptoms:  ***.  *** denies associated unilateral numbness or weakness. Duration:  *** Frequency:  *** Frequency of abortive medication: *** Triggers:  *** Relieving factors:  *** Activity:  ***  MRI of brain without contrast on 04/22/2021 personally reviewed was normal.   Past NSAIDS/analgesics:  *** Past abortive triptans:  none Past abortive ergotamine:  none Past muscle relaxants:  none Past anti-emetic:  Zofran Past antihypertensive medications:  none Past antidepressant medications:  none Past anticonvulsant medications:  none Past anti-CGRP:  none Past vitamins/Herbal/Supplements:  none Past antihistamines/decongestants:  none Other past therapies:  ***  Current NSAIDS/analgesics:  Tylenol Current triptans:  none Current ergotamine:  none Current anti-emetic:  none Current muscle relaxants:  none Current Antihypertensive medications:  none Current Antidepressant medications:  none Current Anticonvulsant medications:  none Current anti-CGRP:  none Current Vitamins/Herbal/Supplements:  none Current Antihistamines/Decongestants:  none Other therapy:  *** Hormone/birth control:  none Other medications:  Biktarvy   Caffeine:  *** Alcohol:  *** Smoker:  *** Diet:  *** Exercise:  *** Depression:  ***; Anxiety:  *** Other pain:  *** Sleep hygiene:  *** Family history of  headache:  ***      PAST MEDICAL HISTORY: Past Medical History:  Diagnosis Date   Anemia    Blood transfusion without reported diagnosis    Cholestasis during pregnancy    Fibroid    GERD (gastroesophageal reflux disease)    Gestational diabetes    HIV (human immunodeficiency virus infection) ([place])     PAST SURGICAL HISTORY: Past Surgical History:  Procedure Laterality Date   APPENDECTOMY     18 years ago   APPENDECTOMY     CESAREAN SECTION  2012   CESAREAN SECTION     CESAREAN SECTION N/A 05/02/2020   Procedure: CESAREAN SECTION;  Surgeon: Gwynne Edinger, MD;  Location: [facility];  Service: Obstetrics;  Laterality: N/A;    MEDICATIONS: Current Outpatient Medications on File Prior to Visit  Medication Sig Dispense Refill   ACCU-CHEK GUIDE test strip USE TO CHECK BLOOD SUGAR 4 TIMES DAILY     Accu-Chek Softclix Lancets lancets 4 (four) times daily.     acetaminophen (TYLENOL) 500 MG tablet Take 2 tablets (1,000 mg total) by mouth every 8 (eight) hours. 30 tablet 0   bictegravir-emtricitabine-tenofovir AF (BIKTARVY) 50-200-25 MG TABS tablet Take 1 tablet by mouth daily. 90 tablet 3   simethicone (MYLICON) 80 MG chewable tablet Chew 1 tablet (80 mg total) by mouth 3 (three) times daily after meals. (Patient not taking: Reported on 04/11/2021) 30 tablet 0   No current facility-administered medications on file prior to visit.    ALLERGIES: No Known Allergies  FAMILY HISTORY: Family History  Problem Relation Age of Onset   Hypertension Mother    Diabetes Mother    Thyroid disease Mother    Hypertension Father    Diabetes Father  Objective:  *** General: No acute distress.  Patient appears well-groomed.   Head:  Normocephalic/atraumatic Eyes:  fundi examined but not visualized Neck: supple, no paraspinal tenderness, full range of motion Back: No paraspinal tenderness Heart: regular rate and rhythm Lungs: Clear to auscultation bilaterally. Vascular: No  carotid bruits. Neurological Exam: Mental status: alert and oriented to person, place, and time, speech fluent and not dysarthric, language intact. Cranial nerves: CN I: not tested CN II: pupils equal, round and reactive to light, visual fields intact CN III, IV, VI:  full range of motion, no nystagmus, no ptosis CN V: facial sensation intact. CN VII: upper and lower face symmetric CN VIII: hearing intact CN IX, X: gag intact, uvula midline CN XI: sternocleidomastoid and trapezius muscles intact CN XII: tongue midline Bulk & Tone: normal, no fasciculations. Motor:  muscle strength 5/5 throughout Sensation:  Pinprick, temperature and vibratory sensation intact. Deep Tendon Reflexes:  2+ throughout,  toes downgoing.   Finger to nose testing:  Without dysmetria.   Heel to shin:  Without dysmetria.   Gait:  Normal station and stride.  [place]berg negative.    Thank you for allowing me to take part in the care of this patient.  Metta Clines, DO  CC: ***

## 2021-10-02 ENCOUNTER — Encounter: Payer: Self-pay | Admitting: Neurology

## 2021-10-02 ENCOUNTER — Ambulatory Visit: Payer: Medicaid Other | Admitting: Neurology

## 2021-10-02 DIAGNOSIS — Z029 Encounter for administrative examinations, unspecified: Secondary | ICD-10-CM

## 2022-01-19 ENCOUNTER — Other Ambulatory Visit (HOSPITAL_COMMUNITY): Payer: Self-pay

## 2022-03-16 ENCOUNTER — Ambulatory Visit: Payer: Medicaid Other | Admitting: Internal Medicine

## 2022-03-20 ENCOUNTER — Ambulatory Visit: Payer: Medicaid Other | Admitting: Internal Medicine

## 2022-04-07 IMAGING — US US MFM FETAL BPP W/O NON-STRESS
1 series · 14 of 28 positions shown · non-contrast
Comparison: none

[Series 1: us mfm fetal bpp w/o non-stress · 44 acquisitions, 14 frames shown]
[im 2/44]
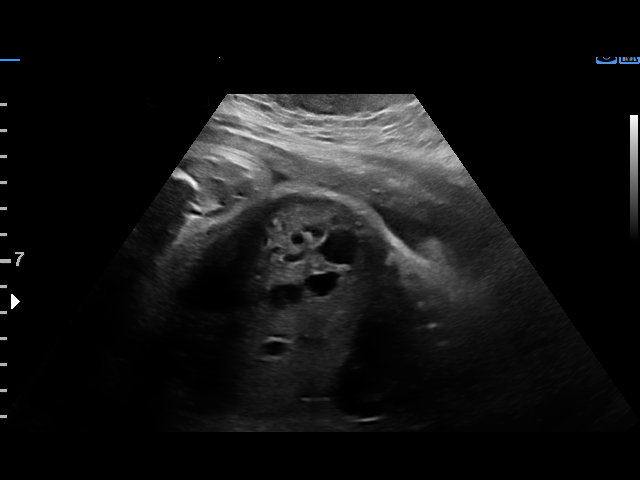
[im 5/44]
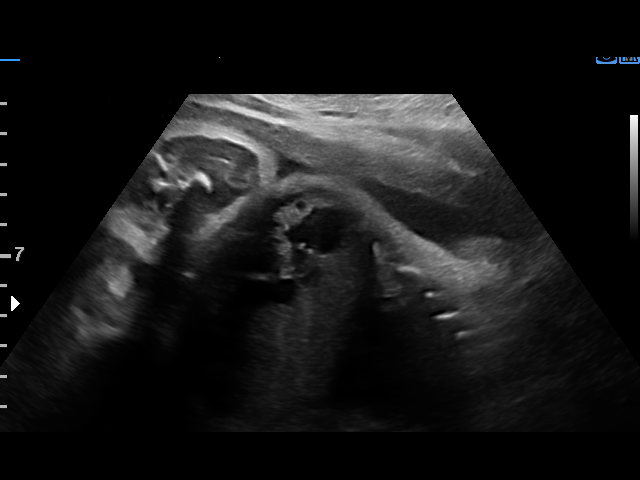
[im 8/44]
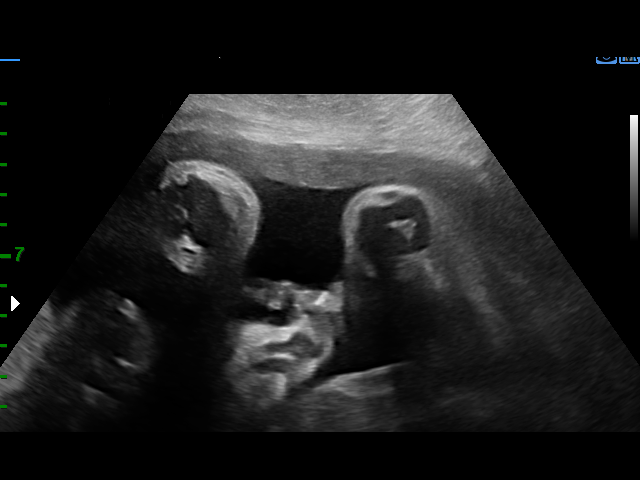
[im 12/44]
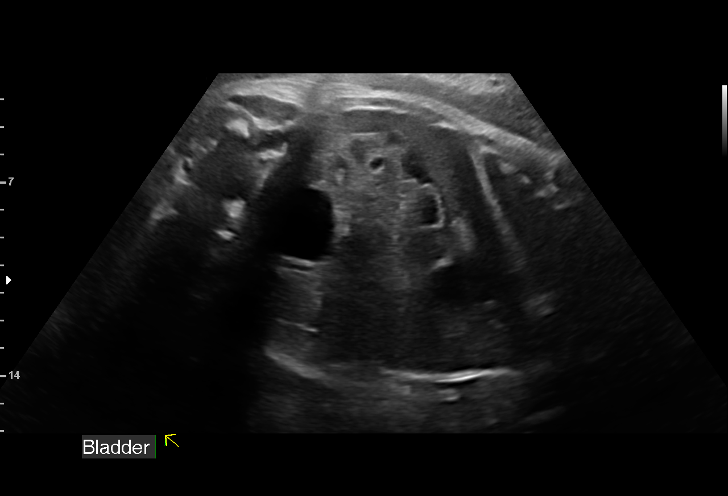
[im 15/44]
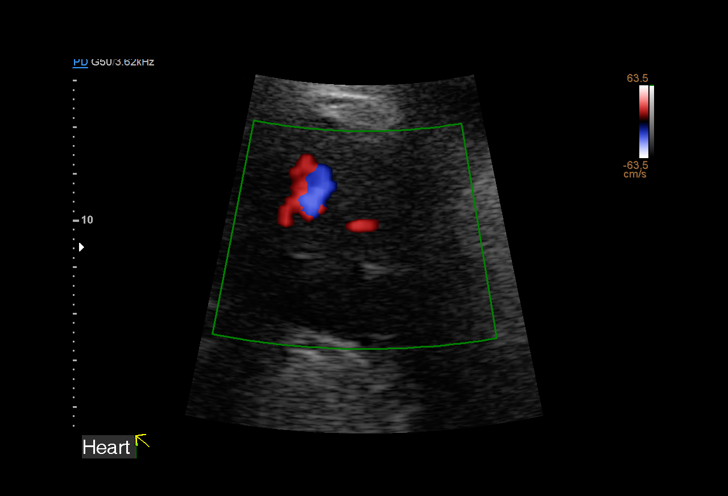
[im 18/44]
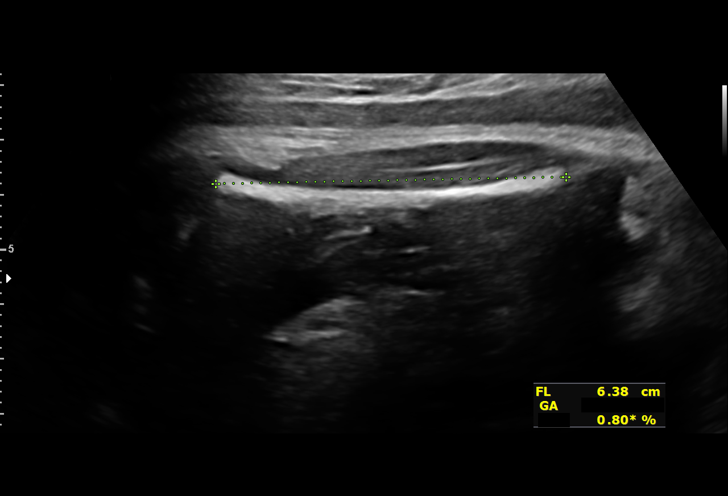
[im 21/44]
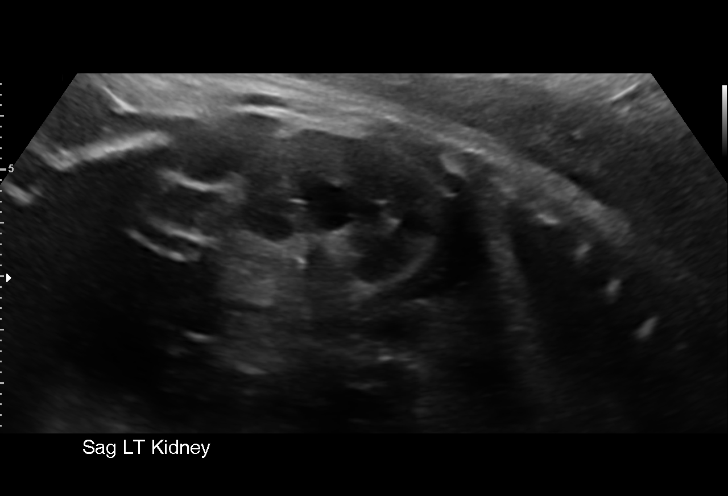
[im 24/44]
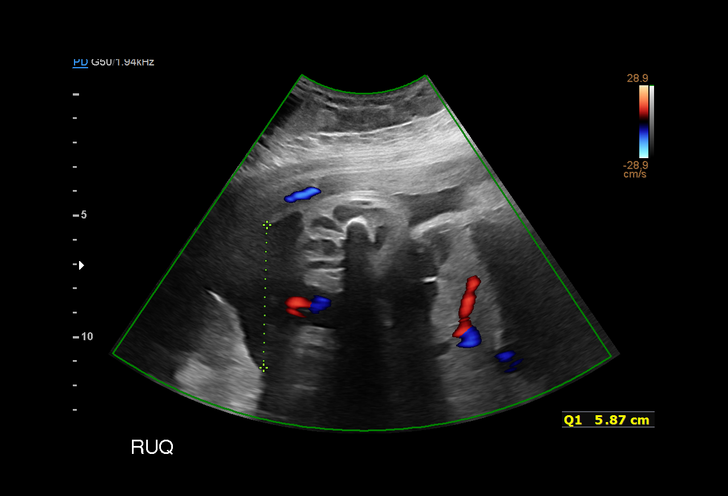
[im 28/44]
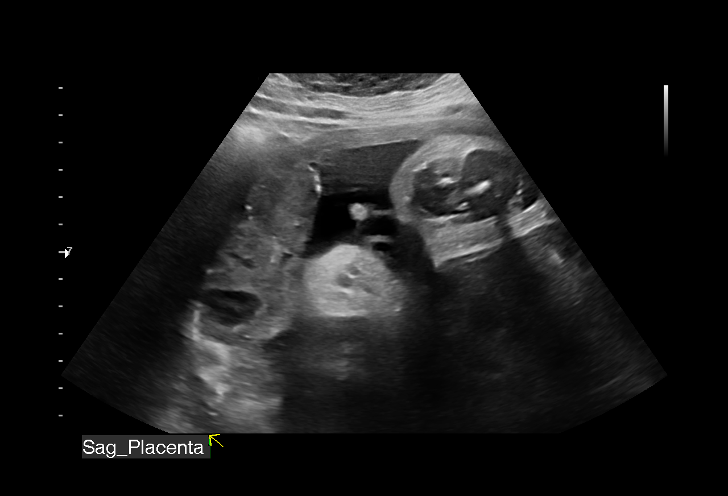
[im 31/44]
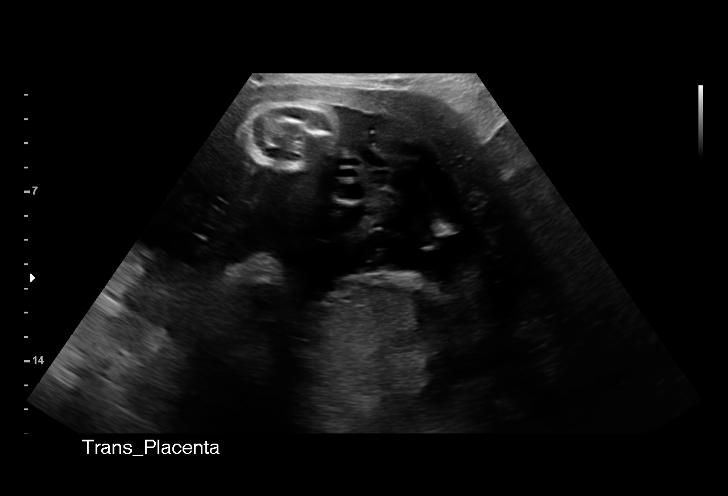
[im 34/44]
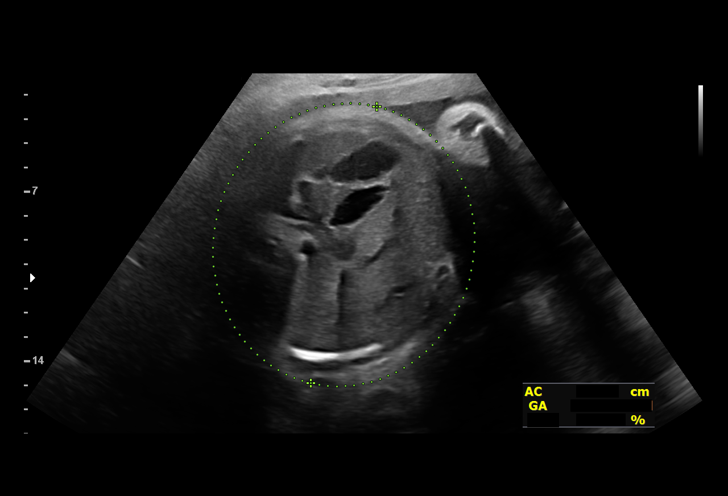
[im 37/44]
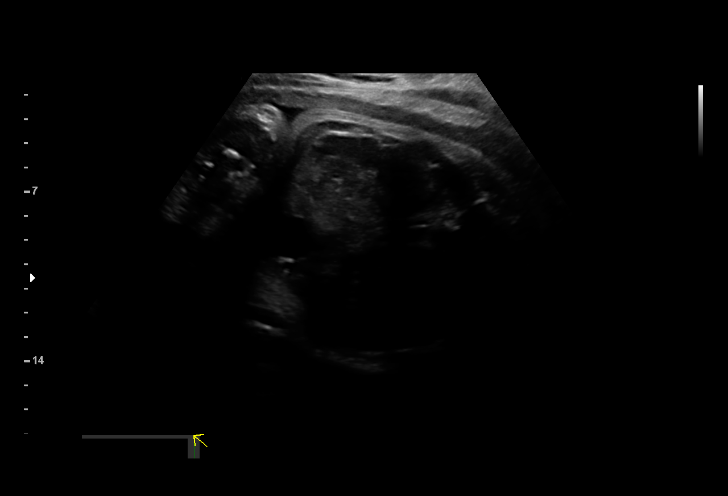
[im 40/44]
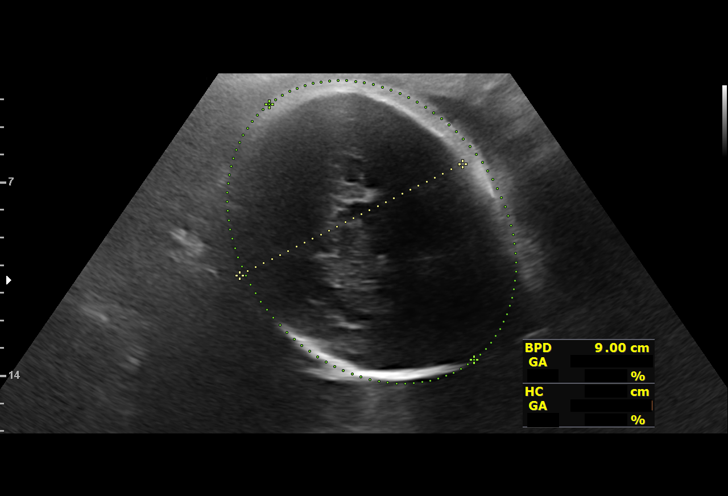
[im 44/44]
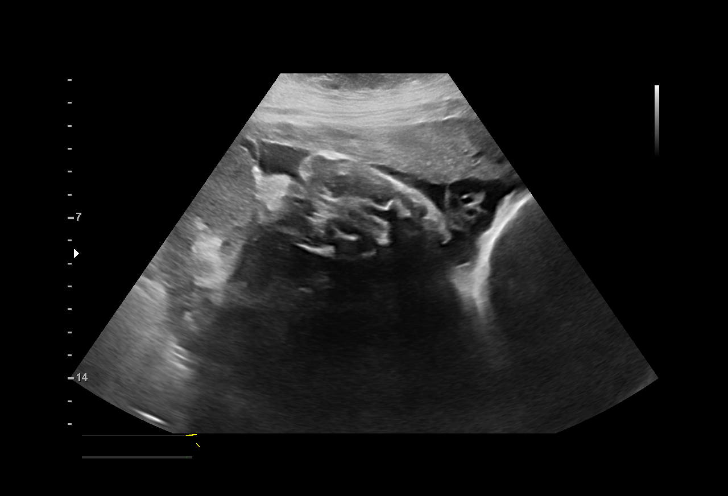

[14 of 28 positions shown; findings below may reference images not displayed]

BORGEN

                                                      DLEKE

Indications

 Cholestasis of pregnancy, third trimester      [id number]
 Gestational diabetes in pregnancy,
 controlled by oral hypoglycemic drugs
 Advanced maternal age multigravida 35+,
 third trimester
 Encounter for antenatal screening for
 malformations
 Advanced maternal age multigravida 35+,
 second trimester
 HIV affecting pregnancy, second trimester
 (Truvada, Tivicay)
 Obesity complicating pregnancy, second
 trimester (Pre-G BMI 38)
 Gestational diabetes in pregnancy,
 unspecified control
 36 weeks gestation of pregnancy
Fetal Evaluation

 Num Of Fetuses:         1
 Fetal Heart Rate(bpm):  144
 Cardiac Activity:       Observed
 Presentation:           Cephalic
 Placenta:               Posterior
 P. Cord Insertion:      Previously Visualized
 Amniotic Fluid
 AFI FV:      Within normal limits

 AFI Sum(cm)     %Tile       Largest Pocket(cm)
 12.14           38

 RUQ(cm)       RLQ(cm)       LUQ(cm)        LLQ(cm)
 5.87          0
Biophysical Evaluation

 Amniotic F.V:   Pocket => 2 cm             F. Tone:        Observed
 F. Movement:    Observed                   Score:          [DATE]
 F. Breathing:   Observed
Biometry

 BPD:      89.8  mm     G. Age:  36w 3d         63  %    CI:        72.59   %    70 - 86
                                                         FL/HC:      19.1   %    20.1 -
 HC:      335.2  mm     G. Age:  38w 3d         72  %    HC/AC:      0.97        0.93 -
 AC:      347.2  mm     G. Age:  38w 4d         98  %    FL/BPD:     71.3   %    71 - 87
 FL:         64  mm     G. Age:  33w 0d        < 1  %    FL/AC:      18.4   %    20 - 24

 Est. FW:    8658  gm    6 lb 14 oz      74  %
Gestational Age

 LMP:           39w 5d        Date:  07/24/19                 EDD:   04/29/20
 U/S Today:     36w 4d                                        EDD:   05/21/20
 Best:          36w 2d     Det. By:  Early Ultrasound         EDD:   05/23/20
                                     (11/09/19)
Anatomy

 Cranium:               Previously seen        LVOT:                   Previously seen
 Cavum:                 Previously seen        Aortic Arch:            Appears normal
 Ventricles:            Previously seen        Ductal Arch:            Previously seen
 Choroid Plexus:        Previously seen        Diaphragm:              Appears normal
 Cerebellum:            Previously seen        Stomach:                Appears normal, left
                                                                       sided
 Posterior Fossa:       Previously seen        Abdomen:                Appears normal
 Nuchal Fold:           Previously seen        Abdominal Wall:         Previously seen
 Face:                  Orbits and profile     Cord Vessels:           Previously seen
                        previously seen
 Lips:                  Previously seen        Kidneys:                Appear normal
 Palate:                Not well visualized    Bladder:                Appears normal
 Thoracic:              Previously seen        Spine:                  Previously seen
 Heart:                 Previously seen        Upper Extremities:      Previously seen
 RVOT:                  Previously seen        Lower Extremities:      Previously seen

 Other:  Previously fetus appears to be a male. VC, 3VV and 3VTV previously
         visualized. Technically difficult due to maternal habitus.
Cervix Uterus Adnexa

 Cervix
 Not visualized (advanced GA >78wks)
Impression

 Follow up growth with cholestatsis in pregnancy
 Normal interval growth with measurements consistent with
 dates
 Good fetal movement and amniotic fluid volume
 Biophysical profile [DATE]
Recommendations

 Scheduled delivery on [DATE] due to cholestasis.

## 2022-04-09 ENCOUNTER — Telehealth: Payer: Self-pay

## 2022-04-09 ENCOUNTER — Other Ambulatory Visit (HOSPITAL_COMMUNITY): Payer: Self-pay

## 2022-04-09 ENCOUNTER — Other Ambulatory Visit: Payer: Self-pay

## 2022-04-09 ENCOUNTER — Ambulatory Visit (INDEPENDENT_AMBULATORY_CARE_PROVIDER_SITE_OTHER): Payer: Medicaid Other | Admitting: Internal Medicine

## 2022-04-09 ENCOUNTER — Other Ambulatory Visit (HOSPITAL_COMMUNITY)
Admission: RE | Admit: 2022-04-09 | Discharge: 2022-04-09 | Disposition: A | Discharge status: Home / Self Care | Payer: Medicaid Other | Source: Ambulatory Visit | Attending: Internal Medicine | Admitting: Internal Medicine

## 2022-04-09 ENCOUNTER — Encounter: Payer: Self-pay | Admitting: Internal Medicine

## 2022-04-09 VITALS — BP 127/84 | HR 66 | Temp 98.2°F | Wt 212.0 lb

## 2022-04-09 DIAGNOSIS — B20 Human immunodeficiency virus [HIV] disease: Secondary | ICD-10-CM | POA: Diagnosis not present

## 2022-04-09 DIAGNOSIS — Z113 Encounter for screening for infections with a predominantly sexual mode of transmission: Secondary | ICD-10-CM | POA: Insufficient documentation

## 2022-04-09 DIAGNOSIS — G8929 Other chronic pain: Secondary | ICD-10-CM

## 2022-04-09 DIAGNOSIS — M545 Low back pain, unspecified: Secondary | ICD-10-CM

## 2022-04-09 MED ORDER — CABENUVA 600 & 900 MG/3ML IM SUER
1.0000 | INTRAMUSCULAR | 0 refills | Status: DC
  Filled 2022-04-09 – 2022-04-11 (×2): qty 6, 30d supply, fill #0

## 2022-04-09 NOTE — Telephone Encounter (Signed)
Contacted patient (with a spanish interpreter) to review Cabenu[place] adminstration, side effects, etc. The patient did not answer, so the interpreter left a voicemail for the patient to call back [facility] pharmacy to further discuss the medication and answer any questions she may have.

## 2022-04-09 NOTE — Addendum Note (Signed)
Addended byPrudencio Pair T on: 04/09/2022 03:34 PM   Modules accepted: Orders

## 2022-04-09 NOTE — Patient Instructions (Signed)
I suspect your back pain is due to chronic strain Recommend as needed tylenol for pain and stretching/core muscle exercise (you can look up on google how to do)  Xray of the back is ordered    Labs today   Continue biktarvy   I'll refer your case to our pharmacy team to see if you qualify for the injection medication for hiv    Please make sure you establish care with a primary care provider who can look over cancer screening and other primary care issues    See me in 6 months

## 2022-04-09 NOTE — Progress Notes (Addendum)
[place]  Patient Active Problem List   Diagnosis Date Noted   Postpartum care following cesarean delivery 05/30/2020   History of cesarean section 05/02/2020   Transient hypertension of pregnancy in third trimester 04/21/2020   Gestational diabetes 02/19/2020   Language barrier 11/13/2019   HIV (human immunodeficiency virus infection) ([place]) 11/13/2019   History of C-section 11/13/2019      Subjective:    Patient ID: Miranda George, female    DOB: 09-10-1980, 42 y.o.   MRN: [id number]  Cc: HIV care  HPI:  Miranda George is a 42 y.o. female here for continued HIV care  04/09/22 id clinic f/u 2 months lower back back no red flag and worse with activity not waking her up at night No b sx Feels well otherwise Remains in same marriage Baby doing well Compliant with biktarvy We discussed cabenuva and she is interested. She has medicaid She was referred to primary care previously but didn't keep appointment Needs pap/mammogram  09/19/21 id clinic Doing well  No complaint Her husband's hiv status is still a mystery to her -- they don't talk about this  She has no missed dose of biktarvy Her viral load is <20  She has no sx but ok with std testing    05/09/21 id f/u Headache is not as persistent now but still intense. She currently takes 2 times a week. Intensity is 7-8. Frontal/sinus distribution/post-orbital. Some light sensitivity. Episode lasts average 2-3 hours but some times could last the whole day. No focal weakness/numbness Discuss if she misses her biktarvy doses and she said no    04/11/21 id f/u She is here with language interpreter and her baby (35 months old)  Patient compliant with biktarvy Complains of 1 month of headache. Frontal. 7/10. Tylenol helps. No worsening factor. No visual change/numbness/tingling/weakness. Sometimes dizzi. Some time with chest discomfort. No jaw pain. No hearing  disturbance.  Not positional Not worse with straining/coughing Sudden onset No new medication No birth control pill No nasal congestion Some times light sensitivity; no noise sensitivity No fever, chill No smoker No sinus congestion  Eating drinking ok Drink coffee. 1 cup a day in morning for several years. Doesn't make headache worse/better  Patient no hx blood clot No family hx migraine  I reviewed her meds list -- Tylenol prn. biktarvy   9/27 id f/u No longer on metformin Takes biktarvy Virologically and immunologically well controlled No missed biktarvy dose last 4 weeks Baby boy is 5 months now, lives with mom (and husband, and older son). No issue. Not breast feeding. Seronegative No f/c, headache, rash, joint pain, vaginal discharge/pelvic pain Reports upper epigastric pain radiate to both sides -- since taking biktarvy Some heart burn No pcp yet -- trying to get internal medicine clinic here as pcp.  Doesn't take acid blocker Milk gives her some relief Denies taking nsaids  Last well woman exam/pap during pregnancy. She states she wants to do it here  Husband told her he tested twice and negative. Patient without other sexual exposure   06/20/20 id f/u Patient delivered healthy child 05/2020 Her hiv viral load has been negative several months, on ART, prior to delivery She continues to be compliant with ART with no missed dose Reviewed labs with her Her lft is a little elevated on 06/06/2020; previously exposed to hep a; 10/2019 hep b and c serology negative. She had no issues with peripartum hypertension or HELLP  I reviewed her immunization record; only one heplisav dose 12/2019. She doesn't recall getting the second dose yet She is without n/v/diarrhea/abd pain She does feel dizzy since delivery of her child; feels unsteady at times as well but no dyspnea/fatigue. She endorses turning head in any direction makes it worse.  She has good appetite She doesn't  breast feed because of the hiv  She is here with her newborn boy of 1 month. His prelim hiv test is negative; he is not on any hiv pill; he'll have repeat hiv testing again soon  Social review --  Lives with her husband Husband: hasn't repeated testing yet -- worried about not being able to pay Substance use: no smoking, drinking, drugs Work: housewife  03/07/20 id f/u Doing well Good prenatal care Gestational diabetes -- advised diet/exercise; no oral or insulin yet No f/c No vaginal bleeding/discharge No rash Husband still not getting hiv testing repeat/care yet; patient tried to call [facility] but no information yet Compliant with truvada/tivicay Reviewed labs; hiv undetectable   background -------------  She was seen in the ED on 12/03 for abd pain and vaginal bleeding, briefly admitted and dx'ed with subchorionic hematoma. Gc/chlamydia, wet mount negative. The vaginal bleeding had stopped yesterday, and the abdominal pain had also gone  #HIV Dx'ed 10/2019; heterosexual; denies ivdu/promiscuity; patient is married Patient had some blood transfusion during her 2nd pregnancy Husband tested and told her he was negative 2 months ago although there is some question as he tried to call back to the clinic and haven't heard Patient married 1 year; together for 4 years She was seen in [facility] on 10/28 with our pharmacist Cassie and started on tivicay and truvada. She had some nausea at the beginning but no longer. She doesn't miss any dose. 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, v179I/V, R211K PR mutation -- I62v, L63p, v77I/V, I93L There were no phenotypic resistance to NRTI, nnrti, or PI Baseline HIV labs: [id number] negative 11/12/2019 hepatitis b sAb, sAg, cAb; hep cAb negative; hep A Ab positive 11/12/2019 quant gold negative  She has fatigue since pregnancy and that hasn't changed She also endorsed some mild depression but no  hi/si (because of positive hiv)  No fever, chill, nightsweat No headache, visual change, blurriness No cough, chest pain, sob No sore throat No diarrhea No urinary urgency, dysuria, flank pain No bruising No joint pain, myalgia, muscle weakness No focal weakness No rash   No Known Allergies    Outpatient Medications Prior to Visit  Medication Sig Dispense Refill   ACCU-CHEK GUIDE test strip USE TO CHECK BLOOD SUGAR 4 TIMES DAILY     Accu-Chek Softclix Lancets lancets 4 (four) times daily.     acetaminophen (TYLENOL) 500 MG tablet Take 2 tablets (1,000 mg total) by mouth every 8 (eight) hours. 30 tablet 0   bictegravir-emtricitabine-tenofovir AF (BIKTARVY) 50-200-25 MG TABS tablet Take 1 tablet by mouth daily. 90 tablet 3   simethicone (MYLICON) 80 MG chewable tablet Chew 1 tablet (80 mg total) by mouth 3 (three) times daily after meals. (Patient not taking: Reported on 04/11/2021) 30 tablet 0   No facility-administered medications prior to visit.     Past Medical History:  Diagnosis Date   Anemia    Blood transfusion without reported diagnosis    Cholestasis during pregnancy    Fibroid    GERD (gastroesophageal reflux disease)    Gestational diabetes    HIV (  human immunodeficiency virus infection) ([place])       Past Surgical History:  Procedure Laterality Date   APPENDECTOMY     18 years ago   APPENDECTOMY     CESAREAN SECTION  2012   CESAREAN SECTION     CESAREAN SECTION N/A 05/02/2020   Procedure: CESAREAN SECTION;  Surgeon: Gwynne Edinger, MD;  Location: [facility];  Service: Obstetrics;  Laterality: N/A;      Family History  Problem Relation Age of Onset   Hypertension Mother    Diabetes Mother    Thyroid disease Mother    Hypertension Father    Diabetes Father       Social History   Socioeconomic History   Marital status: Married    Spouse name: Not on file   Number of children: Not on file   Years of education: Not on file   Highest  education level: Not on file  Occupational History   Not on file  Tobacco Use   Smoking status: Never   Smokeless tobacco: Never  Vaping Use   Vaping Use: Never used  Substance and Sexual Activity   Alcohol use: Never    Comment: occasionally   Drug use: Never   Sexual activity: Yes    Birth control/protection: None    Comment: declined condoms  Other Topics Concern   Not on file  Social History Narrative   ** Merged History Encounter **       Social Determinants of Health   Financial Resource Strain: Not on file  Food Insecurity: Food Insecurity Present (05/30/2020)   Hunger Vital Sign    Worried About Running Out of Food in the Last Year: Sometimes true    [place] in the Last Year: Sometimes true  Transportation Needs: No Transportation Needs (05/30/2020)   PRAPARE - Hydrologist (Medical): No    Lack of Transportation (Non-Medical): No  Physical Activity: Not on file  Stress: Not on file  Social Connections: Not on file  Intimate Partner Violence: Not on file           Objective:    BP 127/84   Pulse 66   Temp 98.2 F (36.8 C) (Oral)   Wt 212 lb (96.2 kg)   LMP 03/01/2022 (Approximate)   SpO2 96%   BMI 44.31 kg/m  Nursing note and vital signs reviewed.  Physical Exam General/constitutional: no distress, pleasant, obese HEENT: Normocephalic, PER, Conj Clear, EOMI, Oropharynx clear Neck supple CV: rrr no mrg Lungs: clear to auscultation, normal respiratory effort Abd: Soft, Nontender Ext: no edema Skin: No Rash Neuro: nonfocal MSK: no peripheral joint swelling/tenderness/warmth; back spines nontender       Labs: Reviewed Lab Results  Component Value Date   WBC 4.3 08/29/2021   HGB 13.6 08/29/2021   HCT 40.2 08/29/2021   MCV 88.9 08/29/2021   PLT 240 [id number]   Last metabolic panel Lab Results  Component Value Date   GLUCOSE 100 (H) 08/29/2021   NA 139 08/29/2021   K 3.8 08/29/2021   CL 106  08/29/2021   CO2 24 08/29/2021   BUN 10 08/29/2021   CREATININE 0.81 08/29/2021   GFRNONAA 101 06/20/2020   GFRAA 117 06/20/2020   CALCIUM 9.2 08/29/2021   PROT 7.4 08/29/2021   ALBUMIN 2.3 (L) 05/02/2020   LABGLOB 2.9 04/21/2020   AGRATIO 1.2 04/21/2020   BILITOT 0.4 08/29/2021   ALKPHOS 165 (H) 05/02/2020   AST 46 (  H) 08/29/2021   ALT 52 (H) 08/29/2021   ANIONGAP 5 05/02/2020    Hiv                cd4 (%)      /    hiv vl 08/2021                           /     <20 04/11/21                           /    41 10/11/20                           /    ud 02/14                                /    <20 12/28               340 (32)    /     32k  Serology: 03/2021 rpr nonreactive 12/18/19 urine gc/chlam and wetmount negative 10/28 quant gold negative; hep b/c negative; hep a reactive; rpr nonreactive 10/28 hla [id number] negative 10/28 hiv genotype no phenotypic resistance    Imaging: 04/22/21 brain mri FINDINGS: Brain: No acute infarct, mass effect or extra-axial collection. No acute or chronic hemorrhage. Normal white matter signal, parenchymal volume and CSF spaces. The midline structures are normal.   Vascular: Major flow voids are preserved.   Skull and upper cervical spine: Normal calvarium and skull base. Visualized upper cervical spine and soft tissues are normal.   Sinuses/Orbits:No paranasal sinus fluid levels or advanced mucosal thickening. No mastoid or middle ear effusion. Normal orbits.   IMPRESSION: Normal brain MRI.   Assessment & Plan:   Patient Active Problem List   Diagnosis Date Noted   Postpartum care following cesarean delivery 05/30/2020   History of cesarean section 05/02/2020   Transient hypertension of pregnancy in third trimester 04/21/2020   Gestational diabetes 02/19/2020   Language barrier 11/13/2019   HIV (human immunodeficiency virus infection) ([place]) 11/13/2019   History of C-section 11/13/2019     Problem List Items Addressed This  Visit   None #hiv dx'ed during pregnancy. Suspect transmission from husband. No other risk factors As of this visit she is not sure of her husband's hiv status. I have advised her to have an open discussion with her husband to get retested. 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 No evidence IRIS. Baseline cd4 prior to med 340 (32%)  Lab Results  Component Value Date   HIV1RNAQUANT <20 (H) 08/29/2021   Lab Results  Component Value Date   CD4TCELL 46 08/29/2021   CD4TABS 818 08/29/2021   Tolerating/compliant with biktarvy Doing well on biktarvy  04/09/22 discussed cabenuva and she is interested  -discussed u=u -encourage compliance -continue current HIV medication -labs today -refer to cabenuva for pharmacy team -f/u in 6 months     #headache Not present any longer   #back pain 2 months duration No red flag Suspect strain  -xray lumbar spine today -prn tylenol -advise stretch/core muscle exercise    #elevated lft Karlene Lineman ? Stable low grade lft elevation in setting obesity and recent pregnancy  -  continue to monitor   #hcm -vaccination prevnar 11/12/19 Pneumovax 06/2020 tdap 01/2020 covid moderna 2 shots by 07/2019 No meningococcal vaccine yet -hepatitis heplisav started 12/2019 finished 06/2020 -- repeat hep b sAb 09/2020 213 Will resend hepatitis b sAb quant end of 2024 to determine durability of heplisav -std screening 09/2021 urine std testing and rpr negative -annual 10/2019 quant gold negative 06/2020 rpr negative -woman's health Advise pcp establishment for other primary care issue such as breast cancer screening  --------- Addendum: She is eligible to get cabenuva from insurance standpoint Rx to [facility] Patient to get and have it administered at [facility]    Follow-up: Return in about 6 months (around  10/10/2022).     Jabier Mutton, [facility] [place] -- -- pager   [phone] cell 04/09/2022, 11:11 AM

## 2022-04-10 LAB — URINE CYTOLOGY ANCILLARY ONLY
Chlamydia: NEGATIVE
Comment: NEGATIVE
Comment: NEGATIVE
Comment: NORMAL
Neisseria Gonorrhea: NEGATIVE
Trichomonas: NEGATIVE

## 2022-04-11 ENCOUNTER — Telehealth: Payer: Self-pay

## 2022-04-11 ENCOUNTER — Other Ambulatory Visit: Payer: Self-pay

## 2022-04-11 ENCOUNTER — Other Ambulatory Visit (HOSPITAL_COMMUNITY): Payer: Self-pay

## 2022-04-11 NOTE — Telephone Encounter (Signed)
Called patient (using a spanish interpreter) to counsel on [place]. Counseled that [place] is two separate intramuscular injections in the gluteal muscle on each side for each visit. Explained that the second injection is 30 days after the initial injection then every 2 months thereafter. Discussed the rare but significant chance of developing resistance despite compliance. Explained that showing up to injection appointments is very important and warned that if 2 appointments are missed, it will be reassessed by their provider whether they are a good candidate for injection therapy. Counseled on possible side effects associated with the injections such as injection site pain, which is usually mild to moderate in nature, injection site nodules, and injection site reactions. Advised that she can take ibuprofen or tylenol for injection site pain if needed.   The patient wishes to proceed with Cabenuva therapy, so pharmacy will start working on insurance approval and we will call her to schedule an appointment once insurance has approved the medication. Asked the patient to call the clinic with any additional questions and gave her the [facility] phone number.

## 2022-04-12 ENCOUNTER — Telehealth: Payer: Self-pay

## 2022-04-12 LAB — COMPLETE METABOLIC PANEL WITH GFR
AG Ratio: 1.4 (calc) (ref 1.0–2.5)
ALT: 63 U/L — ABNORMAL HIGH (ref 6–29)
AST: 45 U/L — ABNORMAL HIGH (ref 10–30)
Albumin: 4.3 g/dL (ref 3.6–5.1)
Alkaline phosphatase (APISO): 82 U/L (ref 31–125)
BUN: 12 mg/dL (ref 7–25)
CO2: 23 mmol/L (ref 20–32)
Calcium: 9.1 mg/dL (ref 8.6–10.2)
Chloride: 106 mmol/L (ref 98–110)
Creat: 0.7 mg/dL (ref 0.50–0.99)
Globulin: 3.1 g/dL (calc) (ref 1.9–3.7)
Glucose, Bld: 164 mg/dL — ABNORMAL HIGH (ref 65–99)
Potassium: 4 mmol/L (ref 3.5–5.3)
Sodium: 137 mmol/L (ref 135–146)
Total Bilirubin: 0.3 mg/dL (ref 0.2–1.2)
Total Protein: 7.4 g/dL (ref 6.1–8.1)
eGFR: 112 mL/min/{1.73_m2} (ref >=60)

## 2022-04-12 LAB — CBC
HCT: 42.2 % (ref 35.0–45.0)
Hemoglobin: 13.8 g/dL (ref 11.7–15.5)
MCH: 28.3 pg (ref 27.0–33.0)
MCHC: 32.7 g/dL (ref 32.0–36.0)
MCV: 86.7 fL (ref 80.0–100.0)
MPV: 10.2 fL (ref 7.5–12.5)
Platelets: 258 10*3/uL (ref 140–400)
RBC: 4.87 10*6/uL (ref 3.80–5.10)
RDW: 14 % (ref 11.0–15.0)
WBC: 6.1 10*3/uL (ref 3.8–10.8)

## 2022-04-12 LAB — HIV-1 RNA QUANT-NO REFLEX-BLD
HIV 1 RNA Quant: NOT DETECTED Copies/mL
HIV-1 RNA Quant, Log: NOT DETECTED Log cps/mL

## 2022-04-12 LAB — RPR: RPR Ser Ql: NONREACTIVE

## 2022-04-12 NOTE — Telephone Encounter (Signed)
Patient was approved for [place] through her insurance. Called to make an appointment for ~1 week from now. Patient had no new questions and is looking forward to the appointment. Pharmacy will see her on 04/24/22 for her first Cabenuva injections. Gave her the [facility] pharmacy phone number and encouraged her to call with any new questions/concerns.

## 2022-04-13 ENCOUNTER — Other Ambulatory Visit: Payer: Self-pay

## 2022-04-13 ENCOUNTER — Other Ambulatory Visit (HOSPITAL_COMMUNITY): Payer: Self-pay

## 2022-04-13 NOTE — Progress Notes (Signed)
Yup! She is coming in on 4/9 for first Cab with me

## 2022-04-16 ENCOUNTER — Telehealth: Payer: Self-pay

## 2022-04-16 NOTE — Telephone Encounter (Signed)
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

## 2022-04-16 NOTE — Telephone Encounter (Signed)
Left voicemail with assistance from [employer]. Voicemail did not disclose specific labs that were done.  Is scheduled for appt on 4/9. Leatrice Jewels, RMA

## 2022-04-17 ENCOUNTER — Telehealth: Payer: Self-pay | Admitting: Pharmacist

## 2022-04-17 NOTE — Telephone Encounter (Signed)
Patient's specialty medication Kern Reap) was delivered from [facility] and will be administered at next office visit on 4/9.  Alfonse Spruce, PharmD, CPP, BCIDP, [place] Clinical Pharmacist Practitioner Infectious [place] [facility] Infect[facility]

## 2022-04-24 ENCOUNTER — Ambulatory Visit (INDEPENDENT_AMBULATORY_CARE_PROVIDER_SITE_OTHER): Payer: Medicaid Other | Admitting: Pharmacist

## 2022-04-24 ENCOUNTER — Other Ambulatory Visit: Payer: Self-pay

## 2022-04-24 ENCOUNTER — Other Ambulatory Visit (HOSPITAL_COMMUNITY)
Admission: RE | Admit: 2022-04-24 | Discharge: 2022-04-24 | Disposition: A | Discharge status: Home / Self Care | Payer: Medicaid Other | Source: Ambulatory Visit | Attending: Internal Medicine | Admitting: Internal Medicine

## 2022-04-24 DIAGNOSIS — Z113 Encounter for screening for infections with a predominantly sexual mode of transmission: Secondary | ICD-10-CM | POA: Insufficient documentation

## 2022-04-24 DIAGNOSIS — Z23 Encounter for immunization: Secondary | ICD-10-CM | POA: Diagnosis not present

## 2022-04-24 DIAGNOSIS — B20 Human immunodeficiency virus [HIV] disease: Secondary | ICD-10-CM

## 2022-04-24 MED ORDER — CABOTEGRAVIR & RILPIVIRINE ER 600 & 900 MG/3ML IM SUER
1.0000 | Freq: Once | INTRAMUSCULAR | Status: AC
Start: 2022-04-24 — End: 2022-04-24
  Administered 2022-04-24: 1 via INTRAMUSCULAR

## 2022-04-24 NOTE — Progress Notes (Signed)
NEW REFERRAL TO CPP CLINIC  

## 2022-04-24 NOTE — Progress Notes (Signed)
HPI: Miranda George is a 42 y.o. female who presents to the [facility] pharmacy clinic for [place] administration.  Patient Active Problem List   Diagnosis Date Noted   Postpartum care following cesarean delivery 05/30/2020   History of cesarean section 05/02/2020   Transient hypertension of pregnancy in third trimester 04/21/2020   Gestational diabetes 02/19/2020   Language barrier 11/13/2019   HIV (human immunodeficiency virus infection) 11/13/2019   History of C-section 11/13/2019    Patient's Medications  New Prescriptions   No medications on file  Previous Medications   ACCU-CHEK GUIDE TEST STRIP    USE TO CHECK BLOOD SUGAR 4 TIMES DAILY   ACCU-CHEK SOFTCLIX LANCETS LANCETS    4 (four) times daily.   ACETAMINOPHEN (TYLENOL) 500 MG TABLET    Take 2 tablets (1,000 mg total) by mouth every 8 (eight) hours.   CABOTEGRAVIR & RILPIVIRINE ER (CABENUVA) 600 & 900 MG/3ML INJECTION    Inject 1 kit into the muscle every 30 (thirty) days.   SIMETHICONE (MYLICON) 80 MG CHEWABLE TABLET    Chew 1 tablet (80 mg total) by mouth 3 (three) times daily after meals.  Modified Medications   No medications on file  Discontinued Medications   No medications on file    Allergies: No Known Allergies  Past Medical History: Past Medical History:  Diagnosis Date   Anemia    Blood transfusion without reported diagnosis    Cholestasis during pregnancy    Fibroid    GERD (gastroesophageal reflux disease)    Gestational diabetes    HIV (human immunodeficiency virus infection) ([facility])     Social History: Social History   Socioeconomic History   Marital status: Married    Spouse name: Not on file   Number of children: Not on file   Years of education: Not on file   Highest education level: Not on file  Occupational History   Not on file  Tobacco Use   Smoking status: Never   Smokeless tobacco: Never  Vaping Use   Vaping Use: Never used  Substance and Sexual Activity   Alcohol  use: Never    Comment: occasionally   Drug use: Never   Sexual activity: Yes    Birth control/protection: None    Comment: declined condoms  Other Topics Concern   Not on file  Social History Narrative   ** Merged History Encounter **       Social Determinants of Health   Financial Resource Strain: Not on file  Food Insecurity: Food Insecurity Present (05/30/2020)   Hunger Vital Sign    Worried About Running Out of Food in the Last Year: Sometimes true    Ran Out of Food in the Last Year: Sometimes true  Transportation Needs: No Transportation Needs (05/30/2020)   PRAPARE - Administrator, Civil Service (Medical): No    Lack of Transportation (Non-Medical): No  Physical Activity: Not on file  Stress: Not on file  Social Connections: Not on file    Labs: Lab Results  Component Value Date   HIV1RNAQUANT Not Detected 04/09/2022   HIV1RNAQUANT <20 (H) 08/29/2021   HIV1RNAQUANT 41 (H) 04/11/2021   CD4TABS 818 08/29/2021   CD4TABS 776 04/11/2021   CD4TABS 527 06/06/2020    RPR and STI Lab Results  Component Value Date   LABRPR NON-REACTIVE 04/09/2022   LABRPR NON-REACTIVE 09/19/2021   LABRPR NON-REACTIVE 04/11/2021   LABRPR NON REACTIVE 04/29/2020   LABRPR NON-REACTIVE 11/12/2019    STI  Results GC CT  04/09/2022 11:26 AM Negative  Negative   09/19/2021 10:55 AM Negative  Negative   03/18/2020  6:56 PM Negative  Negative   12/18/2019  8:07 PM Negative  Negative   11/12/2019 10:54 AM Negative  Negative   11/05/2019 12:00 AM  Negative      03/17/2019 12:00 AM Negative  Negative      This result is from an external source.    Hepatitis B Lab Results  Component Value Date   HEPBSAB NON-REACTIVE 11/12/2019   HEPBSAG NON-REACTIVE 06/20/2020   HEPBCAB NON-REACTIVE 06/20/2020   Hepatitis C Lab Results  Component Value Date   HEPCAB NON-REACTIVE 06/20/2020   Hepatitis A Lab Results  Component Value Date   HAV REACTIVE (A) 11/12/2019   Lipids: Lab  Results  Component Value Date   CHOL 154 11/12/2019   TRIG 198 (H) 11/12/2019   HDL 51 11/12/2019   CHOLHDL 3.0 11/12/2019   LDLCALC 73 11/12/2019    Current HIV Regimen: Biktarvy   TARGET DATE: The 9th  Assessment: Miranda George presents today for her first initiation injection for Cabenuva. Counseled that [place] is two separate intramuscular injections in the gluteal muscle on each side for each visit. Explained that the second injection is 30 days after the initial injection then every 2 months thereafter. Discussed the rare but significant chance of developing resistance despite compliance. Explained that showing up to injection appointments is very important and warned that if 2 appointments are missed, it will be reassessed by their provider whether they are a good candidate for injection therapy. Counseled on possible side effects associated with the injections such as injection site pain, which is usually mild to moderate in nature, injection site nodules, and injection site reactions. Asked to call the clinic or send me a mychart message if they experience any issues, such as fatigue, nausea, headache, rash, or dizziness. Advised that they can take ibuprofen or tylenol for injection site pain if needed.   Administered cabotegravir 600mg /45mL in left upper outer quadrant of the gluteal muscle. Administered rilpivirine 900 mg/92mL in the right upper outer quadrant of the gluteal muscle. Monitored patient for 10 minutes after injection. Injections were tolerated well without issue. Counseled to stop taking Biktarvy after today's dose and to call with any issues that may arise. Will make follow up appointments for second initiation injection in 30 days and then maintenance injections every 2 months thereafter.   Miranda George is eligible for the HPV vaccine series and meningococcal series. She agrees to start both of these today. She will follow up to complete these series over the next several  months. She opts for STI testing today. A urine cytology screen and RPR were ordered.   Plan: - Stop Biktarvy after today's dose - Follow up urine cytology and RPR  - First Cabenuva injections administered - Second initiation injection scheduled for May 9th, 2024 - Maintenance injections, second HPV vaccine, and second meningococcal vaccine scheduled for July 9th, 2024 - Call with any issues or questions  Blane Ohara, PharmD  PGY1 Pharmacy Resident

## 2022-04-25 ENCOUNTER — Ambulatory Visit
Admission: RE | Admit: 2022-04-25 | Discharge: 2022-04-25 | Disposition: A | Discharge status: Home / Self Care | Payer: Medicaid Other | Source: Ambulatory Visit | Attending: Internal Medicine | Admitting: Internal Medicine

## 2022-04-25 DIAGNOSIS — M545 Low back pain, unspecified: Secondary | ICD-10-CM | POA: Diagnosis not present

## 2022-04-25 DIAGNOSIS — G8929 Other chronic pain: Secondary | ICD-10-CM

## 2022-04-25 LAB — RPR: RPR Ser Ql: NONREACTIVE

## 2022-04-25 LAB — URINE CYTOLOGY ANCILLARY ONLY
Chlamydia: NEGATIVE
Comment: NEGATIVE
Comment: NORMAL
Neisseria Gonorrhea: NEGATIVE

## 2022-05-09 ENCOUNTER — Other Ambulatory Visit (HOSPITAL_COMMUNITY): Payer: Self-pay

## 2022-05-09 ENCOUNTER — Other Ambulatory Visit: Payer: Self-pay | Admitting: Pharmacist

## 2022-05-09 DIAGNOSIS — B20 Human immunodeficiency virus [HIV] disease: Secondary | ICD-10-CM

## 2022-05-09 MED ORDER — CABOTEGRAVIR & RILPIVIRINE ER 600 & 900 MG/3ML IM SUER
1.0000 | INTRAMUSCULAR | 1 refills | Status: DC
Start: 2022-05-09 — End: 2023-05-20
  Filled 2022-05-09 – 2022-05-17 (×2): qty 6, 30d supply, fill #0
  Filled 2022-07-12: qty 6, 30d supply, fill #1

## 2022-05-09 MED ORDER — CABOTEGRAVIR & RILPIVIRINE ER 600 & 900 MG/3ML IM SUER
1.0000 | INTRAMUSCULAR | 5 refills | Status: DC
Start: 2022-05-09 — End: 2023-05-20
  Filled 2022-05-09: qty 6, 60d supply, fill #0
  Filled 2022-09-06: qty 6, 34d supply, fill #0
  Filled 2022-10-23: qty 6, 34d supply, fill #1
  Filled 2023-01-02: qty 6, 34d supply, fill #2
  Filled 2023-03-04: qty 6, 34d supply, fill #3
  Filled 2023-04-17: qty 6, 34d supply, fill #4

## 2022-05-10 ENCOUNTER — Other Ambulatory Visit: Payer: Self-pay

## 2022-05-16 ENCOUNTER — Other Ambulatory Visit (HOSPITAL_COMMUNITY): Payer: Self-pay

## 2022-05-17 ENCOUNTER — Other Ambulatory Visit: Payer: Self-pay

## 2022-05-17 ENCOUNTER — Other Ambulatory Visit (HOSPITAL_COMMUNITY): Payer: Self-pay

## 2022-05-18 ENCOUNTER — Other Ambulatory Visit (HOSPITAL_COMMUNITY): Payer: Self-pay

## 2022-05-21 ENCOUNTER — Other Ambulatory Visit: Payer: Self-pay

## 2022-05-22 ENCOUNTER — Telehealth: Payer: Self-pay

## 2022-05-22 NOTE — Telephone Encounter (Signed)
[facility] Patient Advocate Encounter  Patient's medication (Cabenuva) have been couriered to [facility] from [facility] and will be administered on the patient next office visit on 05/24/22.  Dixie Coppa , CPhT Specialty Pharmacy Patient [facility] Phone: [phone] Fax:  [phone]  

## 2022-05-23 NOTE — Progress Notes (Unsigned)
HPI: Miranda George is a 42 y.o. female who presents to the [facility] pharmacy clinic for Miranda George administration.  Patient Active Problem List   Diagnosis Date Noted   Postpartum care following cesarean delivery 05/30/2020   History of cesarean section 05/02/2020   Transient hypertension of pregnancy in third trimester 04/21/2020   Gestational diabetes 02/19/2020   Language barrier 11/13/2019   HIV (human immunodeficiency virus infection) 11/13/2019   History of C-section 11/13/2019    Patient's Medications  New Prescriptions   No medications on file  Previous Medications   ACCU-CHEK GUIDE TEST STRIP    USE TO CHECK BLOOD SUGAR 4 TIMES DAILY   ACCU-CHEK SOFTCLIX LANCETS LANCETS    4 (four) times daily.   ACETAMINOPHEN (TYLENOL) 500 MG TABLET    Take 2 tablets (1,000 mg total) by mouth every 8 (eight) hours.   CABOTEGRAVIR & RILPIVIRINE ER (CABENUVA) 600 & 900 MG/3ML INJECTION    Inject 1 kit into the muscle every 30 (thirty) days.   SIMETHICONE (MYLICON) 80 MG CHEWABLE TABLET    Chew 1 tablet (80 mg total) by mouth 3 (three) times daily after meals.  Modified Medications   No medications on file  Discontinued Medications   No medications on file    Allergies: No Known Allergies  Past Medical History: Past Medical History:  Diagnosis Date   Anemia    Blood transfusion without reported diagnosis    Cholestasis during pregnancy    Fibroid    GERD (gastroesophageal reflux disease)    Gestational diabetes    HIV (human immunodeficiency virus infection) (HCC)     Social History: Social History   Socioeconomic History   Marital status: Married    Spouse name: Not on file   Number of children: Not on file   Years of education: Not on file   Highest education level: Not on file  Occupational History   Not on file  Tobacco Use   Smoking status: Never   Smokeless tobacco: Never  Vaping Use   Vaping Use: Never used  Substance and Sexual Activity   Alcohol  use: Never    Comment: occasionally   Drug use: Never   Sexual activity: Yes    Birth control/protection: None    Comment: declined condoms  Other Topics Concern   Not on file  Social History Narrative   ** Merged History Encounter **       Social Determinants of Health   Financial Resource Strain: Not on file  Food Insecurity: Food Insecurity Present (05/30/2020)   Hunger Vital Sign    Worried About Running Out of Food in the Last Year: Sometimes true    Ran Out of Food in the Last Year: Sometimes true  Transportation Needs: No Transportation Needs (05/30/2020)   PRAPARE - Administrator, Civil Service (Medical): No    Lack of Transportation (Non-Medical): No  Physical Activity: Not on file  Stress: Not on file  Social Connections: Not on file    Labs: Lab Results  Component Value Date   HIV1RNAQUANT Not Detected 04/09/2022   HIV1RNAQUANT <20 (H) 08/29/2021   HIV1RNAQUANT 41 (H) 04/11/2021   CD4TABS 818 08/29/2021   CD4TABS 776 04/11/2021   CD4TABS 527 06/06/2020    RPR and STI Lab Results  Component Value Date   LABRPR NON-REACTIVE 04/09/2022   LABRPR NON-REACTIVE 09/19/2021   LABRPR NON-REACTIVE 04/11/2021   LABRPR NON REACTIVE 04/29/2020   LABRPR NON-REACTIVE 11/12/2019    STI  Results GC CT  04/09/2022 11:26 AM Negative  Negative   09/19/2021 10:55 AM Negative  Negative   03/18/2020  6:56 PM Negative  Negative   12/18/2019  8:07 PM Negative  Negative   11/12/2019 10:54 AM Negative  Negative   11/05/2019 12:00 AM  Negative      03/17/2019 12:00 AM Negative  Negative      This result is from an external source.    Hepatitis B Lab Results  Component Value Date   HEPBSAB NON-REACTIVE 11/12/2019   HEPBSAG NON-REACTIVE 06/20/2020   HEPBCAB NON-REACTIVE 06/20/2020   Hepatitis C Lab Results  Component Value Date   HEPCAB NON-REACTIVE 06/20/2020   Hepatitis A Lab Results  Component Value Date   HAV REACTIVE (A) 11/12/2019   Lipids: Lab  Results  Component Value Date   CHOL 154 11/12/2019   TRIG 198 (H) 11/12/2019   HDL 51 11/12/2019   CHOLHDL 3.0 11/12/2019   LDLCALC 73 11/12/2019    Current HIV Regimen: Biktarvy   TARGET DATE: The 9th  Assessment: [name] presents today to complete her Cabenuva initiation. ** Any adverse effects   Administered cabotegravir 600mg /59mL in left upper outer quadrant of the gluteal muscle. Administered rilpivirine 900 mg/19mL in the right upper outer quadrant of the gluteal muscle. Monitored patient for 10 minutes after injection. Injections were tolerated well without issue. Counseled to stop taking Biktarvy after today's dose and to call with any issues that may arise. Will make follow up appointments for second initiation injection in 30 days and then maintenance injections every 2 months thereafter.   [name] is eligible for the HPV vaccine series and meningococcal series. She agrees to start both of these today. She will follow up to complete these series over the next several months. She opts for STI testing today. A urine cytology screen and RPR were ordered.   Plan: - Stop Biktarvy after today's dose - Follow up urine cytology and RPR  - First Cabenuva injections administered - Second initiation injection scheduled for May 9th, 2024 - Maintenance injections, second HPV vaccine, and second meningococcal vaccine scheduled for July 9th, 2024 - Call with any issues or questions  Blane Ohara, PharmD  PGY1 Pharmacy Resident

## 2022-05-24 ENCOUNTER — Ambulatory Visit (INDEPENDENT_AMBULATORY_CARE_PROVIDER_SITE_OTHER): Payer: Medicaid Other

## 2022-05-24 ENCOUNTER — Other Ambulatory Visit: Payer: Self-pay

## 2022-05-24 ENCOUNTER — Ambulatory Visit (INDEPENDENT_AMBULATORY_CARE_PROVIDER_SITE_OTHER): Payer: Medicaid Other | Admitting: Pharmacist

## 2022-05-24 ENCOUNTER — Other Ambulatory Visit: Payer: Self-pay | Admitting: Pharmacist

## 2022-05-24 ENCOUNTER — Other Ambulatory Visit (HOSPITAL_COMMUNITY)
Admission: RE | Admit: 2022-05-24 | Discharge: 2022-05-24 | Disposition: A | Discharge status: Home / Self Care | Payer: Medicaid Other | Source: Ambulatory Visit | Attending: Internal Medicine | Admitting: Internal Medicine

## 2022-05-24 DIAGNOSIS — B20 Human immunodeficiency virus [HIV] disease: Secondary | ICD-10-CM | POA: Diagnosis not present

## 2022-05-24 DIAGNOSIS — Z113 Encounter for screening for infections with a predominantly sexual mode of transmission: Secondary | ICD-10-CM

## 2022-05-24 DIAGNOSIS — Z23 Encounter for immunization: Secondary | ICD-10-CM | POA: Diagnosis not present

## 2022-05-24 MED ORDER — CABOTEGRAVIR & RILPIVIRINE ER 600 & 900 MG/3ML IM SUER
1.0000 | Freq: Once | INTRAMUSCULAR | Status: AC
Start: 2022-05-24 — End: 2022-05-24
  Administered 2022-05-24: 1 via INTRAMUSCULAR

## 2022-05-25 LAB — CYTOLOGY, (ORAL, ANAL, URETHRAL) ANCILLARY ONLY
Chlamydia: NEGATIVE
Comment: NEGATIVE
Comment: NORMAL
Neisseria Gonorrhea: NEGATIVE

## 2022-05-25 LAB — URINE CYTOLOGY ANCILLARY ONLY
Chlamydia: NEGATIVE
Comment: NEGATIVE
Comment: NORMAL
Neisseria Gonorrhea: NEGATIVE

## 2022-05-26 LAB — HIV-1 RNA QUANT-NO REFLEX-BLD
HIV 1 RNA Quant: <20 Copies/mL — ABNORMAL HIGH
HIV-1 RNA Quant, Log: <1.3 Log cps/mL — ABNORMAL HIGH

## 2022-05-26 LAB — RPR: RPR Ser Ql: NONREACTIVE

## 2022-06-28 NOTE — Progress Notes (Signed)
The 10-year ASCVD risk score (Arnett DK, et al., 2019) is: 0.4%   Values used to calculate the score:     Age: 42 years     Sex: Female     Is Non-Hispanic African American: No     Diabetic: No     Tobacco smoker: No     Systolic Blood Pressure: 127 mmHg     Is BP treated: No     HDL Cholesterol: 51 mg/dL     Total Cholesterol: 154 mg/dL  Sandie Ano, RN

## 2022-07-10 ENCOUNTER — Other Ambulatory Visit (HOSPITAL_COMMUNITY): Payer: Self-pay

## 2022-07-12 ENCOUNTER — Other Ambulatory Visit (HOSPITAL_COMMUNITY): Payer: Self-pay

## 2022-07-13 ENCOUNTER — Other Ambulatory Visit (HOSPITAL_COMMUNITY): Payer: Self-pay

## 2022-07-16 ENCOUNTER — Telehealth: Payer: Self-pay | Admitting: Pharmacy Technician

## 2022-07-16 NOTE — Telephone Encounter (Signed)
[facility] Patient Advocate Encounter  Patient's medication, Miranda George has been couriered to [facility] from [facility] and will be administered at pt's next visit  07/24/22.

## 2022-07-24 ENCOUNTER — Ambulatory Visit: Payer: Medicaid Other | Admitting: Pharmacist

## 2022-07-31 ENCOUNTER — Other Ambulatory Visit: Payer: Self-pay

## 2022-07-31 ENCOUNTER — Ambulatory Visit (INDEPENDENT_AMBULATORY_CARE_PROVIDER_SITE_OTHER): Payer: Medicaid Other | Admitting: Pharmacist

## 2022-07-31 DIAGNOSIS — Z23 Encounter for immunization: Secondary | ICD-10-CM

## 2022-07-31 DIAGNOSIS — B20 Human immunodeficiency virus [HIV] disease: Secondary | ICD-10-CM

## 2022-07-31 MED ORDER — CABOTEGRAVIR & RILPIVIRINE ER 600 & 900 MG/3ML IM SUER
1.0000 | Freq: Once | INTRAMUSCULAR | Status: AC
Start: 2022-07-31 — End: 2022-07-31
  Administered 2022-07-31: 1 via INTRAMUSCULAR

## 2022-07-31 NOTE — Progress Notes (Signed)
HPI: Miranda George is a 42 y.o. female who presents to the [facility] pharmacy clinic for [place] administration.  Patient Active Problem List   Diagnosis Date Noted   Postpartum care following cesarean delivery 05/30/2020   History of cesarean section 05/02/2020   Transient hypertension of pregnancy in third trimester 04/21/2020   Gestational diabetes 02/19/2020   Language barrier 11/13/2019   HIV (human immunodeficiency virus infection) (HCC) 11/13/2019   History of C-section 11/13/2019    Patient's Medications  New Prescriptions   No medications on file  Previous Medications   ACCU-CHEK GUIDE TEST STRIP    USE TO CHECK BLOOD SUGAR 4 TIMES DAILY   ACCU-CHEK SOFTCLIX LANCETS LANCETS    4 (four) times daily.   ACETAMINOPHEN (TYLENOL) 500 MG TABLET    Take 2 tablets (1,000 mg total) by mouth every 8 (eight) hours.   CABOTEGRAVIR & RILPIVIRINE ER (CABENUVA) 600 & 900 MG/3ML INJECTION    Inject 1 kit into the muscle every 2 (two) months.   CABOTEGRAVIR & RILPIVIRINE ER (CABENUVA) 600 & 900 MG/3ML INJECTION    Inject 1 kit into the muscle every 30 (thirty) days.   SIMETHICONE (MYLICON) 80 MG CHEWABLE TABLET    Chew 1 tablet (80 mg total) by mouth 3 (three) times daily after meals.  Modified Medications   No medications on file  Discontinued Medications   No medications on file    Allergies: No Known Allergies  Labs: Lab Results  Component Value Date   HIV1RNAQUANT <20 (H) 05/24/2022   HIV1RNAQUANT Not Detected 04/09/2022   HIV1RNAQUANT <20 (H) 08/29/2021   CD4TABS 818 08/29/2021   CD4TABS 776 04/11/2021   CD4TABS 527 06/06/2020    RPR and STI Lab Results  Component Value Date   LABRPR NON-REACTIVE 05/24/2022   LABRPR NON-REACTIVE 04/24/2022   LABRPR NON-REACTIVE 04/09/2022   LABRPR NON-REACTIVE 09/19/2021   LABRPR NON-REACTIVE 04/11/2021    STI Results GC CT  05/24/2022 10:16 AM Negative  Negative   05/24/2022  9:45 AM Negative  Negative   04/24/2022 10:09  AM Negative  Negative   04/09/2022 11:26 AM Negative  Negative   09/19/2021 10:55 AM Negative  Negative   03/18/2020  6:56 PM Negative  Negative   12/18/2019  8:07 PM Negative  Negative   11/12/2019 10:54 AM Negative  Negative   11/05/2019 12:00 AM  Negative      03/17/2019 12:00 AM Negative  Negative      This result is from an external source.    Hepatitis B Lab Results  Component Value Date   HEPBSAB NON-REACTIVE 11/12/2019   HEPBSAG NON-REACTIVE 06/20/2020   HEPBCAB NON-REACTIVE 06/20/2020   Hepatitis C Lab Results  Component Value Date   HEPCAB NON-REACTIVE 06/20/2020   Hepatitis A Lab Results  Component Value Date   HAV REACTIVE (A) 11/12/2019   Lipids: Lab Results  Component Value Date   CHOL 154 11/12/2019   TRIG 198 (H) 11/12/2019   HDL 51 11/12/2019   CHOLHDL 3.0 11/12/2019   LDLCALC 73 11/12/2019    TARGET DATE: The 9th  Assessment: Miranda George presents today for her maintenance Cabenuva injections. Past injections were tolerated well without issues. She is due for her 2nd Menveo vaccine today. Last HIV RNA was undetectable in May. Will check again today.   Administered cabotegravir 600mg /35mL in left upper outer quadrant of the gluteal muscle. Administered rilpivirine 900 mg/23mL in the right upper outer quadrant of the gluteal muscle. No issues with injections. She  will follow up in 2 months for next set of injections.  Plan: - Cabenuva injections administered - 2nd Menveo today - Next injections scheduled for 09/20/22 with Dr. Renold Don and 11/19/22 with me - Call with any issues or questions  Trev Boley L. Avyn Aden, PharmD, BCIDP, AAHIVP, CPP Clinical Pharmacist Practitioner Infectious Diseases Clinical Pharmacist [facility]

## 2022-08-02 LAB — HIV-1 RNA QUANT-NO REFLEX-BLD
HIV 1 RNA Quant: <20 Copies/mL — ABNORMAL HIGH
HIV-1 RNA Quant, Log: <1.3 Log cps/mL — ABNORMAL HIGH

## 2022-08-28 ENCOUNTER — Ambulatory Visit: Payer: Medicaid Other | Attending: Internal Medicine | Admitting: Internal Medicine

## 2022-09-01 DIAGNOSIS — Z8742 Personal history of other diseases of the female genital tract: Secondary | ICD-10-CM | POA: Diagnosis not present

## 2022-09-06 ENCOUNTER — Other Ambulatory Visit (HOSPITAL_COMMUNITY): Payer: Self-pay

## 2022-09-06 ENCOUNTER — Other Ambulatory Visit: Payer: Self-pay

## 2022-09-07 ENCOUNTER — Other Ambulatory Visit: Payer: Self-pay

## 2022-09-07 ENCOUNTER — Other Ambulatory Visit (HOSPITAL_COMMUNITY): Payer: Self-pay

## 2022-09-07 DIAGNOSIS — R1030 Lower abdominal pain, unspecified: Secondary | ICD-10-CM | POA: Diagnosis not present

## 2022-09-07 DIAGNOSIS — K760 Fatty (change of) liver, not elsewhere classified: Secondary | ICD-10-CM | POA: Diagnosis not present

## 2022-09-09 ENCOUNTER — Emergency Department (HOSPITAL_COMMUNITY): Payer: Medicaid Other

## 2022-09-09 ENCOUNTER — Emergency Department (HOSPITAL_COMMUNITY)
Admission: EM | Admit: 2022-09-09 | Discharge: 2022-09-09 | Disposition: A | Discharge status: Home / Self Care | Payer: Medicaid Other | Attending: Emergency Medicine | Admitting: Emergency Medicine

## 2022-09-09 ENCOUNTER — Other Ambulatory Visit: Payer: Self-pay

## 2022-09-09 DIAGNOSIS — D259 Leiomyoma of uterus, unspecified: Secondary | ICD-10-CM | POA: Diagnosis not present

## 2022-09-09 DIAGNOSIS — R739 Hyperglycemia, unspecified: Secondary | ICD-10-CM | POA: Diagnosis not present

## 2022-09-09 DIAGNOSIS — K760 Fatty (change of) liver, not elsewhere classified: Secondary | ICD-10-CM | POA: Diagnosis not present

## 2022-09-09 DIAGNOSIS — D219 Benign neoplasm of connective and other soft tissue, unspecified: Secondary | ICD-10-CM

## 2022-09-09 DIAGNOSIS — E1165 Type 2 diabetes mellitus with hyperglycemia: Secondary | ICD-10-CM | POA: Diagnosis not present

## 2022-09-09 DIAGNOSIS — R1032 Left lower quadrant pain: Secondary | ICD-10-CM | POA: Diagnosis not present

## 2022-09-09 DIAGNOSIS — Z21 Asymptomatic human immunodeficiency virus [HIV] infection status: Secondary | ICD-10-CM | POA: Diagnosis not present

## 2022-09-09 DIAGNOSIS — R109 Unspecified abdominal pain: Secondary | ICD-10-CM | POA: Diagnosis not present

## 2022-09-09 DIAGNOSIS — N83202 Unspecified ovarian cyst, left side: Secondary | ICD-10-CM | POA: Diagnosis not present

## 2022-09-09 LAB — WET PREP, GENITAL
Clue Cells Wet Prep HPF POC: NONE SEEN
Sperm: NONE SEEN
Trich, Wet Prep: NONE SEEN
WBC, Wet Prep HPF POC: <10 (ref <10)
Yeast Wet Prep HPF POC: NONE SEEN

## 2022-09-09 LAB — URINALYSIS, ROUTINE W REFLEX MICROSCOPIC
Bilirubin Urine: NEGATIVE
Glucose, UA: 150 mg/dL — AB
Hgb urine dipstick: NEGATIVE
Ketones, ur: NEGATIVE mg/dL
Leukocytes,Ua: NEGATIVE
Nitrite: NEGATIVE
Protein, ur: NEGATIVE mg/dL
Specific Gravity, Urine: 1.014 (ref 1.005–1.030)
pH: 6 (ref 5.0–8.0)

## 2022-09-09 LAB — COMPREHENSIVE METABOLIC PANEL
ALT: 58 U/L — ABNORMAL HIGH (ref 0–44)
AST: 43 U/L — ABNORMAL HIGH (ref 15–41)
Albumin: 3.6 g/dL (ref 3.5–5.0)
Alkaline Phosphatase: 61 U/L (ref 38–126)
Anion gap: 10 (ref 5–15)
BUN: 9 mg/dL (ref 6–20)
CO2: 24 mmol/L (ref 22–32)
Calcium: 8.9 mg/dL (ref 8.9–10.3)
Chloride: 102 mmol/L (ref 98–111)
Creatinine, Ser: 0.76 mg/dL (ref 0.44–1.00)
GFR, Estimated: >60 mL/min (ref >60)
Glucose, Bld: 191 mg/dL — ABNORMAL HIGH (ref 70–99)
Potassium: 4 mmol/L (ref 3.5–5.1)
Sodium: 136 mmol/L (ref 135–145)
Total Bilirubin: 0.9 mg/dL (ref 0.3–1.2)
Total Protein: 6.8 g/dL (ref 6.5–8.1)

## 2022-09-09 LAB — CBC
HCT: 39.1 % (ref 36.0–46.0)
Hemoglobin: 12.7 g/dL (ref 12.0–15.0)
MCH: 28.2 pg (ref 26.0–34.0)
MCHC: 32.5 g/dL (ref 30.0–36.0)
MCV: 86.7 fL (ref 80.0–100.0)
Platelets: 231 10*3/uL (ref 150–400)
RBC: 4.51 MIL/uL (ref 3.87–5.11)
RDW: 13.1 % (ref 11.5–15.5)
WBC: 6.2 10*3/uL (ref 4.0–10.5)
nRBC: 0 % (ref 0.0–0.2)

## 2022-09-09 LAB — LIPASE, BLOOD: Lipase: 39 U/L (ref 11–51)

## 2022-09-09 LAB — HCG, SERUM, QUALITATIVE: Preg, Serum: NEGATIVE

## 2022-09-09 MED ORDER — ONDANSETRON HCL 4 MG/2ML IJ SOLN
4.0000 mg | Freq: Once | INTRAMUSCULAR | Status: AC
  Administered 2022-09-09: 4 mg via INTRAVENOUS
  Filled 2022-09-09: qty 2

## 2022-09-09 MED ORDER — MORPHINE SULFATE (PF) 4 MG/ML IV SOLN
4.0000 mg | Freq: Once | INTRAVENOUS | Status: AC
  Administered 2022-09-09: 4 mg via INTRAVENOUS
  Filled 2022-09-09: qty 1

## 2022-09-09 MED ORDER — SODIUM CHLORIDE 0.9 % IV BOLUS
1000.0000 mL | Freq: Once | INTRAVENOUS | Status: AC
  Administered 2022-09-09: 1000 mL via INTRAVENOUS

## 2022-09-09 MED ORDER — HYDROCODONE-ACETAMINOPHEN 5-325 MG PO TABS: 1.0000 | ORAL_TABLET | ORAL | 0 refills | Status: AC | PRN

## 2022-09-09 MED ORDER — IBUPROFEN 600 MG PO TABS: 600.0000 mg | ORAL_TABLET | Freq: Four times a day (QID) | ORAL | 0 refills | Status: AC | PRN

## 2022-09-09 NOTE — ED Provider Notes (Signed)
[place] EMERGENCY DEPARTMENT AT [facility] Provider Note   CSN: [id number] Arrival date & time: 09/09/22  1000     History  Chief Complaint  Patient presents with   Abdominal Pain    Miranda George is a 42 y.o. female.  Pt is a 42 yo female with pmhx significant for HIV, fibroids, anemia, and ovarian cysts.  She has been having LLQ pain for several days.  She did go to UC on 8/17 for the same.  She was given toradol in the office and told to f/u with gyn.  Pt has not done so.  Pt denies f/c.  No n/v.  LMP 8/18.  Due to language barrier, an interpreter was present during the history-taking and subsequent discussion (and for part of the physical exam) with this patient.        Home Medications Prior to Admission medications   Medication Sig Start Date End Date Taking? Authorizing Provider  HYDROcodone-acetaminophen (NORCO/VICODIN) 5-325 MG tablet Take 1 tablet by mouth every 4 (four) hours as needed. 09/09/22  Yes Jacalyn Lefevre, MD  ibuprofen (ADVIL) 600 MG tablet Take 1 tablet (600 mg total) by mouth every 6 (six) hours as needed. 09/09/22  Yes Jacalyn Lefevre, MD  ACCU-CHEK GUIDE test strip USE TO CHECK BLOOD SUGAR 4 TIMES DAILY 05/11/20   [provider]  Accu-Chek Softclix Lancets lancets 4 (four) times daily. 05/11/20   [provider]  acetaminophen (TYLENOL) 500 MG tablet Take 2 tablets (1,000 mg total) by mouth every 8 (eight) hours. 05/05/20   Maness, Loistine Chance, MD  cabotegravir & rilpivirine ER (CABENUVA) 600 & 900 MG/3ML injection Inject 1 kit into the muscle every 2 (two) months. 05/09/22   Kuppelweiser, Cassie L, RPH-CPP  cabotegravir & rilpivirine ER (CABENUVA) 600 & 900 MG/3ML injection Inject 1 kit into the muscle every 30 (thirty) days. 05/09/22   Kuppelweiser, Cassie L, RPH-CPP  simethicone (MYLICON) 80 MG chewable tablet Chew 1 tablet (80 mg total) by mouth 3 (three) times daily after meals. Patient not taking: Reported on  04/11/2021 05/05/20   Jovita Kussmaul, MD      Allergies    Patient has no known allergies.    Review of Systems   Review of Systems  Gastrointestinal:  Positive for abdominal pain.  All other systems reviewed and are negative.   Physical Exam Updated Vital Signs BP 127/71   Pulse 66   Temp (!) 97.4 F (36.3 C)   Resp 18   LMP 09/02/2022 (Approximate)   SpO2 100%   Breastfeeding No  Physical Exam Vitals and nursing note reviewed.  Constitutional:      Appearance: She is well-developed.  HENT:     Head: Normocephalic and atraumatic.     Mouth/Throat:     Mouth: Mucous membranes are moist.  Eyes:     Extraocular Movements: Extraocular movements intact.     Pupils: Pupils are equal, round, and reactive to light.  Cardiovascular:     Rate and Rhythm: Normal rate and regular rhythm.     Heart sounds: Normal heart sounds.  Pulmonary:     Effort: Pulmonary effort is normal.     Breath sounds: Normal breath sounds.  Abdominal:     General: Abdomen is flat. Bowel sounds are normal.     Palpations: Abdomen is soft.     Tenderness: There is abdominal tenderness in the left lower quadrant.  Skin:    General: Skin is warm.     Capillary  Refill: Capillary refill takes less than 2 seconds.  Neurological:     General: No focal deficit present.     Mental Status: She is alert and oriented to person, place, and time.  Psychiatric:        Mood and Affect: Mood normal.        Behavior: Behavior normal.     ED Results / Procedures / Treatments   Labs (all labs ordered are listed, but only abnormal results are displayed) Labs Reviewed  COMPREHENSIVE METABOLIC PANEL - Abnormal; Notable for the following components:      Result Value   Glucose, Bld 191 (*)    AST 43 (*)    ALT 58 (*)    All other components within normal limits  URINALYSIS, ROUTINE W REFLEX MICROSCOPIC - Abnormal; Notable for the following components:   Glucose, UA 150 (*)    All other components within normal  limits  WET PREP, GENITAL  LIPASE, BLOOD  CBC  HCG, SERUM, QUALITATIVE  GC/CHLAMYDIA PROBE AMP ([place]) NOT AT [facility]    EKG None  Radiology CT Renal Stone Study  Result Date: 09/09/2022 CLINICAL DATA:  Flank pain EXAM: CT ABDOMEN AND PELVIS WITHOUT CONTRAST TECHNIQUE: Multidetector CT imaging of the abdomen and pelvis was performed following the standard protocol without IV contrast. RADIATION DOSE REDUCTION: This exam was performed according to the departmental dose-optimization program which includes automated exposure control, adjustment of the mA and/or kV according to patient size and/or use of iterative reconstruction technique. COMPARISON:  CT 03/17/2019 FINDINGS: Lower chest: Mild linear opacity lung bases likely scar or atelectasis. No pleural effusion. Calcified nodule right lung base consistent with old granulomatous disease. Hepatobiliary: Diffuse fatty liver infiltration. Gallbladder is nondilated. Pancreas: Unremarkable. No pancreatic ductal dilatation or surrounding inflammatory changes. Spleen: Normal in size without focal abnormality. Adrenals/Urinary Tract: Adrenal glands are unremarkable. Kidneys are normal, without renal calculi, focal lesion, or hydronephrosis. Bladder is unremarkable. Stomach/Bowel: Stomach is within normal limits. Appendix is not well seen. Please correlate with clinical history. No evidence of bowel wall thickening, distention, or inflammatory changes. Vascular/Lymphatic: No significant vascular findings are present. No enlarged abdominal or pelvic lymph nodes. Reproductive: Uterus is present. The left ovary is seen in the upper left hemipelvis and is overall normal in size measuring up to 4.1 by 2.3 cm in the axial plane. There is small cystic area measuring 2.4 cm. No specific imaging follow up in the absence of symptoms Other: No free air or free fluid. Stable linear soft tissue thickening along the anterior abdominal subcutaneous fat. Musculoskeletal:  Scattered degenerative changes. This includes prominent osteophytes along the lower thoracic spine. IMPRESSION: No bowel obstruction, free air or free fluid. Fatty liver infiltration Electronically Signed   By: Karen Kays M.D.   On: 09/09/2022 14:11   US Pelvis Complete  Result Date: 09/09/2022 CLINICAL DATA:  Left lower quadrant pain EXAM: TRANSABDOMINAL AND TRANSVAGINAL ULTRASOUND OF PELVIS DOPPLER ULTRASOUND OF OVARIES TECHNIQUE: Both transabdominal and transvaginal ultrasound examinations of the pelvis were performed. Transabdominal technique was performed for global imaging of the pelvis including uterus, ovaries, adnexal regions, and pelvic cul-de-sac. It was necessary to proceed with endovaginal exam following the transabdominal exam to visualize the adnexa and endometrium. Color and duplex Doppler ultrasound was utilized to evaluate blood flow to the ovaries. COMPARISON:  None Available. FINDINGS: Uterus Measurements: 10.3 x 4.7 x 7.0 cm = volume: 178.7 mL. Small cystic like area along the anterior myometrium towards the right measuring 9 mm.  Possibilities include a cystic fibroid. Endometrium Thickness: 9 mm.  No focal abnormality visualized. Right ovary Measurements: 2.2 x 1.7 x 2.2 cm = volume: 4.24 mL. Normal appearance/no adnexal mass. Best seen transabdominally. Left ovary Not well seen. Evaluation limited by overlapping bowel gas and soft tissue. Pulsed Doppler evaluation of right ovary demonstrates normal low-resistance arterial and venous waveforms. Other findings No abnormal free fluid. IMPRESSION: No significant free fluid.  Small cystic presumed uterine fibroid. Poor visualization of the left ovary. Additional workup as clinically appropriate Electronically Signed   By: Karen Kays M.D.   On: 09/09/2022 14:04   US Transvaginal Non-OB  Result Date: 09/09/2022 CLINICAL DATA:  Left lower quadrant pain EXAM: TRANSABDOMINAL AND TRANSVAGINAL ULTRASOUND OF PELVIS DOPPLER ULTRASOUND OF OVARIES  TECHNIQUE: Both transabdominal and transvaginal ultrasound examinations of the pelvis were performed. Transabdominal technique was performed for global imaging of the pelvis including uterus, ovaries, adnexal regions, and pelvic cul-de-sac. It was necessary to proceed with endovaginal exam following the transabdominal exam to visualize the adnexa and endometrium. Color and duplex Doppler ultrasound was utilized to evaluate blood flow to the ovaries. COMPARISON:  None Available. FINDINGS: Uterus Measurements: 10.3 x 4.7 x 7.0 cm = volume: 178.7 mL. Small cystic like area along the anterior myometrium towards the right measuring 9 mm. Possibilities include a cystic fibroid. Endometrium Thickness: 9 mm.  No focal abnormality visualized. Right ovary Measurements: 2.2 x 1.7 x 2.2 cm = volume: 4.24 mL. Normal appearance/no adnexal mass. Best seen transabdominally. Left ovary Not well seen. Evaluation limited by overlapping bowel gas and soft tissue. Pulsed Doppler evaluation of right ovary demonstrates normal low-resistance arterial and venous waveforms. Other findings No abnormal free fluid. IMPRESSION: No significant free fluid.  Small cystic presumed uterine fibroid. Poor visualization of the left ovary. Additional workup as clinically appropriate Electronically Signed   By: Karen Kays M.D.   On: 09/09/2022 14:04   [place] Art/Ven Flow Abd Pelv Doppler  Result Date: 09/09/2022 CLINICAL DATA:  Left lower quadrant pain EXAM: TRANSABDOMINAL AND TRANSVAGINAL ULTRASOUND OF PELVIS DOPPLER ULTRASOUND OF OVARIES TECHNIQUE: Both transabdominal and transvaginal ultrasound examinations of the pelvis were performed. Transabdominal technique was performed for global imaging of the pelvis including uterus, ovaries, adnexal regions, and pelvic cul-de-sac. It was necessary to proceed with endovaginal exam following the transabdominal exam to visualize the adnexa and endometrium. Color and duplex Doppler ultrasound was utilized to  evaluate blood flow to the ovaries. COMPARISON:  None Available. FINDINGS: Uterus Measurements: 10.3 x 4.7 x 7.0 cm = volume: 178.7 mL. Small cystic like area along the anterior myometrium towards the right measuring 9 mm. Possibilities include a cystic fibroid. Endometrium Thickness: 9 mm.  No focal abnormality visualized. Right ovary Measurements: 2.2 x 1.7 x 2.2 cm = volume: 4.24 mL. Normal appearance/no adnexal mass. Best seen transabdominally. Left ovary Not well seen. Evaluation limited by overlapping bowel gas and soft tissue. Pulsed Doppler evaluation of right ovary demonstrates normal low-resistance arterial and venous waveforms. Other findings No abnormal free fluid. IMPRESSION: No significant free fluid.  Small cystic presumed uterine fibroid. Poor visualization of the left ovary. Additional workup as clinically appropriate Electronically Signed   By: Karen Kays M.D.   On: 09/09/2022 14:04    Procedures Procedures    Medications Ordered in ED Medications  sodium chloride 0.9 % bolus 1,000 mL (0 mLs Intravenous Stopped 09/09/22 1307)  morphine (PF) 4 MG/ML injection 4 mg (4 mg Intravenous Given 09/09/22 1112)  ondansetron (ZOFRAN) injection 4  mg (4 mg Intravenous Given 09/09/22 1112)  morphine (PF) 4 MG/ML injection 4 mg (4 mg Intravenous Given 09/09/22 1215)    ED Course/ Medical Decision Making/ A&P                                 Medical Decision Making Amount and/or Complexity of Data Reviewed Labs: ordered. Radiology: ordered.  Risk Prescription drug management.   This patient presents to the ED for concern of llq abd pain, this involves an extensive number of treatment options, and is a complaint that carries with it a high risk of complications and morbidity.  The differential diagnosis includes kidney stones, uti, ovarian cyst, pregnancy   Co morbidities that complicate the patient evaluation  hiv   Additional history obtained:  Additional history obtained from  epic chart review External records from outside source obtained and reviewed including husband   Lab Tests:  I Ordered, and personally interpreted labs.  The pertinent results include:  cbc nl, cmp nl other than bs elevated at 191, ua nl, wet prep neg, preg neg   Imaging Studies ordered:  I ordered imaging studies including [place], ct  I independently visualized and interpreted imaging which showed  CT renal: No bowel obstruction, free air or free fluid.    Fatty liver infiltration  US pelvis: No significant free fluid.  Small cystic presumed uterine fibroid.    Poor visualization of the left ovary. Additional workup as  clinically appropriate   I agree with the radiologist interpretation   Cardiac Monitoring:  The patient was maintained on a cardiac monitor.  I personally viewed and interpreted the cardiac monitored which showed an underlying rhythm of: nsr   Medicines ordered and prescription drug management:  I ordered medication including ivfs/morphine  for sx  Reevaluation of the patient after these medicines showed that the patient improved I have reviewed the patients home medicines and have made adjustments as needed   Test Considered:  Ct/us   Critical Interventions:  Pain control   Problem List / ED Course:  Abd pain:  likely due to fibroids/ovarian cyst.  Pt is feeling much better.  She is told to f/u with obgyn.  Return if worse. Hyperglycemia:  pt is told this needs to be rechecked by pcp.   Reevaluation:  After the interventions noted above, I reevaluated the patient and found that they have :improved   Social Determinants of Health:  Lives at home; Spanish speaking   Dispostion:  After consideration of the diagnostic results and the patients response to treatment, I feel that the patent would benefit from discharge with outpatient f/u.          Final Clinical Impression(s) / ED Diagnoses Final diagnoses:  Left lower quadrant  abdominal pain  Hyperglycemia  Fibroids    Rx / DC Orders ED Discharge Orders          Ordered    ibuprofen (ADVIL) 600 MG tablet  Every 6 hours PRN        09/09/22 1451    HYDROcodone-acetaminophen (NORCO/VICODIN) 5-325 MG tablet  Every 4 hours PRN        09/09/22 1451              Jacalyn Lefevre, MD 09/09/22 1452

## 2022-09-09 NOTE — ED Triage Notes (Addendum)
Pt presents to the ED with LLQ pain that radiates to upper back that is a constant stabbing pain. She was seen at [facility] and had blood and urine and Xray done but no other imaging. Pain 10/10 with no N/V/D   Translator utilized for this triage  Translator May ID number [phone]

## 2022-09-09 NOTE — Discharge Instructions (Addendum)
Tu nivel de azcar en la sangre estaba un poco elevado aqu.  Deber consultar a su mdico para que le vuelvan a comprobar esto.

## 2022-09-10 ENCOUNTER — Telehealth: Payer: Self-pay

## 2022-09-10 LAB — GC/CHLAMYDIA PROBE AMP (~~LOC~~) NOT AT ARMC
Chlamydia: NEGATIVE
Comment: NEGATIVE
Comment: NORMAL
Neisseria Gonorrhea: NEGATIVE

## 2022-09-10 NOTE — Telephone Encounter (Signed)
[facility] Patient Advocate Encounter  Patient's medication Renaldo Harrison) have been couriered to [facility] from [employer] [facility] and will be administered on the patient next office visit on 09/20/22.  [name] , CPhT Specialty Pharmacy Patient [facility] for [facility] Disease Phone: [phone] Fax:  [phone]

## 2022-09-20 ENCOUNTER — Encounter: Payer: Self-pay | Admitting: Internal Medicine

## 2022-09-20 ENCOUNTER — Ambulatory Visit (INDEPENDENT_AMBULATORY_CARE_PROVIDER_SITE_OTHER): Payer: Medicaid Other | Admitting: Internal Medicine

## 2022-09-20 ENCOUNTER — Other Ambulatory Visit: Payer: Self-pay

## 2022-09-20 VITALS — BP 133/83 | HR 55 | Temp 97.6°F | Ht 60.0 in | Wt 212.0 lb

## 2022-09-20 DIAGNOSIS — B20 Human immunodeficiency virus [HIV] disease: Secondary | ICD-10-CM | POA: Diagnosis not present

## 2022-09-20 DIAGNOSIS — D259 Leiomyoma of uterus, unspecified: Secondary | ICD-10-CM

## 2022-09-20 MED ORDER — CABOTEGRAVIR & RILPIVIRINE ER 600 & 900 MG/3ML IM SUER
1.0000 | Freq: Once | INTRAMUSCULAR | Status: AC
Start: 2022-09-20 — End: 2022-09-20
  Administered 2022-09-20: 1 via INTRAMUSCULAR

## 2022-09-20 NOTE — Patient Instructions (Signed)
Cabenuva today. No need for labs until next visit    Make sure you are able to see gynecology   Follow up with our pharmacy team every 2 months for ongoing cabenuva injection

## 2022-09-20 NOTE — Progress Notes (Signed)
[facility]  Patient Active Problem List   Diagnosis Date Noted   Postpartum care following cesarean delivery 05/30/2020   History of cesarean section 05/02/2020   Transient hypertension of pregnancy in third trimester 04/21/2020   Gestational diabetes 02/19/2020   Language barrier 11/13/2019   HIV (human immunodeficiency virus infection) (HCC) 11/13/2019   History of C-section 11/13/2019      Subjective:    Patient ID: Miranda George, female    DOB: Feb 28, 1980, 42 y.o.   MRN: [id number]  Cc: HIV care  HPI:  Miranda George is a 42 y.o. female here for continued HIV care  09/20/22 id clinic f/u Doing well on cabenuva started a few months ago 08/2022 urine gc/chlam negative 07/2022 hiv rna negative No concern with her health today   Also has lower left abd pain -- 1 month and an ultrasound was done in ED and suggestive of uterine fibroid so pending gynecology    04/09/22 id clinic f/u 2 months lower back back no red flag and worse with activity not waking her up at night No b sx Feels well otherwise Remains in same marriage Baby doing well Compliant with biktarvy We discussed cabenuva and she is interested. She has medicaid She was referred to primary care previously but didn't keep appointment Needs pap/mammogram  09/19/21 id clinic Doing well  No complaint Her husband's hiv status is still a mystery to her -- they don't talk about this  She has no missed dose of biktarvy Her viral load is <20  She has no sx but ok with std testing    05/09/21 id f/u Headache is not as persistent now but still intense. She currently takes 2 times a week. Intensity is 7-8. Frontal/sinus distribution/post-orbital. Some light sensitivity. Episode lasts average 2-3 hours but some times could last the whole day. No focal weakness/numbness Discuss if she misses her biktarvy doses and she said no    04/11/21 id f/u She is here  with language interpreter and her baby (51 months old)  Patient compliant with biktarvy Complains of 1 month of headache. Frontal. 7/10. Tylenol helps. No worsening factor. No visual change/numbness/tingling/weakness. Sometimes dizzi. Some time with chest discomfort. No jaw pain. No hearing disturbance.  Not positional Not worse with straining/coughing Sudden onset No new medication No birth control pill No nasal congestion Some times light sensitivity; no noise sensitivity No fever, chill No smoker No sinus congestion  Eating drinking ok Drink coffee. 1 cup a day in morning for several years. Doesn't make headache worse/better  Patient no hx blood clot No family hx migraine  I reviewed her meds list -- Tylenol prn. biktarvy   9/27 id f/u No longer on metformin Takes biktarvy Virologically and immunologically well controlled No missed biktarvy dose last 4 weeks Baby boy is 5 months now, lives with mom (and husband, and older son). No issue. Not breast feeding. Seronegative No f/c, headache, rash, joint pain, vaginal discharge/pelvic pain Reports upper epigastric pain radiate to both sides -- since taking biktarvy Some heart burn No pcp yet -- trying to get internal medicine clinic here as pcp.  Doesn't take acid blocker Milk gives her some relief Denies taking nsaids  Last well woman exam/pap during pregnancy. She states she wants to do it here  Husband told her he tested twice and negative. Patient without other sexual exposure   06/20/20 id f/u Patient delivered healthy child 05/2020 Her hiv  viral load has been negative several months, on ART, prior to delivery She continues to be compliant with ART with no missed dose Reviewed labs with her Her lft is a little elevated on 06/06/2020; previously exposed to hep a; 10/2019 hep b and c serology negative. She had no issues with peripartum hypertension or HELLP I reviewed her immunization record; only one heplisav dose  12/2019. She doesn't recall getting the second dose yet She is without n/v/diarrhea/abd pain She does feel dizzy since delivery of her child; feels unsteady at times as well but no dyspnea/fatigue. She endorses turning head in any direction makes it worse.  She has good appetite She doesn't breast feed because of the hiv  She is here with her newborn boy of 1 month. His prelim hiv test is negative; he is not on any hiv pill; he'll have repeat hiv testing again soon  Social review --  Lives with her husband Husband: hasn't repeated testing yet -- worried about not being able to pay Substance use: no smoking, drinking, drugs Work: housewife  03/07/20 id f/u Doing well Good prenatal care Gestational diabetes -- advised diet/exercise; no oral or insulin yet No f/c No vaginal bleeding/discharge No rash Husband still not getting hiv testing repeat/care yet; patient tried to call [facility] but no information yet Compliant with truvada/tivicay Reviewed labs; hiv undetectable   background -------------  She was seen in the ED on 12/03 for abd pain and vaginal bleeding, briefly admitted and dx'ed with subchorionic hematoma. Gc/chlamydia, wet mount negative. The vaginal bleeding had stopped yesterday, and the abdominal pain had also gone  #HIV Dx'ed 10/2019; heterosexual; denies ivdu/promiscuity; patient is married Patient had some blood transfusion during her 2nd pregnancy Husband tested and told her he was negative 2 months ago although there is some question as he tried to call back to the clinic and haven't heard Patient married 1 year; together for 4 years She was seen in [facility] on 10/28 with our pharmacist Cassie and started on tivicay and truvada. She had some nausea at the beginning but no longer. She doesn't miss any dose. 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, v179I/V, R211K PR mutation -- I62v, L63p, v77I/V, I93L There were no  phenotypic resistance to NRTI, nnrti, or PI Baseline HIV labs: [id number] negative 11/12/2019 hepatitis b sAb, sAg, cAb; hep cAb negative; hep A Ab positive 11/12/2019 quant gold negative  She has fatigue since pregnancy and that hasn't changed She also endorsed some mild depression but no hi/si (because of positive hiv)  No fever, chill, nightsweat No headache, visual change, blurriness No cough, chest pain, sob No sore throat No diarrhea No urinary urgency, dysuria, flank pain No bruising No joint pain, myalgia, muscle weakness No focal weakness No rash   No Known Allergies    Outpatient Medications Prior to Visit  Medication Sig Dispense Refill   ACCU-CHEK GUIDE test strip USE TO CHECK BLOOD SUGAR 4 TIMES DAILY     Accu-Chek Softclix Lancets lancets 4 (four) times daily.     acetaminophen (TYLENOL) 500 MG tablet Take 2 tablets (1,000 mg total) by mouth every 8 (eight) hours. 30 tablet 0   cabotegravir & rilpivirine ER (CABENUVA) 600 & 900 MG/3ML injection Inject 1 kit into the muscle every 2 (two) months. 6 mL 5   cabotegravir & rilpivirine ER (CABENUVA) 600 & 900 MG/3ML injection Inject 1 kit into the muscle every 30 (thirty) days. 6  mL 1   HYDROcodone-acetaminophen (NORCO/VICODIN) 5-325 MG tablet Take 1 tablet by mouth every 4 (four) hours as needed. 10 tablet 0   ibuprofen (ADVIL) 600 MG tablet Take 1 tablet (600 mg total) by mouth every 6 (six) hours as needed. 30 tablet 0   simethicone (MYLICON) 80 MG chewable tablet Chew 1 tablet (80 mg total) by mouth 3 (three) times daily after meals. (Patient not taking: Reported on 04/11/2021) 30 tablet 0   No facility-administered medications prior to visit.     Past Medical History:  Diagnosis Date   Anemia    Blood transfusion without reported diagnosis    Cholestasis during pregnancy    Fibroid    GERD (gastroesophageal reflux disease)    Gestational diabetes    HIV (human immunodeficiency virus infection) (HCC)        Past Surgical History:  Procedure Laterality Date   APPENDECTOMY     18 years ago   APPENDECTOMY     CESAREAN SECTION  2012   CESAREAN SECTION     CESAREAN SECTION N/A 05/02/2020   Procedure: CESAREAN SECTION;  Surgeon: Kathrynn Running, MD;  Location: [facility] LD ORS;  Service: Obstetrics;  Laterality: N/A;      Family History  Problem Relation Age of Onset   Hypertension Mother    Diabetes Mother    Thyroid disease Mother    Hypertension Father    Diabetes Father       Social History   Socioeconomic History   Marital status: Married    Spouse name: Not on file   Number of children: Not on file   Years of education: Not on file   Highest education level: Not on file  Occupational History   Not on file  Tobacco Use   Smoking status: Never   Smokeless tobacco: Never  Vaping Use   Vaping status: Never Used  Substance and Sexual Activity   Alcohol use: Never    Comment: occasionally   Drug use: Never   Sexual activity: Yes    Birth control/protection: None    Comment: declined condoms  Other Topics Concern   Not on file  Social History Narrative   ** Merged History Encounter **       Social Determinants of Health   Financial Resource Strain: Not on file  Food Insecurity: Food Insecurity Present (05/30/2020)   Hunger Vital Sign    Worried About Running Out of Food in the Last Year: Sometimes true    Ran Out of Food in the Last Year: Sometimes true  Transportation Needs: No Transportation Needs (05/30/2020)   PRAPARE - Administrator, Civil Service (Medical): No    Lack of Transportation (Non-Medical): No  Physical Activity: Not on file  Stress: Not on file  Social Connections: Not on file  Intimate Partner Violence: Not on file           Objective:    BP 133/83   Pulse (!) 55   Temp 97.6 F (36.4 C) (Temporal)   Ht 5' (1.524 m)   Wt 212 lb (96.2 kg)   LMP 09/02/2022 (Approximate)   SpO2 98%   BMI 41.40 kg/m  Nursing note  and vital signs reviewed.  Physical Exam General/constitutional: no distress, pleasant HEENT: Normocephalic, PER, Conj Clear, EOMI, Oropharynx clear Neck supple CV: rrr no mrg Lungs: clear to auscultation, normal respiratory effort Abd: Soft, Nontender Ext: no edema Skin: No Rash Neuro: nonfocal MSK: no peripheral joint swelling/tenderness/warmth; back  spines nontender         Labs: Reviewed Lab Results  Component Value Date   WBC 6.2 09/09/2022   HGB 12.7 09/09/2022   HCT 39.1 09/09/2022   MCV 86.7 09/09/2022   PLT 231 09/09/2022   Last metabolic panel Lab Results  Component Value Date   GLUCOSE 191 (H) 09/09/2022   NA 136 09/09/2022   K 4.0 09/09/2022   CL 102 09/09/2022   CO2 24 09/09/2022   BUN 9 09/09/2022   CREATININE 0.76 09/09/2022   GFRNONAA >60 09/09/2022   GFRAA 117 06/20/2020   CALCIUM 8.9 09/09/2022   PROT 6.8 09/09/2022   ALBUMIN 3.6 09/09/2022   LABGLOB 2.9 04/21/2020   AGRATIO 1.2 04/21/2020   BILITOT 0.9 09/09/2022   ALKPHOS 61 09/09/2022   AST 43 (H) 09/09/2022   ALT 58 (H) 09/09/2022   ANIONGAP 10 09/09/2022    Hiv                cd4 (%)      /    hiv vl 07/2022                           /     <20 08/2021           818 (46)   /     <20 04/11/21                           /    41 10/11/20                           /    ud 02/14                                /    <20 12/28               340 (32)    /     32k  Serology: 08/2022 urine gc/chlam negative 05/2022 rpr negative 03/2021 rpr nonreactive 12/18/19 urine gc/chlam and wetmount negative 10/28 quant gold negative; hep b/c negative; hep a reactive; rpr nonreactive 10/28 hla [id number] negative 10/28 hiv genotype no phenotypic resistance    Imaging: 04/22/21 brain mri FINDINGS: Brain: No acute infarct, mass effect or extra-axial collection. No acute or chronic hemorrhage. Normal white matter signal, parenchymal volume and CSF spaces. The midline structures are normal.   Vascular:  Major flow voids are preserved.   Skull and upper cervical spine: Normal calvarium and skull base. Visualized upper cervical spine and soft tissues are normal.   Sinuses/Orbits:No paranasal sinus fluid levels or advanced mucosal thickening. No mastoid or middle ear effusion. Normal orbits.   IMPRESSION: Normal brain MRI.   Assessment & Plan:   Patient Active Problem List   Diagnosis Date Noted   Postpartum care following cesarean delivery 05/30/2020   History of cesarean section 05/02/2020   Transient hypertension of pregnancy in third trimester 04/21/2020   Gestational diabetes 02/19/2020   Language barrier 11/13/2019   HIV (human immunodeficiency virus infection) (HCC) 11/13/2019   History of C-section 11/13/2019     Problem List Items Addressed This Visit   None #hiv dx'ed during pregnancy. Suspect transmission from husband. No other risk factors As of this visit she is not sure of her husband's hiv status.  I have advised her to have an open discussion with her husband to get retested. 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 No evidence IRIS. Baseline cd4 prior to med 340 (32%)  Lab Results  Component Value Date   HIV1RNAQUANT <20 (H) 07/31/2022   Lab Results  Component Value Date   CD4TCELL 46 08/29/2021   CD4TABS 818 08/29/2021   Tolerating/compliant with biktarvy Doing well on biktarvy  04/09/22 discussed cabenuva and she is interested --> doing well on it since and continue to be virologically controlled    -discussed u=u -encourage compliance -continue current HIV medication -labs next year -f/u in 1 year with me and every 2 months with pharmacy for cabenuva injection -cabenuva today   #lower abd pain Ed visit 08/2023 ultrasound suggestive uterine fibroid Sent to gynecology by pcp recently for uterine  fibroid/adenomyosis w/u -- awaiting appointment   #hcm -vaccination prevnar 11/12/19 Pneumovax 06/2020 tdap 01/2020 covid moderna 2 shots by 07/2019 Meningitis booster 07/2022 -hepatitis heplisav started 12/2019 finished 06/2020 -- repeat hep b sAb 09/2020 213 Will resend hepatitis b sAb quant next visit to determine durability of heplisav -std screening 08/2022 urine gc/chlam negative; 05/2022 rpr negative -annual 10/2019 quant gold negative 06/2020 rpr negative -woman's health Advise pcp establishment for other primary care issue such as breast cancer screening     Follow-up: Return in about 1 year (around 09/20/2023).     Raymondo Band, MD [facility] -- -- pager   [phone] cell 09/20/2022, 11:06 AM

## 2022-09-20 NOTE — Addendum Note (Signed)
Addended by: Philippa [place] on: 09/20/2022 12:58 PM   Modules accepted: Orders

## 2022-10-11 ENCOUNTER — Ambulatory Visit: Payer: Medicaid Other | Admitting: Internal Medicine

## 2022-10-15 DIAGNOSIS — R102 Pelvic and perineal pain: Secondary | ICD-10-CM | POA: Diagnosis not present

## 2022-10-23 ENCOUNTER — Other Ambulatory Visit (HOSPITAL_COMMUNITY): Payer: Self-pay

## 2022-10-23 ENCOUNTER — Other Ambulatory Visit: Payer: Self-pay

## 2022-10-23 NOTE — Progress Notes (Signed)
Specialty Pharmacy Refill Coordination Note  Domino Ledell Noss is a 42 y.o. female assessed today regarding refills of clinic administered specialty medication(s) Cabotegravir & Rilpivirine   Clinic requested Courier to Provider Office   Delivery date: 11/09/22   Verified address: [facility] [address] [place] [address]   Medication will be filled on 11/08/22.

## 2022-11-05 DIAGNOSIS — Z124 Encounter for screening for malignant neoplasm of cervix: Secondary | ICD-10-CM | POA: Diagnosis not present

## 2022-11-05 DIAGNOSIS — R102 Pelvic and perineal pain: Secondary | ICD-10-CM | POA: Diagnosis not present

## 2022-11-08 ENCOUNTER — Other Ambulatory Visit (HOSPITAL_COMMUNITY): Payer: Self-pay

## 2022-11-09 ENCOUNTER — Telehealth: Payer: Self-pay

## 2022-11-09 NOTE — Telephone Encounter (Signed)
[facility] Patient Advocate Encounter  Patient's medications CABENUVA have been couriered to [facility] from [facility] and will be administered at the patients appointment on 11/19/22.  Kae Heller, CPhT Specialty Pharmacy Patient [facility] for Infectious Disease Phone: [phone] Fax:  [phone]

## 2022-11-19 ENCOUNTER — Ambulatory Visit: Payer: Medicaid Other | Admitting: Pharmacist

## 2022-11-19 NOTE — Progress Notes (Unsigned)
HPI: Miranda George is a 42 y.o. female who presents to the [facility] pharmacy clinic for [place] administration.  Patient Active Problem List   Diagnosis Date Noted   Postpartum care following cesarean delivery 05/30/2020   History of cesarean section 05/02/2020   Transient hypertension of pregnancy in third trimester 04/21/2020   Gestational diabetes 02/19/2020   Language barrier 11/13/2019   HIV (human immunodeficiency virus infection) (HCC) 11/13/2019   History of C-section 11/13/2019    Patient's Medications  New Prescriptions   No medications on file  Previous Medications   ACCU-CHEK GUIDE TEST STRIP    USE TO CHECK BLOOD SUGAR 4 TIMES DAILY   ACCU-CHEK SOFTCLIX LANCETS LANCETS    4 (four) times daily.   ACETAMINOPHEN (TYLENOL) 500 MG TABLET    Take 2 tablets (1,000 mg total) by mouth every 8 (eight) hours.   CABOTEGRAVIR & RILPIVIRINE ER (CABENUVA) 600 & 900 MG/3ML INJECTION    Inject 1 kit into the muscle every 2 (two) months.   CABOTEGRAVIR & RILPIVIRINE ER (CABENUVA) 600 & 900 MG/3ML INJECTION    Inject 1 kit into the muscle every 30 (thirty) days.   HYDROCODONE-ACETAMINOPHEN (NORCO/VICODIN) 5-325 MG TABLET    Take 1 tablet by mouth every 4 (four) hours as needed.   IBUPROFEN (ADVIL) 600 MG TABLET    Take 1 tablet (600 mg total) by mouth every 6 (six) hours as needed.   SIMETHICONE (MYLICON) 80 MG CHEWABLE TABLET    Chew 1 tablet (80 mg total) by mouth 3 (three) times daily after meals.  Modified Medications   No medications on file  Discontinued Medications   No medications on file    Allergies: No Known Allergies  Labs: Lab Results  Component Value Date   HIV1RNAQUANT <20 (H) 07/31/2022   HIV1RNAQUANT <20 (H) 05/24/2022   HIV1RNAQUANT Not Detected 04/09/2022   CD4TABS 818 08/29/2021   CD4TABS 776 04/11/2021   CD4TABS 527 06/06/2020    RPR and STI Lab Results  Component Value Date   LABRPR NON-REACTIVE 05/24/2022   LABRPR NON-REACTIVE 04/24/2022    LABRPR NON-REACTIVE 04/09/2022   LABRPR NON-REACTIVE 09/19/2021   LABRPR NON-REACTIVE 04/11/2021    STI Results GC CT  09/09/2022 11:00 AM Negative  Negative   05/24/2022 10:16 AM Negative  Negative   05/24/2022  9:45 AM Negative  Negative   04/24/2022 10:09 AM Negative  Negative   04/09/2022 11:26 AM Negative  Negative   09/19/2021 10:55 AM Negative  Negative   03/18/2020  6:56 PM Negative  Negative   12/18/2019  8:07 PM Negative  Negative   11/12/2019 10:54 AM Negative  Negative   11/05/2019 12:00 AM  Negative      03/17/2019 12:00 AM Negative  Negative      This result is from an external source.    Hepatitis B Lab Results  Component Value Date   HEPBSAB NON-REACTIVE 11/12/2019   HEPBSAG NON-REACTIVE 06/20/2020   HEPBCAB NON-REACTIVE 06/20/2020   Hepatitis C Lab Results  Component Value Date   HEPCAB NON-REACTIVE 06/20/2020   Hepatitis A Lab Results  Component Value Date   HAV REACTIVE (A) 11/12/2019   Lipids: Lab Results  Component Value Date   CHOL 154 11/12/2019   TRIG 198 (H) 11/12/2019   HDL 51 11/12/2019   CHOLHDL 3.0 11/12/2019   LDLCALC 73 11/12/2019    TARGET DATE: The 9th of the month  Assessment: Miranda George presents today for her maintenance Cabenuva injections. Past injections were tolerated  well without issues. Last visit with Dr. Renold Don was 09/20/22, with 1-year follow up. Routine labs at that visit.  Administered cabotegravir 600mg /1mL in left upper outer quadrant of the gluteal muscle. Administered rilpivirine 900 mg/24mL in the right upper outer quadrant of the gluteal muscle. No issues with injections. Miranda George will follow up in 2 months for next set of injections.  Immunizations: Due for Menveo 3/3, COVID, influenza. Needs recheck for Hepatitis B immunity. Will plan for 3rd dose HPV at next pharmacy visit.   Plan: - Cabenuva injections administered - Menveo 3/3 given in clinic today - Hepatitis B antibody today - Next injections scheduled  for *** with Cassie/Amanda - Call with any issues or questions  Lora Paula, PharmD PGY-2 Infectious Diseases Pharmacy Resident 11/19/2022 10:28 AM

## 2022-11-20 ENCOUNTER — Other Ambulatory Visit: Payer: Self-pay

## 2022-11-20 ENCOUNTER — Ambulatory Visit: Payer: Medicaid Other | Admitting: Pharmacist

## 2022-11-20 DIAGNOSIS — Z23 Encounter for immunization: Secondary | ICD-10-CM | POA: Diagnosis present

## 2022-11-20 DIAGNOSIS — B20 Human immunodeficiency virus [HIV] disease: Secondary | ICD-10-CM

## 2022-11-20 MED ORDER — CABOTEGRAVIR & RILPIVIRINE ER 600 & 900 MG/3ML IM SUER
1.0000 | Freq: Once | INTRAMUSCULAR | Status: AC
Start: 2022-11-20 — End: 2022-11-20
  Administered 2022-11-20: 1 via INTRAMUSCULAR

## 2022-11-22 ENCOUNTER — Other Ambulatory Visit: Payer: Self-pay

## 2022-11-22 LAB — HIV-1 RNA QUANT-NO REFLEX-BLD
HIV 1 RNA Quant: NOT DETECTED {copies}/mL
HIV-1 RNA Quant, Log: NOT DETECTED {Log_copies}/mL

## 2022-11-22 LAB — HEPATITIS B SURFACE ANTIBODY,QUALITATIVE: Hep B S Ab: NONREACTIVE

## 2022-12-03 DIAGNOSIS — M7918 Myalgia, other site: Secondary | ICD-10-CM | POA: Diagnosis not present

## 2022-12-03 DIAGNOSIS — M545 Low back pain, unspecified: Secondary | ICD-10-CM | POA: Diagnosis not present

## 2022-12-03 DIAGNOSIS — G8929 Other chronic pain: Secondary | ICD-10-CM | POA: Diagnosis not present

## 2022-12-03 DIAGNOSIS — N946 Dysmenorrhea, unspecified: Secondary | ICD-10-CM | POA: Diagnosis not present

## 2022-12-03 DIAGNOSIS — R102 Pelvic and perineal pain: Secondary | ICD-10-CM | POA: Diagnosis not present

## 2022-12-03 DIAGNOSIS — N920 Excessive and frequent menstruation with regular cycle: Secondary | ICD-10-CM | POA: Diagnosis not present

## 2022-12-18 DIAGNOSIS — R87610 Atypical squamous cells of undetermined significance on cytologic smear of cervix (ASC-US): Secondary | ICD-10-CM | POA: Diagnosis not present

## 2022-12-18 DIAGNOSIS — N879 Dysplasia of cervix uteri, unspecified: Secondary | ICD-10-CM | POA: Diagnosis not present

## 2022-12-18 DIAGNOSIS — Z21 Asymptomatic human immunodeficiency virus [HIV] infection status: Secondary | ICD-10-CM | POA: Diagnosis not present

## 2022-12-18 DIAGNOSIS — N72 Inflammatory disease of cervix uteri: Secondary | ICD-10-CM | POA: Diagnosis not present

## 2022-12-21 ENCOUNTER — Other Ambulatory Visit (HOSPITAL_COMMUNITY): Payer: Self-pay

## 2022-12-25 ENCOUNTER — Other Ambulatory Visit (HOSPITAL_COMMUNITY): Payer: Self-pay

## 2022-12-25 ENCOUNTER — Ambulatory Visit: Payer: Medicaid Other | Admitting: Internal Medicine

## 2023-01-02 ENCOUNTER — Other Ambulatory Visit (HOSPITAL_COMMUNITY): Payer: Self-pay

## 2023-01-02 ENCOUNTER — Other Ambulatory Visit: Payer: Self-pay

## 2023-01-02 NOTE — Progress Notes (Signed)
Specialty Pharmacy Refill Coordination Note  Vielka Ledell Noss is a 42 y.o. female assessed today regarding refills of clinic administered specialty medication(s) Cabotegravir & Rilpivirine [facility])   Clinic requested Courier to Provider Office   Delivery date: 01/17/23   Verified address: [address] [place] [address]   Medication will be filled on 01/15/23.

## 2023-01-15 ENCOUNTER — Other Ambulatory Visit: Payer: Self-pay

## 2023-01-17 ENCOUNTER — Telehealth: Payer: Self-pay

## 2023-01-17 NOTE — Telephone Encounter (Signed)
 [facility] Patient Advocate Encounter  Patient's medications CABENUVA  have been couriered to [facility] from [facility] and will be administered at the patients appointment on 01/23/23.  Charmaine Sharps, CPhT Specialty Pharmacy Patient [facility] for Infectious Disease Phone: [phone] Fax:  [phone]

## 2023-01-23 ENCOUNTER — Ambulatory Visit: Payer: Medicaid Other | Admitting: Pharmacist

## 2023-01-23 NOTE — Progress Notes (Signed)
 HPI: Miranda George is a 43 y.o. female who presents to the [facility] pharmacy clinic for Cabenuva  administration.  Patient Active Problem List   Diagnosis Date Noted   Postpartum care following cesarean delivery 05/30/2020   History of cesarean section 05/02/2020   Transient hypertension of pregnancy in third trimester 04/21/2020   Gestational diabetes 02/19/2020   Language barrier 11/13/2019   HIV (human immunodeficiency virus infection) (HCC) 11/13/2019   History of C-section 11/13/2019    Patient's Medications  New Prescriptions   No medications on file  Previous Medications   ACCU-CHEK GUIDE TEST STRIP    USE TO CHECK BLOOD SUGAR 4 TIMES DAILY   ACCU-CHEK SOFTCLIX LANCETS LANCETS    4 (four) times daily.   ACETAMINOPHEN  (TYLENOL ) 500 MG TABLET    Take 2 tablets (1,000 mg total) by mouth every 8 (eight) hours.   CABOTEGRAVIR  & RILPIVIRINE  ER (CABENUVA ) 600 & 900 MG/3ML INJECTION    Inject 1 kit into the muscle every 2 (two) months.   CABOTEGRAVIR  & RILPIVIRINE  ER (CABENUVA ) 600 & 900 MG/3ML INJECTION    Inject 1 kit into the muscle every 30 (thirty) days.   HYDROCODONE -ACETAMINOPHEN  (NORCO/VICODIN) 5-325 MG TABLET    Take 1 tablet by mouth every 4 (four) hours as needed.   IBUPROFEN  (ADVIL ) 600 MG TABLET    Take 1 tablet (600 mg total) by mouth every 6 (six) hours as needed.   SIMETHICONE  (MYLICON) 80 MG CHEWABLE TABLET    Chew 1 tablet (80 mg total) by mouth 3 (three) times daily after meals.  Modified Medications   No medications on file  Discontinued Medications   No medications on file    Allergies: No Known Allergies  Labs: Lab Results  Component Value Date   HIV1RNAQUANT Not Detected 11/20/2022   HIV1RNAQUANT <20 (H) 07/31/2022   HIV1RNAQUANT <20 (H) 05/24/2022   CD4TABS 818 08/29/2021   CD4TABS 776 04/11/2021   CD4TABS 527 06/06/2020    RPR and STI Lab Results  Component Value Date   LABRPR NON-REACTIVE 05/24/2022   LABRPR NON-REACTIVE 04/24/2022    LABRPR NON-REACTIVE 04/09/2022   LABRPR NON-REACTIVE 09/19/2021   LABRPR NON-REACTIVE 04/11/2021    STI Results GC CT  09/09/2022 11:00 AM Negative  Negative   05/24/2022 10:16 AM Negative  Negative   05/24/2022  9:45 AM Negative  Negative   04/24/2022 10:09 AM Negative  Negative   04/09/2022 11:26 AM Negative  Negative   09/19/2021 10:55 AM Negative  Negative   03/18/2020  6:56 PM Negative  Negative   12/18/2019  8:07 PM Negative  Negative   11/12/2019 10:54 AM Negative  Negative   11/05/2019 12:00 AM  Negative      03/17/2019 12:00 AM Negative  Negative      This result is from an external source.    Hepatitis B Lab Results  Component Value Date   HEPBSAB NON-REACTIVE 11/20/2022   HEPBSAG NON-REACTIVE 06/20/2020   HEPBCAB NON-REACTIVE 06/20/2020   Hepatitis C Lab Results  Component Value Date   HEPCAB NON-REACTIVE 06/20/2020   Hepatitis A Lab Results  Component Value Date   HAV REACTIVE (A) 11/12/2019   Lipids: Lab Results  Component Value Date   CHOL 154 11/12/2019   TRIG 198 (H) 11/12/2019   HDL 51 11/12/2019   CHOLHDL 3.0 11/12/2019   LDLCALC 73 11/12/2019    TARGET DATE: The 9th  Assessment: Miranda George presents today for her maintenance Cabenuva  injections. Past injections were tolerated well without issues  except for mild pain the day after receiving them. Advised her to take Tylenol  as needed, use a warm compress/washcloth, and to move around after receiving. Last HIV RNA was undetectable in November. Started in April 2024 so will defer HIV RNA check today. Up to date on all vaccines.  Of note, she is due for a 6 month follow up with Dr. Overton in March. He was not available during her injection window for March or May so made an additional appointment for April. She was fine with this. He last saw patient in September.  Administered cabotegravir  600mg /21mL in left upper outer quadrant of the gluteal muscle. Administered rilpivirine  900 mg/3mL in the right  upper outer quadrant of the gluteal muscle. No issues with injections. She will follow up in 2 months for next set of injections.  Plan: - Cabenuva  injections administered - Next injections scheduled for 03/20/23 with me; B20 follow up scheduled with Dr. Overton on 04/24/23 - Call with any issues or questions  Edia Pursifull L. Kaid Seeberger, PharmD, BCIDP, AAHIVP, CPP Clinical Pharmacist Practitioner Infectious Diseases Clinical Pharmacist [facility]

## 2023-01-28 ENCOUNTER — Ambulatory Visit: Payer: Medicaid Other | Admitting: Pharmacist

## 2023-01-28 ENCOUNTER — Other Ambulatory Visit: Payer: Self-pay

## 2023-01-28 DIAGNOSIS — B20 Human immunodeficiency virus [HIV] disease: Secondary | ICD-10-CM | POA: Diagnosis present

## 2023-01-28 MED ORDER — CABOTEGRAVIR & RILPIVIRINE ER 600 & 900 MG/3ML IM SUER
1.0000 | Freq: Once | INTRAMUSCULAR | Status: AC
Start: 2023-01-28 — End: 2023-01-28
  Administered 2023-01-28: 1 via INTRAMUSCULAR

## 2023-03-04 ENCOUNTER — Other Ambulatory Visit (HOSPITAL_COMMUNITY): Payer: Self-pay

## 2023-03-04 ENCOUNTER — Other Ambulatory Visit: Payer: Self-pay

## 2023-03-04 NOTE — Progress Notes (Signed)
Specialty Pharmacy Refill Coordination Note  Miranda George is a 43 y.o. female assessed today regarding refills of clinic administered specialty medication(s) Cabotegravir & Rilpivirine [facility])   Clinic requested Courier to Provider Office   Delivery date: 03/13/23   Verified address: [address] [place] [address]   Medication will be filled on 03/12/23.

## 2023-03-06 DIAGNOSIS — R102 Pelvic and perineal pain: Secondary | ICD-10-CM | POA: Diagnosis not present

## 2023-03-06 DIAGNOSIS — N920 Excessive and frequent menstruation with regular cycle: Secondary | ICD-10-CM | POA: Diagnosis not present

## 2023-03-06 DIAGNOSIS — N946 Dysmenorrhea, unspecified: Secondary | ICD-10-CM | POA: Diagnosis not present

## 2023-03-12 ENCOUNTER — Other Ambulatory Visit: Payer: Self-pay

## 2023-03-13 ENCOUNTER — Telehealth: Payer: Self-pay

## 2023-03-13 NOTE — Telephone Encounter (Signed)
 [facility] Patient Advocate Encounter  Patient's medications CABENUVA have been couriered to [facility] from [facility] and will be administered at the patients appointment on 03/20/23.  Kae Heller, CPhT Specialty Pharmacy Patient [facility] for [facility] Phone: [phone] Fax:  [phone]

## 2023-03-19 NOTE — Progress Notes (Deleted)
 HPI: Miranda George is a 43 y.o. female who presents to the [facility] pharmacy clinic for [place] administration.  Patient Active Problem List   Diagnosis Date Noted   Postpartum care following cesarean delivery 05/30/2020   History of cesarean section 05/02/2020   Transient hypertension of pregnancy in third trimester 04/21/2020   Gestational diabetes 02/19/2020   Language barrier 11/13/2019   HIV (human immunodeficiency virus infection) (HCC) 11/13/2019   History of C-section 11/13/2019    Patient's Medications  New Prescriptions   No medications on file  Previous Medications   ACCU-CHEK GUIDE TEST STRIP    USE TO CHECK BLOOD SUGAR 4 TIMES DAILY   ACCU-CHEK SOFTCLIX LANCETS LANCETS    4 (four) times daily.   ACETAMINOPHEN (TYLENOL) 500 MG TABLET    Take 2 tablets (1,000 mg total) by mouth every 8 (eight) hours.   CABOTEGRAVIR & RILPIVIRINE ER (CABENUVA) 600 & 900 MG/3ML INJECTION    Inject 1 kit into the muscle every 2 (two) months.   CABOTEGRAVIR & RILPIVIRINE ER (CABENUVA) 600 & 900 MG/3ML INJECTION    Inject 1 kit into the muscle every 30 (thirty) days.   HYDROCODONE-ACETAMINOPHEN (NORCO/VICODIN) 5-325 MG TABLET    Take 1 tablet by mouth every 4 (four) hours as needed.   IBUPROFEN (ADVIL) 600 MG TABLET    Take 1 tablet (600 mg total) by mouth every 6 (six) hours as needed.   SIMETHICONE (MYLICON) 80 MG CHEWABLE TABLET    Chew 1 tablet (80 mg total) by mouth 3 (three) times daily after meals.  Modified Medications   No medications on file  Discontinued Medications   No medications on file    Allergies: No Known Allergies  Labs: Lab Results  Component Value Date   HIV1RNAQUANT Not Detected 11/20/2022   HIV1RNAQUANT <20 (H) 07/31/2022   HIV1RNAQUANT <20 (H) 05/24/2022   CD4TABS 818 08/29/2021   CD4TABS 776 04/11/2021   CD4TABS 527 06/06/2020    RPR and STI Lab Results  Component Value Date   LABRPR NON-REACTIVE 05/24/2022   LABRPR NON-REACTIVE 04/24/2022    LABRPR NON-REACTIVE 04/09/2022   LABRPR NON-REACTIVE 09/19/2021   LABRPR NON-REACTIVE 04/11/2021    STI Results GC CT  09/09/2022 11:00 AM Negative  Negative   05/24/2022 10:16 AM Negative  Negative   05/24/2022  9:45 AM Negative  Negative   04/24/2022 10:09 AM Negative  Negative   04/09/2022 11:26 AM Negative  Negative   09/19/2021 10:55 AM Negative  Negative   03/18/2020  6:56 PM Negative  Negative   12/18/2019  8:07 PM Negative  Negative   11/12/2019 10:54 AM Negative  Negative   11/05/2019 12:00 AM  Negative      03/17/2019 12:00 AM Negative  Negative      This result is from an external source.    Hepatitis B Lab Results  Component Value Date   HEPBSAB NON-REACTIVE 11/20/2022   HEPBSAG NON-REACTIVE 06/20/2020   HEPBCAB NON-REACTIVE 06/20/2020   Hepatitis C Lab Results  Component Value Date   HEPCAB NON-REACTIVE 06/20/2020   Hepatitis A Lab Results  Component Value Date   HAV REACTIVE (A) 11/12/2019   Lipids: Lab Results  Component Value Date   CHOL 154 11/12/2019   TRIG 198 (H) 11/12/2019   HDL 51 11/12/2019   CHOLHDL 3.0 11/12/2019   LDLCALC 73 11/12/2019    TARGET DATE: 9th  Assessment: Miranda George presents today for her maintenance Cabenuva injections. Past injections were tolerated well without issues. Last  HIV RNA was undetectable in November; next check due in May.   Administered cabotegravir 600mg /71mL in left upper outer quadrant of the gluteal muscle. Administered rilpivirine 900 mg/63mL in the right upper outer quadrant of the gluteal muscle. No issues with injections. She will follow up in 2 months for next set of injections.  RESCHEDULE VU APPT  Plan: - Cabenuva injections administered - Next injections scheduled for 04/24/23 with Dr. Renold Don and *** with me - Call with any issues or questions  Ridwan Bondy L. Dillyn Menna, PharmD, BCIDP, AAHIVP, CPP Clinical Pharmacist Practitioner Infectious Diseases Clinical Pharmacist [facility]

## 2023-03-20 ENCOUNTER — Ambulatory Visit: Payer: Medicaid Other | Admitting: Pharmacist

## 2023-04-17 ENCOUNTER — Other Ambulatory Visit (HOSPITAL_COMMUNITY): Payer: Self-pay

## 2023-04-17 ENCOUNTER — Other Ambulatory Visit: Payer: Self-pay

## 2023-04-17 NOTE — Progress Notes (Signed)
 Specialty Pharmacy Refill Coordination Note  Miranda George is a 43 y.o. female assessed today regarding refills of clinic administered specialty medication(s) Cabotegravir & Rilpivirine [facility])   Clinic requested Courier to Provider Office   Delivery date: 04/19/23   Verified address: [address] [place] [address]   Medication will be filled on 04/18/23.

## 2023-04-18 ENCOUNTER — Other Ambulatory Visit: Payer: Self-pay

## 2023-04-19 ENCOUNTER — Telehealth: Payer: Self-pay

## 2023-04-19 NOTE — Telephone Encounter (Signed)
 [facility] Patient Advocate Encounter  Patient's medications Miranda George have been couriered to [facility] from [facility] and will be administered at the patients appointment on 04/24/23.  [name], CPhT Specialty Pharmacy Patient [facility] for Infectious Disease Phone: [phone] Fax:  [phone]

## 2023-04-24 ENCOUNTER — Ambulatory Visit: Payer: Medicaid Other | Admitting: Internal Medicine

## 2023-05-03 DIAGNOSIS — K649 Unspecified hemorrhoids: Secondary | ICD-10-CM | POA: Diagnosis not present

## 2023-05-16 ENCOUNTER — Ambulatory Visit: Admitting: Internal Medicine

## 2023-05-17 NOTE — Progress Notes (Unsigned)
 HPI: Miranda George is a 43 y.o. female who presents to the [facility] pharmacy clinic for HIV follow-up.  Patient Active Problem List   Diagnosis Date Noted   Postpartum care following cesarean delivery 05/30/2020   History of cesarean section 05/02/2020   Transient hypertension of pregnancy in third trimester 04/21/2020   Gestational diabetes 02/19/2020   Language barrier 11/13/2019   HIV (human immunodeficiency virus infection) (HCC) 11/13/2019   History of C-section 11/13/2019    Patient's Medications  New Prescriptions   No medications on file  Previous Medications   ACCU-CHEK GUIDE TEST STRIP    USE TO CHECK BLOOD SUGAR 4 TIMES DAILY   ACCU-CHEK SOFTCLIX LANCETS LANCETS    4 (four) times daily.   ACETAMINOPHEN  (TYLENOL ) 500 MG TABLET    Take 2 tablets (1,000 mg total) by mouth every 8 (eight) hours.   CABOTEGRAVIR  & RILPIVIRINE  ER (CABENUVA ) 600 & 900 MG/3ML INJECTION    Inject 1 kit into the muscle every 2 (two) months.   CABOTEGRAVIR  & RILPIVIRINE  ER (CABENUVA ) 600 & 900 MG/3ML INJECTION    Inject 1 kit into the muscle every 30 (thirty) days.   HYDROCODONE -ACETAMINOPHEN  (NORCO/VICODIN) 5-325 MG TABLET    Take 1 tablet by mouth every 4 (four) hours as needed.   IBUPROFEN  (ADVIL ) 600 MG TABLET    Take 1 tablet (600 mg total) by mouth every 6 (six) hours as needed.   SIMETHICONE  (MYLICON) 80 MG CHEWABLE TABLET    Chew 1 tablet (80 mg total) by mouth 3 (three) times daily after meals.  Modified Medications   No medications on file  Discontinued Medications   No medications on file    Labs: Lab Results  Component Value Date   HIV1RNAQUANT Not Detected 11/20/2022   HIV1RNAQUANT <20 (H) 07/31/2022   HIV1RNAQUANT <20 (H) 05/24/2022   CD4TABS 818 08/29/2021   CD4TABS 776 04/11/2021   CD4TABS 527 06/06/2020    RPR and STI Lab Results  Component Value Date   LABRPR NON-REACTIVE 05/24/2022   LABRPR NON-REACTIVE 04/24/2022   LABRPR NON-REACTIVE 04/09/2022   LABRPR  NON-REACTIVE 09/19/2021   LABRPR NON-REACTIVE 04/11/2021    STI Results GC CT  09/09/2022 11:00 AM Negative  Negative   05/24/2022 10:16 AM Negative  Negative   05/24/2022  9:45 AM Negative  Negative   04/24/2022 10:09 AM Negative  Negative   04/09/2022 11:26 AM Negative  Negative   09/19/2021 10:55 AM Negative  Negative   03/18/2020  6:56 PM Negative  Negative   12/18/2019  8:07 PM Negative  Negative   11/12/2019 10:54 AM Negative  Negative   11/05/2019 12:00 AM  Negative      03/17/2019 12:00 AM Negative  Negative      This result is from an external source.    Hepatitis B Lab Results  Component Value Date   HEPBSAB NON-REACTIVE 11/20/2022   HEPBSAG NON-REACTIVE 06/20/2020   HEPBCAB NON-REACTIVE 06/20/2020   Hepatitis C Lab Results  Component Value Date   HEPCAB NON-REACTIVE 06/20/2020   Hepatitis A Lab Results  Component Value Date   HAV REACTIVE (A) 11/12/2019   Lipids: Lab Results  Component Value Date   CHOL 154 11/12/2019   TRIG 198 (H) 11/12/2019   HDL 51 11/12/2019   CHOLHDL 3.0 11/12/2019   LDLCALC 73 11/12/2019    Current HIV Regimen: None (previusly Cabenuva )  Assessment: Miranda George is here today to follow up for her HIV infection. She was previously on Cabenuva  but last  received her injections on 01/28/23. Did not start oral therapy in the meantime. She states that she forgot about her appointment and has been trying to come in but has not been able to make it. Spent time discussing the importance of staying on task if she were to restart Cabenuva . Counseled on the risk of resistance when not started on oral therapy after injections are missed. We will check a genotype and genosure archive (in case she is still undetectable) and await those results before potentially restarting Cabenuva . Will start Symtuza in the meantime as there is a chance of cross-resistance with Biktarvy  so want to avoid that.   Discussed how to take Symtuza - one pill once daily with  food. Will send to [facility] per patient request. Will schedule a follow up visit with Dr. Shereen Dike to discuss results and see if she is still a candidate for Cabenuva . All questions answered.  Plan: - HIV RNA with reflex - Genosure archive - Start Symtuza once daily - Follow up with Dr. Shereen Dike on 06/25/23  Jazzalynn Rhudy L. Danecia Underdown, PharmD, BCIDP, AAHIVP, CPP Clinical Pharmacist Practitioner Infectious Diseases Clinical Pharmacist [facility] 05/17/2023, 9:25 AM

## 2023-05-20 ENCOUNTER — Ambulatory Visit (INDEPENDENT_AMBULATORY_CARE_PROVIDER_SITE_OTHER): Admitting: Pharmacist

## 2023-05-20 ENCOUNTER — Other Ambulatory Visit: Payer: Self-pay

## 2023-05-20 DIAGNOSIS — B20 Human immunodeficiency virus [HIV] disease: Secondary | ICD-10-CM | POA: Diagnosis not present

## 2023-05-20 MED ORDER — SYMTUZA 800-150-200-10 MG PO TABS
1.0000 | ORAL_TABLET | Freq: Every day | ORAL | 3 refills | Status: DC
Start: 2023-05-20 — End: 2023-06-25

## 2023-05-20 NOTE — Telephone Encounter (Signed)
 error

## 2023-05-20 NOTE — Progress Notes (Signed)
 [facility] contacted via phone to schedule lab pickup and faxed requisition to [facility]. [employer]

## 2023-05-23 LAB — HIV RNA, RTPCR W/R GT (RTI, PI,INT)
HIV 1 RNA Quant: <20 {copies}/mL — AB
HIV-1 RNA Quant, Log: <1.3 {Log_copies}/mL — AB

## 2023-05-29 NOTE — Progress Notes (Signed)
 The ASCVD Risk score (Arnett DK, et al., 2019) failed to calculate for the following reasons:   Cannot find a previous HDL lab   Cannot find a previous total cholesterol lab  Arlon Bergamo, BSN, RN

## 2023-06-11 ENCOUNTER — Other Ambulatory Visit (HOSPITAL_COMMUNITY): Payer: Self-pay

## 2023-06-14 ENCOUNTER — Telehealth: Payer: Self-pay | Admitting: Pharmacist

## 2023-06-14 NOTE — Telephone Encounter (Signed)
 I will honestly let Cassie weight in since she has been seeing patient consistently. No updated weight information available since the fall.

## 2023-06-14 NOTE — Telephone Encounter (Signed)
 Archive from 05/20/23 resulted clear of any new genotypic resistance mutations. No resistance to Cabenuva  components. Added screenshots to media tab and placed fax in provider box. Will follow up with Dr. Shereen Dike next month.  Nicklas Barns, PharmD, CPP, BCIDP, AAHIVP Clinical Pharmacist Practitioner Infectious Diseases Clinical Pharmacist [facility]

## 2023-06-17 NOTE — Telephone Encounter (Signed)
 I am ok with her restarting. She was usually really good about coming to her appointments. I didn't notice anything regarding her weight. I did not check that at her appointments. She sees you next Tuesday 6/10!

## 2023-06-17 NOTE — Telephone Encounter (Signed)
 She definitely wanted to when I saw her earlier this month, but yea double check. Thanks!

## 2023-06-17 NOTE — Telephone Encounter (Signed)
 Ok thank you  I will check to see if she wants back

## 2023-06-25 ENCOUNTER — Ambulatory Visit (INDEPENDENT_AMBULATORY_CARE_PROVIDER_SITE_OTHER): Admitting: Internal Medicine

## 2023-06-25 ENCOUNTER — Encounter: Payer: Self-pay | Admitting: Internal Medicine

## 2023-06-25 ENCOUNTER — Other Ambulatory Visit: Payer: Self-pay

## 2023-06-25 VITALS — BP 134/85 | HR 84 | Resp 16 | Ht 60.0 in | Wt 211.2 lb

## 2023-06-25 DIAGNOSIS — B20 Human immunodeficiency virus [HIV] disease: Secondary | ICD-10-CM | POA: Diagnosis not present

## 2023-06-25 DIAGNOSIS — Z113 Encounter for screening for infections with a predominantly sexual mode of transmission: Secondary | ICD-10-CM | POA: Diagnosis not present

## 2023-06-25 MED ORDER — BICTEGRAVIR-EMTRICITAB-TENOFOV 50-200-25 MG PO TABS: 1.0000 | ORAL_TABLET | Freq: Every day | ORAL | 1 refills | Status: DC

## 2023-06-25 NOTE — Patient Instructions (Signed)
 Will have pharmacy set you up with injection cabenuva    Will prescribe biktarvy  for 1 month with 1 refill in case you can't get in sooner   Blood and urine test today

## 2023-06-25 NOTE — Progress Notes (Signed)
 [facility]  Patient Active Problem List   Diagnosis Date Noted   Postpartum care following cesarean delivery 05/30/2020   History of cesarean section 05/02/2020   Transient hypertension of pregnancy in third trimester 04/21/2020   Gestational diabetes 02/19/2020   Language barrier 11/13/2019   HIV (human immunodeficiency virus infection) (HCC) 11/13/2019   History of C-section 11/13/2019      Subjective:    Patient ID: Miranda George, female    DOB: June 18, 1980, 43 y.o.   MRN: [id number]  Cc: HIV care  HPI:  Miranda George is a 43 y.o. female here for continued HIV care  06/25/23 id clinic f/u Patient was doing well on cabenuva  and she had a lot of traveling and she tried to call front desk to reschedule but couldn't. She was given biktarvy  to take pending repeat genosure 05/2023 and that was fine She wants to be back on injection No other complaint She was going to [place] [facility] for chronic bilateral groin pain since giving birth to her child -- unclear cause. She takes some kind of medication for this but hasn't been as didn't think it was helping. No missed dose biktarvy  the last 2 weeks  She also has recent pap smear near [place] and said it was normal  She has a pcp in [place]    09/20/22 id clinic f/u Doing well on cabenuva  started a few months ago 08/2022 urine gc/chlam negative 07/2022 hiv rna negative No concern with her health today   Also has lower left abd pain -- 1 month and an ultrasound was done in ED and suggestive of uterine fibroid so pending gynecology    04/09/22 id clinic f/u 2 months lower back back no red flag and worse with activity not waking her up at night No b sx Feels well otherwise Remains in same marriage Baby doing well Compliant with biktarvy  We discussed cabenuva  and she is interested. She has medicaid She was referred to primary care previously but didn't  keep appointment Needs pap/mammogram  09/19/21 id clinic Doing well  No complaint Her husband's hiv status is still a mystery to her -- they don't talk about this  She has no missed dose of biktarvy  Her viral load is <20  She has no sx but ok with std testing    05/09/21 id f/u Headache is not as persistent now but still intense. She currently takes 2 times a week. Intensity is 7-8. Frontal/sinus distribution/post-orbital. Some light sensitivity. Episode lasts average 2-3 hours but some times could last the whole day. No focal weakness/numbness Discuss if she misses her biktarvy  doses and she said no    04/11/21 id f/u She is here with language interpreter and her baby (32 months old)  Patient compliant with biktarvy  Complains of 1 month of headache. Frontal. 7/10. Tylenol  helps. No worsening factor. No visual change/numbness/tingling/weakness. Sometimes dizzi. Some time with chest discomfort. No jaw pain. No hearing disturbance.  Not positional Not worse with straining/coughing Sudden onset No new medication No birth control pill No nasal congestion Some times light sensitivity; no noise sensitivity No fever, chill No smoker No sinus congestion  Eating drinking ok Drink coffee. 1 cup a day in morning for several years. Doesn't make headache worse/better  Patient no hx blood clot No family hx migraine  I reviewed her meds list -- Tylenol  prn. biktarvy    9/27 id f/u No longer on metformin   Takes biktarvy  Virologically and immunologically well controlled No missed biktarvy  dose last 4 weeks Baby boy is 5 months now, lives with mom (and husband, and older son). No issue. Not breast feeding. Seronegative No f/c, headache, rash, joint pain, vaginal discharge/pelvic pain Reports upper epigastric pain radiate to both sides -- since taking biktarvy  Some heart burn No pcp yet -- trying to get internal medicine clinic here as pcp.  Doesn't take acid blocker Milk gives her some  relief Denies taking nsaids  Last well woman exam/pap during pregnancy. She states she wants to do it here  Husband told her he tested twice and negative. Patient without other sexual exposure   06/20/20 id f/u Patient delivered healthy child 05/2020 Her hiv viral load has been negative several months, on ART, prior to delivery She continues to be compliant with ART with no missed dose Reviewed labs with her Her lft is a little elevated on 06/06/2020; previously exposed to hep a; 10/2019 hep b and c serology negative. She had no issues with peripartum hypertension or HELLP I reviewed her immunization record; only one heplisav dose 12/2019. She doesn't recall getting the second dose yet She is without n/v/diarrhea/abd pain She does feel dizzy since delivery of her child; feels unsteady at times as well but no dyspnea/fatigue. She endorses turning head in any direction makes it worse.  She has good appetite She doesn't breast feed because of the hiv  She is here with her newborn boy of 1 month. His prelim hiv test is negative; he is not on any hiv pill; he'll have repeat hiv testing again soon  Social review --  Lives with her husband Husband: hasn't repeated testing yet -- worried about not being able to pay Substance use: no smoking, drinking, drugs Work: housewife  03/07/20 id f/u Doing well Good prenatal care Gestational diabetes -- advised diet/exercise; no oral or insulin yet No f/c No vaginal bleeding/discharge No rash Husband still not getting hiv testing repeat/care yet; patient tried to call [facility] but no information yet Compliant with truvada /tivicay  Reviewed labs; hiv undetectable   background -------------  She was seen in the ED on 12/03 for abd pain and vaginal bleeding, briefly admitted and dx'ed with subchorionic hematoma. Gc/chlamydia, wet mount negative. The vaginal bleeding had stopped yesterday, and the abdominal pain had also gone  #HIV Dx'ed 10/2019;  heterosexual; denies ivdu/promiscuity; patient is married Patient had some blood transfusion during her 2nd pregnancy Husband tested and told her he was negative 2 months ago although there is some question as he tried to call back to the clinic and haven't heard Patient married 1 year; together for 4 years She was seen in [facility] on 10/28 with our pharmacist Cassie and started on tivicay  and truvada . She had some nausea at the beginning but no longer. She doesn't miss any dose. 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, v179I/V, R211K PR mutation -- I62v, L63p, v77I/V, I93L There were no phenotypic resistance to NRTI, nnrti, or PI Baseline HIV labs: [id number] negative 11/12/2019 hepatitis b sAb, sAg, cAb; hep cAb negative; hep A Ab positive 11/12/2019 quant gold negative  She has fatigue since pregnancy and that hasn't changed She also endorsed some mild depression but no hi/si (because of positive hiv)  No fever, chill, nightsweat No headache, visual change, blurriness No cough, chest pain, sob No sore throat No diarrhea No urinary urgency, dysuria, flank pain No bruising No joint  pain, myalgia, muscle weakness No focal weakness No rash   No Known Allergies    Outpatient Medications Prior to Visit  Medication Sig Dispense Refill   Darunavir-Cobicistat-Emtricitabine -Tenofovir  Alafenamide (SYMTUZA ) 800-150-200-10 MG TABS Take 1 tablet by mouth daily with breakfast. 30 tablet 3   ACCU-CHEK GUIDE test strip USE TO CHECK BLOOD SUGAR 4 TIMES DAILY (Patient not taking: Reported on 06/25/2023)     Accu-Chek Softclix Lancets lancets 4 (four) times daily. (Patient not taking: Reported on 06/25/2023)     acetaminophen  (TYLENOL ) 500 MG tablet Take 2 tablets (1,000 mg total) by mouth every 8 (eight) hours. (Patient not taking: Reported on 06/25/2023) 30 tablet 0   HYDROcodone -acetaminophen  (NORCO/VICODIN) 5-325 MG tablet Take 1 tablet by mouth  every 4 (four) hours as needed. (Patient not taking: Reported on 06/25/2023) 10 tablet 0   ibuprofen  (ADVIL ) 600 MG tablet Take 1 tablet (600 mg total) by mouth every 6 (six) hours as needed. (Patient not taking: Reported on 06/25/2023) 30 tablet 0   simethicone  (MYLICON) 80 MG chewable tablet Chew 1 tablet (80 mg total) by mouth 3 (three) times daily after meals. (Patient not taking: Reported on 06/25/2023) 30 tablet 0   No facility-administered medications prior to visit.     Past Medical History:  Diagnosis Date   Anemia    Blood transfusion without reported diagnosis    Cholestasis during pregnancy    Fibroid    GERD (gastroesophageal reflux disease)    Gestational diabetes    HIV (human immunodeficiency virus infection) (HCC)       Past Surgical History:  Procedure Laterality Date   APPENDECTOMY     18 years ago   APPENDECTOMY     CESAREAN SECTION  2012   CESAREAN SECTION     CESAREAN SECTION N/A 05/02/2020   Procedure: CESAREAN SECTION;  Surgeon: Janeane Mealy, MD;  Location: [facility];  Service: Obstetrics;  Laterality: N/A;      Family History  Problem Relation Age of Onset   Hypertension Mother    Diabetes Mother    Thyroid  disease Mother    Hypertension Father    Diabetes Father       Social History   Socioeconomic History   Marital status: Married    Spouse name: Not on file   Number of children: Not on file   Years of education: Not on file   Highest education level: Not on file  Occupational History   Not on file  Tobacco Use   Smoking status: Never   Smokeless tobacco: Never  Vaping Use   Vaping status: Never Used  Substance and Sexual Activity   Alcohol use: Never    Comment: occasionally   Drug use: Never   Sexual activity: Yes    Birth control/protection: None    Comment: declined condoms  Other Topics Concern   Not on file  Social History Narrative   ** Merged History Encounter **       Social Drivers of Health   Financial  Resource Strain: Not on file  Food Insecurity: Food Insecurity Present (05/30/2020)   Hunger Vital Sign    Worried About Running Out of Food in the Last Year: Sometimes true    Ran Out of Food in the Last Year: Sometimes true  Transportation Needs: No Transportation Needs (05/30/2020)   PRAPARE - Administrator, Civil Service (Medical): No    Lack of Transportation (Non-Medical): No  Physical Activity: Not on file  Stress:  Not on file  Social Connections: Not on file  Intimate Partner Violence: Not on file           Objective:    BP 134/85   Pulse 84   Resp 16   Ht 5' (1.524 m)   Wt 211 lb 3.2 oz (95.8 kg)   LMP 06/13/2023 (Approximate)   SpO2 99%   BMI 41.25 kg/m  Nursing note and vital signs reviewed.  Physical Exam General/constitutional: no distress, pleasant; obese HEENT: Normocephalic, PER, Conj Clear, EOMI, Oropharynx clear Neck supple CV: rrr no mrg Lungs: clear to auscultation, normal respiratory effort Abd: Soft, Nontender Ext: no edema Skin: No Rash Neuro: nonfocal MSK: no peripheral joint swelling/tenderness/warmth; back spines nontender         Labs: Reviewed Lab Results  Component Value Date   WBC 6.2 09/09/2022   HGB 12.7 09/09/2022   HCT 39.1 09/09/2022   MCV 86.7 09/09/2022   PLT 231 09/09/2022   Last metabolic panel Lab Results  Component Value Date   GLUCOSE 191 (H) 09/09/2022   NA 136 09/09/2022   K 4.0 09/09/2022   CL 102 09/09/2022   CO2 24 09/09/2022   BUN 9 09/09/2022   CREATININE 0.76 09/09/2022   GFRNONAA >60 09/09/2022   GFRAA 117 06/20/2020   CALCIUM 8.9 09/09/2022   PROT 6.8 09/09/2022   ALBUMIN 3.6 09/09/2022   LABGLOB 2.9 04/21/2020   AGRATIO 1.2 04/21/2020   BILITOT 0.9 09/09/2022   ALKPHOS 61 09/09/2022   AST 43 (H) 09/09/2022   ALT 58 (H) 09/09/2022   ANIONGAP 10 09/09/2022    Hiv                cd4 (%)      /    hiv vl 05/2023                           /     <20 07/2022                            /     <20 08/2021           818 (46)   /     <20 04/11/21                           /    41 10/11/20                           /    ud 02/14                                /    <20 12/28               340 (32)    /     32k  Serology: 08/2022 urine gc/chlam negative 05/2022 rpr negative 03/2021 rpr nonreactive 12/18/19 urine gc/chlam and wetmount negative 10/28 quant gold negative; hep b/c negative; hep a reactive; rpr nonreactive 10/28 hla [id number] negative 10/28 hiv genotype no phenotypic resistance    Imaging: 04/22/21 brain mri FINDINGS: Brain: No acute infarct, mass effect or extra-axial collection. No acute or chronic hemorrhage. Normal white matter signal, parenchymal volume and CSF spaces. The midline structures are normal.   Vascular:  Major flow voids are preserved.   Skull and upper cervical spine: Normal calvarium and skull base. Visualized upper cervical spine and soft tissues are normal.   Sinuses/Orbits:No paranasal sinus fluid levels or advanced mucosal thickening. No mastoid or middle ear effusion. Normal orbits.   IMPRESSION: Normal brain MRI.   Assessment & Plan:   Patient Active Problem List   Diagnosis Date Noted   Postpartum care following cesarean delivery 05/30/2020   History of cesarean section 05/02/2020   Transient hypertension of pregnancy in third trimester 04/21/2020   Gestational diabetes 02/19/2020   Language barrier 11/13/2019   HIV (human immunodeficiency virus infection) (HCC) 11/13/2019   History of C-section 11/13/2019     Problem List Items Addressed This Visit   None Visit Diagnoses       HIV disease (HCC)    -  Primary   Relevant Medications   bictegravir-emtricitabine -tenofovir  AF (BIKTARVY ) 50-200-25 MG TABS tablet   Other Relevant Orders   HIV 1 RNA quant-no reflex-bld   CBC   COMPLETE METABOLIC PANEL WITHOUT GFR   T-helper cells (CD4) count     Screening for STDs (sexually transmitted diseases)       Relevant  Orders   C. trachomatis/N. gonorrhoeae RNA   RPR     #hiv dx'ed during pregnancy. Suspect transmission from husband. No other risk factors As of this visit she is not sure of her husband's hiv status. I have advised her to have an open discussion with her husband to get retested. 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 No evidence IRIS. Baseline cd4 prior to med 340 (32%)  Lab Results  Component Value Date   HIV1RNAQUANT <20 DETECTED (A) 05/20/2023   Lab Results  Component Value Date   CD4TCELL 46 08/29/2021   CD4TABS 818 08/29/2021   Tolerating/compliant with biktarvy  Doing well on biktarvy   04/09/22 discussed cabenuva  and she is interested --> doing well on it since and continue to be virologically controlled   06/25/23 was on cabenuva  but missed dose due to busy schedule and unable to reschedule -- bridged with biktarvy . Genosure no resistance. Wants back on cabenuva    -discussed u=u -encourage compliance -continue current HIV medication biktarvy  until pharmacy can see her for cabenuva  -labs today -f/u with me in 1 year -- cabenuva  with pharmacy      #hcm -vaccination prevnar 11/12/19 Pneumovax 06/2020 tdap 01/2020 covid moderna 2 shots by 07/2019 Meningitis booster 07/2022 -hepatitis heplisav started 12/2019 finished 06/2020 -- repeat hep b sAb 09/2020 213 Will resend hepatitis b sAb quant next visit to determine durability of heplisav -std screening 08/2022 urine gc/chlam negative; 05/2022 rpr negative 06/25/23 rpr and urine gc/chlam -annual 10/2019 quant gold negative 06/2020 rpr negative -[facility] Pcp in [place] Recent pap smear 05/2023 ?reflex hpv and colpsocopy was normal --> plan for repeat pap in a year     Follow-up: Return in about 1 year (around 06/24/2024).     Jamesetta Mcbride, MD [facility] -- -- pager   [phone] cell 06/25/2023, 4:19 PM

## 2023-06-26 LAB — C. TRACHOMATIS/N. GONORRHOEAE RNA
C. trachomatis RNA, TMA: NOT DETECTED
N. gonorrhoeae RNA, TMA: NOT DETECTED

## 2023-06-27 LAB — CBC
HCT: 39.1 % (ref 35.0–45.0)
Hemoglobin: 12.7 g/dL (ref 11.7–15.5)
MCH: 28.9 pg (ref 27.0–33.0)
MCHC: 32.5 g/dL (ref 32.0–36.0)
MCV: 89.1 fL (ref 80.0–100.0)
MPV: 10.5 fL (ref 7.5–12.5)
Platelets: 250 10*3/uL (ref 140–400)
RBC: 4.39 10*6/uL (ref 3.80–5.10)
RDW: 13.1 % (ref 11.0–15.0)
WBC: 6.4 10*3/uL (ref 3.8–10.8)

## 2023-06-27 LAB — COMPLETE METABOLIC PANEL WITHOUT GFR
AG Ratio: 1.4 (calc) (ref 1.0–2.5)
ALT: 54 U/L — ABNORMAL HIGH (ref 6–29)
AST: 44 U/L — ABNORMAL HIGH (ref 10–30)
Albumin: 4.3 g/dL (ref 3.6–5.1)
Alkaline phosphatase (APISO): 64 U/L (ref 31–125)
BUN: 9 mg/dL (ref 7–25)
CO2: 26 mmol/L (ref 20–32)
Calcium: 9.4 mg/dL (ref 8.6–10.2)
Chloride: 104 mmol/L (ref 98–110)
Creat: 0.74 mg/dL (ref 0.50–0.99)
Globulin: 3 g/dL (ref 1.9–3.7)
Glucose, Bld: 76 mg/dL (ref 65–99)
Potassium: 3.9 mmol/L (ref 3.5–5.3)
Sodium: 138 mmol/L (ref 135–146)
Total Bilirubin: 0.5 mg/dL (ref 0.2–1.2)
Total Protein: 7.3 g/dL (ref 6.1–8.1)

## 2023-06-27 LAB — RPR: RPR Ser Ql: NONREACTIVE

## 2023-06-27 LAB — HIV-1 RNA QUANT-NO REFLEX-BLD
HIV 1 RNA Quant: NOT DETECTED {copies}/mL
HIV-1 RNA Quant, Log: NOT DETECTED {Log_copies}/mL

## 2023-06-27 LAB — T-HELPER CELLS (CD4) COUNT (NOT AT ARMC)
Absolute CD4: 891 {cells}/uL (ref 490–1740)
CD4 T Helper %: 48 % (ref 30–61)
Total lymphocyte count: 1840 {cells}/uL (ref 850–3900)

## 2023-08-08 ENCOUNTER — Other Ambulatory Visit: Payer: Self-pay

## 2023-08-08 ENCOUNTER — Other Ambulatory Visit (HOSPITAL_COMMUNITY): Payer: Self-pay

## 2023-08-08 ENCOUNTER — Other Ambulatory Visit: Payer: Self-pay | Admitting: Pharmacist

## 2023-08-08 DIAGNOSIS — B20 Human immunodeficiency virus [HIV] disease: Secondary | ICD-10-CM

## 2023-08-08 MED ORDER — CABOTEGRAVIR & RILPIVIRINE ER 600 & 900 MG/3ML IM SUER
1.0000 | INTRAMUSCULAR | 1 refills | Status: DC
  Filled 2023-08-08: qty 6, 30d supply, fill #0
  Filled 2023-09-02: qty 6, 30d supply, fill #1

## 2023-08-08 NOTE — Progress Notes (Signed)
 Specialty Pharmacy Refill Coordination Note  Miranda George is a 43 y.o. female assessed today regarding refills of clinic administered specialty medication(s) Cabotegravir  & Rilpivirine  (CABENUVA )   Clinic requested Courier to Provider Office   Delivery date: 08/12/23   Verified address: [address] [place] [address]   Medication will be filled on 08/09/23.

## 2023-08-08 NOTE — Progress Notes (Signed)
 Per Dr. Overton, patient good to restart Cabenuva . Aileen scheduled with Cassie on 08/13/23.  Alan Geralds, PharmD, CPP, BCIDP, AAHIVP Clinical Pharmacist Practitioner Infectious Diseases Clinical Pharmacist [facility] for [facility]

## 2023-08-12 ENCOUNTER — Telehealth: Payer: Self-pay

## 2023-08-12 NOTE — Telephone Encounter (Signed)
 [facility] Patient Advocate Encounter  Patient's medications Cabenuva  have been couriered to [facility] from [facility] and will be administered at the patients appointment on 0729/25.  Arland Hutchinson, CPhT Specialty Pharmacy Patient [facility] for [facility] Disease Phone: [phone] Fax:  [phone]

## 2023-08-12 NOTE — Progress Notes (Unsigned)
 HPI: Miranda George is a 43 y.o. female who presents to the [facility] pharmacy clinic for Cabenuva  administration.  Patient Active Problem List   Diagnosis Date Noted   Postpartum care following cesarean delivery 05/30/2020   History of cesarean section 05/02/2020   Transient hypertension of pregnancy in third trimester 04/21/2020   Gestational diabetes 02/19/2020   Language barrier 11/13/2019   HIV (human immunodeficiency virus infection) (HCC) 11/13/2019   History of C-section 11/13/2019    Patient's Medications  New Prescriptions   No medications on file  Previous Medications   ACCU-CHEK GUIDE TEST STRIP    USE TO CHECK BLOOD SUGAR 4 TIMES DAILY   ACCU-CHEK SOFTCLIX LANCETS LANCETS    4 (four) times daily.   ACETAMINOPHEN  (TYLENOL ) 500 MG TABLET    Take 2 tablets (1,000 mg total) by mouth every 8 (eight) hours.   BICTEGRAVIR-EMTRICITABINE -TENOFOVIR  AF (BIKTARVY ) 50-200-25 MG TABS TABLET    Take 1 tablet by mouth daily.   CABOTEGRAVIR  & RILPIVIRINE  ER (CABENUVA ) 600 & 900 MG/3ML INJECTION    Inject 1 kit into the muscle every 30 (thirty) days.   HYDROCODONE -ACETAMINOPHEN  (NORCO/VICODIN) 5-325 MG TABLET    Take 1 tablet by mouth every 4 (four) hours as needed.   IBUPROFEN  (ADVIL ) 600 MG TABLET    Take 1 tablet (600 mg total) by mouth every 6 (six) hours as needed.   SIMETHICONE  (MYLICON) 80 MG CHEWABLE TABLET    Chew 1 tablet (80 mg total) by mouth 3 (three) times daily after meals.  Modified Medications   No medications on file  Discontinued Medications   No medications on file    Allergies: No Known Allergies  Labs: Lab Results  Component Value Date   HIV1RNAQUANT NOT DETECTED 06/25/2023   HIV1RNAQUANT <20 DETECTED (A) 05/20/2023   HIV1RNAQUANT Not Detected 11/20/2022   CD4TABS 818 08/29/2021   CD4TABS 776 04/11/2021   CD4TABS 527 06/06/2020    RPR and STI Lab Results  Component Value Date   LABRPR NON-REACTIVE 06/25/2023   LABRPR NON-REACTIVE 05/24/2022    LABRPR NON-REACTIVE 04/24/2022   LABRPR NON-REACTIVE 04/09/2022   LABRPR NON-REACTIVE 09/19/2021    STI Results GC CT  09/09/2022 11:00 AM Negative  Negative   05/24/2022 10:16 AM Negative  Negative   05/24/2022  9:45 AM Negative  Negative   04/24/2022 10:09 AM Negative  Negative   04/09/2022 11:26 AM Negative  Negative   09/19/2021 10:55 AM Negative  Negative   03/18/2020  6:56 PM Negative  Negative   12/18/2019  8:07 PM Negative  Negative   11/12/2019 10:54 AM Negative  Negative   11/05/2019 12:00 AM  Negative      03/17/2019 12:00 AM Negative  Negative      This result is from an external source.    Hepatitis B Lab Results  Component Value Date   HEPBSAB NON-REACTIVE 11/20/2022   HEPBSAG NON-REACTIVE 06/20/2020   HEPBCAB NON-REACTIVE 06/20/2020   Hepatitis C Lab Results  Component Value Date   HEPCAB NON-REACTIVE 06/20/2020   Hepatitis A Lab Results  Component Value Date   HAV REACTIVE (A) 11/12/2019   Lipids: Lab Results  Component Value Date   CHOL 154 11/12/2019   TRIG 198 (H) 11/12/2019   HDL 51 11/12/2019   CHOLHDL 3.0 11/12/2019   LDLCALC 73 11/12/2019    Current HIV Regimen: Biktarvy   TARGET DATE: Today - the 29th  Assessment: Miranda George presents today for her first initiation injection for Cabenuva . She was previously on  injections but missed a few appointments and was placed back on oral treatment. She is restarting today after a genosure showed no resistant mutations.   Counseled that Cabenuva  is two separate intramuscular injections in the gluteal muscle on each side for each visit. Explained that the second injection is 30 days after the initial injection then every 2 months thereafter. Discussed the rare but significant chance of developing resistance despite compliance. Explained that showing up to injection appointments is very important and warned that if 2 appointments are missed, it will be reassessed by their provider whether they are a  good candidate for injection therapy. Counseled on possible side effects associated with the injections such as injection site pain, which is usually mild to moderate in nature, injection site nodules, and injection site reactions. Asked to call the clinic or send me a mychart message if they experience any issues, such as fatigue, nausea, headache, rash, or dizziness. Advised that they can take ibuprofen  or tylenol  for injection site pain if needed.   Administered cabotegravir  600mg /30mL in left upper outer quadrant of the gluteal muscle. Administered rilpivirine  900 mg/3mL in the right upper outer quadrant of the gluteal muscle. Monitored patient for 10 minutes after injection. Injections were tolerated well without issue. Counseled to stop taking Biktarvy  after today's dose and to call with any issues that may arise. Will make follow up appointments for second initiation injection in 30 days and then maintenance injections every 2 months thereafter.   Plan: - Stop Biktarvy  after today's dose - First Cabenuva  injections administered - Second set of initiation injections scheduled for 09/06/23 with Miranda George - Maintenance injections scheduled for 11/08/23 with Miranda George and 01/07/24 with Miranda George (she prefers Friday morning appointments) - Call with any issues or questions  Jolanda Mccann L. Devony Mcgrady, PharmD, BCIDP, AAHIVP, CPP Clinical Pharmacist Practitioner - Infectious Diseases Clinical Pharmacist Lead - Specialty Pharmacy [facility] for Infectious Disease

## 2023-08-13 ENCOUNTER — Ambulatory Visit: Admitting: Pharmacist

## 2023-08-13 ENCOUNTER — Other Ambulatory Visit: Payer: Self-pay

## 2023-08-13 DIAGNOSIS — B20 Human immunodeficiency virus [HIV] disease: Secondary | ICD-10-CM

## 2023-08-13 MED ORDER — CABOTEGRAVIR & RILPIVIRINE ER 600 & 900 MG/3ML IM SUER
1.0000 | Freq: Once | INTRAMUSCULAR | Status: AC
  Administered 2023-08-13: 1 via INTRAMUSCULAR

## 2023-09-02 ENCOUNTER — Other Ambulatory Visit (HOSPITAL_COMMUNITY): Payer: Self-pay

## 2023-09-02 ENCOUNTER — Other Ambulatory Visit: Payer: Self-pay

## 2023-09-02 NOTE — Progress Notes (Signed)
 Specialty Pharmacy Refill Coordination Note  Miranda George is a 43 y.o. female assessed today regarding refills of clinic administered specialty medication(s) Cabotegravir  & Rilpivirine  (CABENUVA )   Clinic requested Courier to Provider Office   Delivery date: 09/05/23   Verified address: [address] [place] [address]   Medication will be filled on 09/04/23.

## 2023-09-04 ENCOUNTER — Other Ambulatory Visit: Payer: Self-pay

## 2023-09-05 ENCOUNTER — Telehealth: Payer: Self-pay

## 2023-09-05 NOTE — Telephone Encounter (Signed)
 [facility] Patient Advocate Encounter  Patient's medications CABENUVA  have been couriered to [facility] from [facility] and will be administered at the patients appointment on 09/06/23.  Charmaine Sharps, CPhT Specialty Pharmacy Patient [facility] for Infectious Disease Phone: [phone] Fax:  [phone]

## 2023-09-05 NOTE — Progress Notes (Unsigned)
 HPI: Miranda George is a 43 y.o. female who presents to the [facility] pharmacy clinic for Cabenuva  administration.  Patient Active Problem List   Diagnosis Date Noted   Postpartum care following cesarean delivery 05/30/2020   History of cesarean section 05/02/2020   Transient hypertension of pregnancy in third trimester 04/21/2020   Gestational diabetes 02/19/2020   Language barrier 11/13/2019   HIV (human immunodeficiency virus infection) (HCC) 11/13/2019   History of C-section 11/13/2019    Patient's Medications  New Prescriptions   No medications on file  Previous Medications   ACCU-CHEK GUIDE TEST STRIP    USE TO CHECK BLOOD SUGAR 4 TIMES DAILY   ACCU-CHEK SOFTCLIX LANCETS LANCETS    4 (four) times daily.   ACETAMINOPHEN  (TYLENOL ) 500 MG TABLET    Take 2 tablets (1,000 mg total) by mouth every 8 (eight) hours.   BICTEGRAVIR-EMTRICITABINE -TENOFOVIR  AF (BIKTARVY ) 50-200-25 MG TABS TABLET    Take 1 tablet by mouth daily.   CABOTEGRAVIR  & RILPIVIRINE  ER (CABENUVA ) 600 & 900 MG/3ML INJECTION    Inject 1 kit into the muscle every 30 (thirty) days.   HYDROCODONE -ACETAMINOPHEN  (NORCO/VICODIN) 5-325 MG TABLET    Take 1 tablet by mouth every 4 (four) hours as needed.   IBUPROFEN  (ADVIL ) 600 MG TABLET    Take 1 tablet (600 mg total) by mouth every 6 (six) hours as needed.   SIMETHICONE  (MYLICON) 80 MG CHEWABLE TABLET    Chew 1 tablet (80 mg total) by mouth 3 (three) times daily after meals.  Modified Medications   No medications on file  Discontinued Medications   No medications on file    Allergies: No Known Allergies  Labs: Lab Results  Component Value Date   HIV1RNAQUANT NOT DETECTED 06/25/2023   HIV1RNAQUANT <20 DETECTED (A) 05/20/2023   HIV1RNAQUANT Not Detected 11/20/2022   CD4TABS 818 08/29/2021   CD4TABS 776 04/11/2021   CD4TABS 527 06/06/2020    RPR and STI Lab Results  Component Value Date   LABRPR NON-REACTIVE 06/25/2023   LABRPR NON-REACTIVE 05/24/2022    LABRPR NON-REACTIVE 04/24/2022   LABRPR NON-REACTIVE 04/09/2022   LABRPR NON-REACTIVE 09/19/2021    STI Results GC CT  09/09/2022 11:00 AM Negative  Negative   05/24/2022 10:16 AM Negative  Negative   05/24/2022  9:45 AM Negative  Negative   04/24/2022 10:09 AM Negative  Negative   04/09/2022 11:26 AM Negative  Negative   09/19/2021 10:55 AM Negative  Negative   03/18/2020  6:56 PM Negative  Negative   12/18/2019  8:07 PM Negative  Negative   11/12/2019 10:54 AM Negative  Negative   11/05/2019 12:00 AM  Negative      03/17/2019 12:00 AM Negative  Negative      This result is from an external source.    Hepatitis B Lab Results  Component Value Date   HEPBSAB NON-REACTIVE 11/20/2022   HEPBSAG NON-REACTIVE 06/20/2020   HEPBCAB NON-REACTIVE 06/20/2020   Hepatitis C Lab Results  Component Value Date   HEPCAB NON-REACTIVE 06/20/2020   Hepatitis A Lab Results  Component Value Date   HAV REACTIVE (A) 11/12/2019   Lipids: Lab Results  Component Value Date   CHOL 154 11/12/2019   TRIG 198 (H) 11/12/2019   HDL 51 11/12/2019   CHOLHDL 3.0 11/12/2019   LDLCALC 73 11/12/2019    TARGET DATE: 29  Assessment: Miranda George presents today for her maintenance Cabenuva  injections. Past injections were tolerated well without issues. She does report increased dry skin on  her hands and itching , but no rash. She asked if it could be related to the injections. I explained that this is likely unrelated to the cabenuva . Last HIV RNA was negative in 06/25/23. Doing well with no issues today. We discussed that she is eligible for the shingles vaccine which she agrees to get today. Next dose is due 11/06/23. Will get lipid panel today as part of yearly labs.   Administered cabotegravir  600mg /37mL in left upper outer quadrant of the gluteal muscle. Administered rilpivirine  900 mg/3mL in the right upper outer quadrant of the gluteal muscle. No issues with injections. She will follow up in 2 months  for next set of injections.  Plan: - Cabenuva  injections administered - Next injections scheduled for 11/07/23 - Administer shingles vaccine - STI testing - Lipid panel - HIV RNA level - Call with any issues or questions  Elma Fail, PharmD PGY1 Clinical Pharmacist [facility]  09/05/2023 9:25 PM

## 2023-09-06 ENCOUNTER — Other Ambulatory Visit: Payer: Self-pay

## 2023-09-06 ENCOUNTER — Ambulatory Visit (INDEPENDENT_AMBULATORY_CARE_PROVIDER_SITE_OTHER): Admitting: Pharmacist

## 2023-09-06 DIAGNOSIS — B20 Human immunodeficiency virus [HIV] disease: Secondary | ICD-10-CM | POA: Diagnosis not present

## 2023-09-06 DIAGNOSIS — Z79899 Other long term (current) drug therapy: Secondary | ICD-10-CM | POA: Diagnosis not present

## 2023-09-06 DIAGNOSIS — Z113 Encounter for screening for infections with a predominantly sexual mode of transmission: Secondary | ICD-10-CM | POA: Diagnosis not present

## 2023-09-06 DIAGNOSIS — Z23 Encounter for immunization: Secondary | ICD-10-CM | POA: Diagnosis not present

## 2023-09-06 MED ORDER — CABOTEGRAVIR & RILPIVIRINE ER 600 & 900 MG/3ML IM SUER
1.0000 | Freq: Once | INTRAMUSCULAR | Status: AC
  Administered 2023-09-06: 1 via INTRAMUSCULAR

## 2023-09-07 LAB — C. TRACHOMATIS/N. GONORRHOEAE RNA
C. trachomatis RNA, TMA: NOT DETECTED
N. gonorrhoeae RNA, TMA: NOT DETECTED

## 2023-09-10 LAB — HIV-1 RNA QUANT-NO REFLEX-BLD
HIV 1 RNA Quant: NOT DETECTED {copies}/mL
HIV-1 RNA Quant, Log: NOT DETECTED {Log_copies}/mL

## 2023-09-10 LAB — LIPID PANEL
Cholesterol: 201 mg/dL — ABNORMAL HIGH (ref <200)
HDL: 50 mg/dL (ref >=50)
LDL Cholesterol (Calc): 121 mg/dL — ABNORMAL HIGH
Non-HDL Cholesterol (Calc): 151 mg/dL — ABNORMAL HIGH (ref <130)
Total CHOL/HDL Ratio: 4 (calc) (ref <5.0)
Triglycerides: 179 mg/dL — ABNORMAL HIGH (ref <150)

## 2023-10-30 ENCOUNTER — Other Ambulatory Visit (HOSPITAL_COMMUNITY): Payer: Self-pay

## 2023-10-30 ENCOUNTER — Other Ambulatory Visit: Payer: Self-pay

## 2023-10-30 ENCOUNTER — Other Ambulatory Visit: Payer: Self-pay | Admitting: Pharmacist

## 2023-10-30 DIAGNOSIS — B20 Human immunodeficiency virus [HIV] disease: Secondary | ICD-10-CM

## 2023-10-30 MED ORDER — CABOTEGRAVIR & RILPIVIRINE ER 600 & 900 MG/3ML IM SUER
1.0000 | INTRAMUSCULAR | 5 refills | Status: AC
  Filled 2023-10-30: qty 6, 34d supply, fill #0
  Filled 2023-12-13: qty 6, 34d supply, fill #1

## 2023-10-30 NOTE — Progress Notes (Signed)
 Specialty Pharmacy Refill Coordination Note  Miranda George is a 43 y.o. female assessed today regarding refills of clinic administered specialty medication(s) Cabotegravir  & Rilpivirine  (CABENUVA )   Clinic requested No data recorded  Delivery date: 11/04/23   Verified address: [address] [place] [address]   Medication will be filled on 11/01/23.

## 2023-10-31 ENCOUNTER — Other Ambulatory Visit: Payer: Self-pay

## 2023-11-04 ENCOUNTER — Telehealth: Payer: Self-pay

## 2023-11-04 NOTE — Telephone Encounter (Signed)
 [facility] Patient Advocate Encounter  Patient's medications Cabenuva  have been couriered to [facility] from [facility] and will be administered at the patients appointment on 11/07/23.  Arland Hutchinson, CPhT Specialty Pharmacy Patient [facility] Phone: [phone] Fax:  [phone]

## 2023-11-04 NOTE — Progress Notes (Signed)
 HPI: Miranda George is a 43 y.o. female who presents to the [facility] pharmacy clinic for Cabenuva  administration.  Referring ID Physician: Dr. Overton  Patient Active Problem List   Diagnosis Date Noted   Postpartum care following cesarean delivery 05/30/2020   History of cesarean section 05/02/2020   Transient hypertension of pregnancy in third trimester 04/21/2020   Gestational diabetes 02/19/2020   Language barrier 11/13/2019   HIV (human immunodeficiency virus infection) (HCC) 11/13/2019   History of C-section 11/13/2019    Patient's Medications  New Prescriptions   No medications on file  Previous Medications   ACCU-CHEK GUIDE TEST STRIP    USE TO CHECK BLOOD SUGAR 4 TIMES DAILY   ACCU-CHEK SOFTCLIX LANCETS LANCETS    4 (four) times daily.   ACETAMINOPHEN  (TYLENOL ) 500 MG TABLET    Take 2 tablets (1,000 mg total) by mouth every 8 (eight) hours.   CABOTEGRAVIR  & RILPIVIRINE  ER (CABENUVA ) 600 & 900 MG/3ML INJECTION    Inject 1 kit into the muscle every 2 (two) months.   HYDROCODONE -ACETAMINOPHEN  (NORCO/VICODIN) 5-325 MG TABLET    Take 1 tablet by mouth every 4 (four) hours as needed.   IBUPROFEN  (ADVIL ) 600 MG TABLET    Take 1 tablet (600 mg total) by mouth every 6 (six) hours as needed.   SIMETHICONE  (MYLICON) 80 MG CHEWABLE TABLET    Chew 1 tablet (80 mg total) by mouth 3 (three) times daily after meals.  Modified Medications   No medications on file  Discontinued Medications   No medications on file    Allergies: No Known Allergies  Past Medical History: Past Medical History:  Diagnosis Date   Anemia    Blood transfusion without reported diagnosis    Cholestasis during pregnancy    Fibroid    GERD (gastroesophageal reflux disease)    Gestational diabetes    HIV (human immunodeficiency virus infection) (HCC)     Social History: Social History   Socioeconomic History   Marital status: Married    Spouse name: Not on file   Number of children: Not on file    Years of education: Not on file   Highest education level: Not on file  Occupational History   Not on file  Tobacco Use   Smoking status: Never   Smokeless tobacco: Never  Vaping Use   Vaping status: Never Used  Substance and Sexual Activity   Alcohol use: Never    Comment: occasionally   Drug use: Never   Sexual activity: Yes    Birth control/protection: None    Comment: declined condoms  Other Topics Concern   Not on file  Social History Narrative   ** Merged History Encounter **       Social Drivers of Health   Financial Resource Strain: Not on file  Food Insecurity: Food Insecurity Present (05/30/2020)   Hunger Vital Sign    Worried About Running Out of Food in the Last Year: Sometimes true    Ran Out of Food in the Last Year: Sometimes true  Transportation Needs: No Transportation Needs (05/30/2020)   PRAPARE - Administrator, Civil Service (Medical): No    Lack of Transportation (Non-Medical): No  Physical Activity: Not on file  Stress: Not on file  Social Connections: Not on file    Labs: Lab Results  Component Value Date   HIV1RNAQUANT NOT DETECTED 09/06/2023   HIV1RNAQUANT NOT DETECTED 06/25/2023   HIV1RNAQUANT <20 DETECTED (A) 05/20/2023   CD4TABS 818  08/29/2021   CD4TABS 776 04/11/2021   CD4TABS 527 06/06/2020    RPR and STI Lab Results  Component Value Date   LABRPR NON-REACTIVE 06/25/2023   LABRPR NON-REACTIVE 05/24/2022   LABRPR NON-REACTIVE 04/24/2022   LABRPR NON-REACTIVE 04/09/2022   LABRPR NON-REACTIVE 09/19/2021    STI Results GC CT  09/09/2022 11:00 AM Negative  Negative   05/24/2022 10:16 AM Negative  Negative   05/24/2022  9:45 AM Negative  Negative   04/24/2022 10:09 AM Negative  Negative   04/09/2022 11:26 AM Negative  Negative   09/19/2021 10:55 AM Negative  Negative   03/18/2020  6:56 PM Negative  Negative   12/18/2019  8:07 PM Negative  Negative   11/12/2019 10:54 AM Negative  Negative   11/05/2019 12:00 AM   Negative      03/17/2019 12:00 AM Negative  Negative      This result is from an external source.    Hepatitis B Lab Results  Component Value Date   HEPBSAB NON-REACTIVE 11/20/2022   HEPBSAG NON-REACTIVE 06/20/2020   HEPBCAB NON-REACTIVE 06/20/2020   Hepatitis C Lab Results  Component Value Date   HEPCAB NON-REACTIVE 06/20/2020   Hepatitis A Lab Results  Component Value Date   HAV REACTIVE (A) 11/12/2019   Lipids: Lab Results  Component Value Date   CHOL 201 (H) 09/06/2023   TRIG 179 (H) 09/06/2023   HDL 50 09/06/2023   CHOLHDL 4.0 09/06/2023   LDLCALC 121 (H) 09/06/2023    TARGET DATE:  The 29th of the month  Assessment: Miranda George presents today with interpreter for their maintenance Cabenuva  injections. Initial/past injections were tolerated well without issues. No problems with systemic effects of injections.   Administered cabotegravir  600mg /61mL in left upper outer quadrant of the gluteal muscle. Administered rilpivirine  900 mg/3mL in the right upper outer quadrant of the gluteal muscle. Monitored patient for 10 minutes after injection. Injections were tolerated well without issue. Patient will follow up in 2 months for next injection. Will check HIV RNA.  Eligible for flu, COVID, and 2/2 Shingles vaccinations; accepts flu and 2/2 Shingrix  vaccine.   Plan: - Cabenuva  injections administered - Check HIV RNA  - Administer flu and 2/2 Shingrix  vaccines  - Next injections scheduled for 12/23 with Dr. Overton and 2/23 with Cassie  - Call with any issues or questions  Alan Geralds, PharmD, CPP, BCIDP, AAHIVP Clinical Pharmacist Practitioner Infectious Diseases Clinical Pharmacist [facility]

## 2023-11-05 ENCOUNTER — Other Ambulatory Visit (HOSPITAL_COMMUNITY): Payer: Self-pay

## 2023-11-07 ENCOUNTER — Ambulatory Visit (INDEPENDENT_AMBULATORY_CARE_PROVIDER_SITE_OTHER): Admitting: Pharmacist

## 2023-11-07 ENCOUNTER — Other Ambulatory Visit: Payer: Self-pay

## 2023-11-07 DIAGNOSIS — B20 Human immunodeficiency virus [HIV] disease: Secondary | ICD-10-CM | POA: Diagnosis present

## 2023-11-07 DIAGNOSIS — Z23 Encounter for immunization: Secondary | ICD-10-CM | POA: Diagnosis not present

## 2023-11-07 MED ORDER — CABOTEGRAVIR & RILPIVIRINE ER 600 & 900 MG/3ML IM SUER
1.0000 | Freq: Once | INTRAMUSCULAR | Status: AC
  Administered 2023-11-07: 1 via INTRAMUSCULAR

## 2023-11-08 ENCOUNTER — Ambulatory Visit: Admitting: Pharmacist

## 2023-11-09 LAB — HIV-1 RNA QUANT-NO REFLEX-BLD
HIV 1 RNA Quant: NOT DETECTED {copies}/mL
HIV-1 RNA Quant, Log: NOT DETECTED {Log_copies}/mL

## 2023-12-13 ENCOUNTER — Other Ambulatory Visit (HOSPITAL_COMMUNITY): Payer: Self-pay

## 2023-12-13 ENCOUNTER — Other Ambulatory Visit: Payer: Self-pay

## 2023-12-13 NOTE — Progress Notes (Signed)
 Specialty Pharmacy Refill Coordination Note  Miranda George is a 43 y.o. female assessed today regarding refills of clinic administered specialty medication(s) Cabotegravir  & Rilpivirine  (CABENUVA )   Clinic requested Courier to Provider Office   Delivery date: 12/26/23   Verified address: [address] [place] [address]   Medication will be filled on 12/25/23.

## 2023-12-25 ENCOUNTER — Other Ambulatory Visit: Payer: Self-pay

## 2023-12-26 ENCOUNTER — Telehealth: Payer: Self-pay

## 2023-12-26 NOTE — Telephone Encounter (Signed)
 [facility] Patient Advocate Encounter  Patient's medications CABENUVA  have been couriered to [facility] from [facility] and will be administered at the patients appointment on 01/07/24.  Charmaine Sharps, CPhT Specialty Pharmacy Patient [facility] for Infect[facility] Disease Phone: [phone] Fax:  [phone]

## 2024-01-07 ENCOUNTER — Encounter: Payer: Self-pay | Admitting: Internal Medicine

## 2024-01-07 ENCOUNTER — Ambulatory Visit: Admitting: Internal Medicine

## 2024-01-07 ENCOUNTER — Other Ambulatory Visit: Payer: Self-pay

## 2024-01-07 VITALS — BP 142/72 | HR 62 | Temp 98.0°F | Resp 16 | Wt 208.0 lb

## 2024-01-07 DIAGNOSIS — E669 Obesity, unspecified: Secondary | ICD-10-CM | POA: Diagnosis not present

## 2024-01-07 DIAGNOSIS — Z6841 Body Mass Index (BMI) 40.0 and over, adult: Secondary | ICD-10-CM | POA: Diagnosis not present

## 2024-01-07 DIAGNOSIS — E782 Mixed hyperlipidemia: Secondary | ICD-10-CM

## 2024-01-07 DIAGNOSIS — B20 Human immunodeficiency virus [HIV] disease: Secondary | ICD-10-CM | POA: Diagnosis present

## 2024-01-07 DIAGNOSIS — E785 Hyperlipidemia, unspecified: Secondary | ICD-10-CM

## 2024-01-07 MED ORDER — ATORVASTATIN CALCIUM 40 MG PO TABS: 40.0000 mg | ORAL_TABLET | Freq: Every day | ORAL | 11 refills | Status: AC

## 2024-01-07 MED ORDER — CABOTEGRAVIR & RILPIVIRINE ER 600 & 900 MG/3ML IM SUER
1.0000 | Freq: Once | INTRAMUSCULAR | Status: AC
  Administered 2024-01-07: 1 via INTRAMUSCULAR

## 2024-01-07 NOTE — Addendum Note (Signed)
 Addended by: Vinie Charity M on: 01/07/2024 10:36 AM   Modules accepted: Orders

## 2024-01-07 NOTE — Patient Instructions (Addendum)
 On your next visit with our pharmacy team. Please make sure you have these labs:   Cd4 Cbc Cmp  Start taking atorvastatin  40 mg daily -- this is to reduce risk heart attack and stroke in people living with hiv   I'll see you in 1 year otherwise

## 2024-01-07 NOTE — Progress Notes (Signed)
 "       [facility]  Patient Active Problem List   Diagnosis Date Noted   Postpartum care following cesarean delivery 05/30/2020   History of cesarean section 05/02/2020   Transient hypertension of pregnancy in third trimester 04/21/2020   Gestational diabetes 02/19/2020   Language barrier 11/13/2019   HIV (human immunodeficiency virus infection) (HCC) 11/13/2019   History of C-section 11/13/2019      Subjective:    Patient ID: Miranda George, female    DOB: 12/09/1980, 43 y.o.   MRN: [id number]  Cc: HIV care  HPI:  Miranda George is a 43 y.o. female here for continued HIV care  01/07/24 id clinic f/u No complaint Due for her 2 months cabenuva  She is same relationship with husband She has no concern today She doesn't want to do std testing We discussed reprieve trial today and she is open to taking statin    06/25/23 id clinic f/u Patient was doing well on cabenuva  and she had a lot of traveling and she tried to call front desk to reschedule but couldn't. She was given biktarvy  to take pending repeat genosure 05/2023 and that was fine She wants to be back on injection No other complaint She was going to [place] [facility] for chronic bilateral groin pain since giving birth to her child -- unclear cause. She takes some kind of medication for this but hasn't been as didn't think it was helping. No missed dose biktarvy  the last 2 weeks  She also has recent pap smear near [place] and said it was normal  She has a pcp in [place]    09/20/22 id clinic f/u Doing well on cabenuva  started a few months ago 08/2022 urine gc/chlam negative 07/2022 hiv rna negative No concern with her health today   Also has lower left abd pain -- 1 month and an ultrasound was done in ED and suggestive of uterine fibroid so pending gynecology    04/09/22 id clinic f/u 2 months lower back back no red flag and worse with activity not  waking her up at night No b sx Feels well otherwise Remains in same marriage Baby doing well Compliant with biktarvy  We discussed cabenuva  and she is interested. She has medicaid She was referred to primary care previously but didn't keep appointment Needs pap/mammogram  09/19/21 id clinic Doing well  No complaint Her husband's hiv status is still a mystery to her -- they don't talk about this  She has no missed dose of biktarvy  Her viral load is <20  She has no sx but ok with std testing    05/09/21 id f/u Headache is not as persistent now but still intense. She currently takes 2 times a week. Intensity is 7-8. Frontal/sinus distribution/post-orbital. Some light sensitivity. Episode lasts average 2-3 hours but some times could last the whole day. No focal weakness/numbness Discuss if she misses her biktarvy  doses and she said no    04/11/21 id f/u She is here with language interpreter and her baby (40 months old)  Patient compliant with biktarvy  Complains of 1 month of headache. Frontal. 7/10. Tylenol  helps. No worsening factor. No visual change/numbness/tingling/weakness. Sometimes dizzi. Some time with chest discomfort. No jaw pain. No hearing disturbance.  Not positional Not worse with straining/coughing Sudden onset No new medication No birth control pill No nasal congestion Some times light sensitivity; no noise sensitivity No fever, chill No smoker No sinus congestion  Eating  drinking ok Drink coffee. 1 cup a day in morning for several years. Doesn't make headache worse/better  Patient no hx blood clot No family hx migraine  I reviewed her meds list -- Tylenol  prn. biktarvy    9/27 id f/u No longer on metformin  Takes biktarvy  Virologically and immunologically well controlled No missed biktarvy  dose last 4 weeks Baby boy is 5 months now, lives with mom (and husband, and older son). No issue. Not breast feeding. Seronegative No f/c, headache, rash, joint pain,  vaginal discharge/pelvic pain Reports upper epigastric pain radiate to both sides -- since taking biktarvy  Some heart burn No pcp yet -- trying to get internal medicine clinic here as pcp.  Doesn't take acid blocker Milk gives her some relief Denies taking nsaids  Last well woman exam/pap during pregnancy. She states she wants to do it here  Husband told her he tested twice and negative. Patient without other sexual exposure   06/20/20 id f/u Patient delivered healthy child 05/2020 Her hiv viral load has been negative several months, on ART, prior to delivery She continues to be compliant with ART with no missed dose Reviewed labs with her Her lft is a little elevated on 06/06/2020; previously exposed to hep a; 10/2019 hep b and c serology negative. She had no issues with peripartum hypertension or HELLP I reviewed her immunization record; only one heplisav dose 12/2019. She doesn't recall getting the second dose yet She is without n/v/diarrhea/abd pain She does feel dizzy since delivery of her child; feels unsteady at times as well but no dyspnea/fatigue. She endorses turning head in any direction makes it worse.  She has good appetite She doesn't breast feed because of the hiv  She is here with her newborn boy of 1 month. His prelim hiv test is negative; he is not on any hiv pill; he'll have repeat hiv testing again soon  Social review --  Lives with her husband Husband: hasn't repeated testing yet -- worried about not being able to pay Substance use: no smoking, drinking, drugs Work: housewife  03/07/20 id f/u Doing well Good prenatal care Gestational diabetes -- advised diet/exercise; no oral or insulin yet No f/c No vaginal bleeding/discharge No rash Husband still not getting hiv testing repeat/care yet; patient tried to call [facility] but no information yet Compliant with truvada /tivicay  Reviewed labs; hiv undetectable   background -------------  She was seen in the ED on  12/03 for abd pain and vaginal bleeding, briefly admitted and dx'ed with subchorionic hematoma. Gc/chlamydia, wet mount negative. The vaginal bleeding had stopped yesterday, and the abdominal pain had also gone  #HIV Dx'ed 10/2019; heterosexual; denies ivdu/promiscuity; patient is married Patient had some blood transfusion during her 2nd pregnancy Husband tested and told her he was negative 2 months ago although there is some question as he tried to call back to the clinic and haven't heard Patient married 1 year; together for 4 years She was seen in [facility] on 10/28 with our pharmacist Cassie and started on tivicay  and truvada . She had some nausea at the beginning but no longer. She doesn't miss any dose. 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, v179I/V, R211K PR mutation -- I62v, L63p, v77I/V, I93L There were no phenotypic resistance to NRTI, nnrti, or PI Baseline HIV labs: [id number] negative 11/12/2019 hepatitis b sAb, sAg, cAb; hep cAb negative; hep A Ab positive 11/12/2019 quant gold negative  She has fatigue since pregnancy and  that hasn't changed She also endorsed some mild depression but no hi/si (because of positive hiv)  No fever, chill, nightsweat No headache, visual change, blurriness No cough, chest pain, sob No sore throat No diarrhea No urinary urgency, dysuria, flank pain No bruising No joint pain, myalgia, muscle weakness No focal weakness No rash   No Known Allergies    Outpatient Medications Prior to Visit  Medication Sig Dispense Refill   cabotegravir  & rilpivirine  ER (CABENUVA ) 600 & 900 MG/3ML injection Inject 1 kit into the muscle every 2 (two) months. 6 mL 5   ACCU-CHEK GUIDE test strip USE TO CHECK BLOOD SUGAR 4 TIMES DAILY (Patient not taking: Reported on 01/07/2024)     Accu-Chek Softclix Lancets lancets 4 (four) times daily. (Patient not taking: Reported on 01/07/2024)     acetaminophen  (TYLENOL )  500 MG tablet Take 2 tablets (1,000 mg total) by mouth every 8 (eight) hours. (Patient not taking: Reported on 01/07/2024) 30 tablet 0   HYDROcodone -acetaminophen  (NORCO/VICODIN) 5-325 MG tablet Take 1 tablet by mouth every 4 (four) hours as needed. (Patient not taking: Reported on 01/07/2024) 10 tablet 0   ibuprofen  (ADVIL ) 600 MG tablet Take 1 tablet (600 mg total) by mouth every 6 (six) hours as needed. (Patient not taking: Reported on 01/07/2024) 30 tablet 0   simethicone  (MYLICON) 80 MG chewable tablet Chew 1 tablet (80 mg total) by mouth 3 (three) times daily after meals. (Patient not taking: Reported on 01/07/2024) 30 tablet 0   No facility-administered medications prior to visit.     Past Medical History:  Diagnosis Date   Anemia    Blood transfusion without reported diagnosis    Cholestasis during pregnancy    Fibroid    GERD (gastroesophageal reflux disease)    Gestational diabetes    HIV (human immunodeficiency virus infection) (HCC)       Past Surgical History:  Procedure Laterality Date   APPENDECTOMY     18 years ago   APPENDECTOMY     CESAREAN SECTION  2012   CESAREAN SECTION     CESAREAN SECTION N/A 05/02/2020   Procedure: CESAREAN SECTION;  Surgeon: Kandis Devaughn Sayres, MD;  Location: [facility];  Service: Obstetrics;  Laterality: N/A;      Family History  Problem Relation Age of Onset   Hypertension Mother    Diabetes Mother    Thyroid  disease Mother    Hypertension Father    Diabetes Father       Social History   Socioeconomic History   Marital status: Married    Spouse name: Not on file   Number of children: Not on file   Years of education: Not on file   Highest education level: Not on file  Occupational History   Not on file  Tobacco Use   Smoking status: Never   Smokeless tobacco: Never  Vaping Use   Vaping status: Never Used  Substance and Sexual Activity   Alcohol use: Never    Comment: occasionally   Drug use: Never   Sexual  activity: Yes    Birth control/protection: None    Comment: declined condoms  Other Topics Concern   Not on file  Social History Narrative   ** Merged History Encounter **       Social Drivers of Health   Tobacco Use: Low Risk (01/07/2024)   Patient History    Smoking Tobacco Use: Never    Smokeless Tobacco Use: Never    Passive Exposure: Not on  Actuary Strain: Not on file  Food Insecurity: Not on file  Transportation Needs: Not on file  Physical Activity: Not on file  Stress: Not on file  Social Connections: Not on file  Intimate Partner Violence: Not on file  Depression (PHQ2-9): Low Risk (06/25/2023)   Depression (PHQ2-9)    PHQ-2 Score: 2  Alcohol Screen: Not on file  Housing: Not on file  Utilities: Not on file  Health Literacy: Not on file           Objective:    BP (!) 142/72   Pulse 62   Temp 98 F (36.7 C) (Oral)   Resp 16   Wt 208 lb (94.3 kg)   SpO2 97%   BMI 40.62 kg/m  Nursing note and vital signs reviewed.  Physical Exam General/constitutional: no distress, pleasant; obese HEENT: Normocephalic, PER, Conj Clear, EOMI, Oropharynx clear Neck supple CV: rrr no mrg Lungs: clear to auscultation, normal respiratory effort Abd: Soft, Nontender Ext: no edema Skin: No Rash Neuro: nonfocal MSK: no peripheral joint swelling/tenderness/warmth; back spines nontender         Labs: Reviewed Lab Results  Component Value Date   WBC 6.4 06/25/2023   HGB 12.7 06/25/2023   HCT 39.1 06/25/2023   MCV 89.1 06/25/2023   PLT 250 06/25/2023   Last metabolic panel Lab Results  Component Value Date   GLUCOSE 76 06/25/2023   NA 138 06/25/2023   K 3.9 06/25/2023   CL 104 06/25/2023   CO2 26 06/25/2023   BUN 9 06/25/2023   CREATININE 0.74 06/25/2023   GFRNONAA >60 09/09/2022   GFRAA 117 06/20/2020   CALCIUM  9.4 06/25/2023   PROT 7.3 06/25/2023   ALBUMIN 3.6 09/09/2022   LABGLOB 2.9 04/21/2020   AGRATIO 1.2 04/21/2020    BILITOT 0.5 06/25/2023   ALKPHOS 61 09/09/2022   AST 44 (H) 06/25/2023   ALT 54 (H) 06/25/2023   ANIONGAP 10 09/09/2022    Lab Results  Component Value Date   HIV1RNAQUANT NOT DETECTED 11/07/2023   Lab Results  Component Value Date   CD4TCELL 48 06/25/2023   CD4TABS 818 08/29/2021     Hiv                cd4 (%)      /    hiv vl 05/2023                           /     <20 07/2022                           /     <20 08/2021           818 (46)   /     <20 04/11/21                           /    41 10/11/20                           /    ud 02/14                                /    <20 12/28  340 (32)    /     32k  Serology: 08/2022 urine gc/chlam negative 05/2022 rpr negative 03/2021 rpr nonreactive 12/18/19 urine gc/chlam and wetmount negative 10/28 quant gold negative; hep b/c negative; hep a reactive; rpr nonreactive 10/28 hla [id number] negative 10/28 hiv genotype no phenotypic resistance    Imaging: 04/22/21 brain mri FINDINGS: Brain: No acute infarct, mass effect or extra-axial collection. No acute or chronic hemorrhage. Normal white matter signal, parenchymal volume and CSF spaces. The midline structures are normal.   Vascular: Major flow voids are preserved.   Skull and upper cervical spine: Normal calvarium and skull base. Visualized upper cervical spine and soft tissues are normal.   Sinuses/Orbits:No paranasal sinus fluid levels or advanced mucosal thickening. No mastoid or middle ear effusion. Normal orbits.   IMPRESSION: Normal brain MRI.   Assessment & Plan:   Patient Active Problem List   Diagnosis Date Noted   Postpartum care following cesarean delivery 05/30/2020   History of cesarean section 05/02/2020   Transient hypertension of pregnancy in third trimester 04/21/2020   Gestational diabetes 02/19/2020   Language barrier 11/13/2019   HIV (human immunodeficiency virus infection) (HCC) 11/13/2019   History of C-section 11/13/2019      Problem List Items Addressed This Visit   None  #hiv dx'ed during pregnancy. Suspect transmission from husband. No other risk factors As of this visit she is not sure of her husband's hiv status. I have advised her to have an open discussion with her husband to get retested. 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 No evidence IRIS. Baseline cd4 prior to med 340 (32%)  Lab Results  Component Value Date   HIV1RNAQUANT NOT DETECTED 11/07/2023   Lab Results  Component Value Date   CD4TCELL 48 06/25/2023   CD4TABS 818 08/29/2021   Tolerating/compliant with biktarvy  Doing well on biktarvy   04/09/22 discussed cabenuva  and she is interested --> doing well on it since and continue to be virologically controlled   06/25/23 was on cabenuva  but missed dose due to busy schedule and unable to reschedule -- bridged with biktarvy . Genosure no resistance. Wants back on cabenuva   01/07/24 id clinic assessment Doing well on cabenuva  Labs looking good virologically controlled.   -discussed u=u -encourage compliance -continue current HIV medication biktarvy  until pharmacy can see her for cabenuva  -labs cd4 next year -f/u with me in 1 year -- cabenuva  with pharmacy every 2 months otherwise     #hcm -vaccination prevnar 11/12/19 Pneumovax 06/2020 tdap 01/2020 covid moderna 2 shots by 07/2019 Meningitis booster 07/2022 -hepatitis heplisav started 12/2019 finished 06/2020 -- repeat hep b sAb 09/2020 213 -std screening 08/2022 urine gc/chlam negative; 05/2022 rpr negative 06/25/23 rpr and urine gc/chlam 12/2023 patient asked to defer -annual 10/2019 quant gold negative 06/2020 rpr negative -[facility] Pcp in [place] Recent pap smear 05/2023 ?reflex hpv and colpsocopy was normal --> plan for repeat pap in a year -metabolic/reprieve Discuss reprieve  trial 12/2023; starting lipitor 40 mg daily. Will ask our pharmacy team to recheck cbc/cmp and make sure no side effect with lipitor    Follow-up: No follow-ups on file.     Constance ONEIDA Passer, MD [facility] -- -- pager   [phone] cell 01/07/2024, 10:11 AM "

## 2024-01-28 NOTE — Progress Notes (Signed)
 The 10-year ASCVD risk score (Arnett DK, et al., 2019) is: 0.9%   Values used to calculate the score:     Age: 44 years     Clinically relevant sex: Female     Is Non-Hispanic African American: No     Diabetic: No     Tobacco smoker: No     Systolic Blood Pressure: 142 mmHg     Is BP treated: No     HDL Cholesterol: 50 mg/dL     Total Cholesterol: 201 mg/dL  Duwaine Lowe, BSN, RN

## 2024-03-09 ENCOUNTER — Ambulatory Visit: Admitting: Pharmacist
